# Patient Record
Sex: Female | Born: 1950
Health system: Southern US, Community
[De-identification: ages and names within clinical notes are randomized; demographics above are authoritative.]

## PROBLEM LIST (undated history)

## (undated) DIAGNOSIS — R7303 Prediabetes: Secondary | ICD-10-CM

## (undated) DIAGNOSIS — E785 Hyperlipidemia, unspecified: Secondary | ICD-10-CM

## (undated) DIAGNOSIS — F32A Depression, unspecified: Secondary | ICD-10-CM

## (undated) DIAGNOSIS — E079 Disorder of thyroid, unspecified: Secondary | ICD-10-CM

## (undated) DIAGNOSIS — E559 Vitamin D deficiency, unspecified: Secondary | ICD-10-CM

## (undated) DIAGNOSIS — C801 Malignant (primary) neoplasm, unspecified: Secondary | ICD-10-CM

## (undated) DIAGNOSIS — K635 Polyp of colon: Secondary | ICD-10-CM

## (undated) DIAGNOSIS — I10 Essential (primary) hypertension: Secondary | ICD-10-CM

## (undated) DIAGNOSIS — D649 Anemia, unspecified: Secondary | ICD-10-CM

## (undated) DIAGNOSIS — F419 Anxiety disorder, unspecified: Secondary | ICD-10-CM

## (undated) DIAGNOSIS — K219 Gastro-esophageal reflux disease without esophagitis: Secondary | ICD-10-CM

## (undated) DIAGNOSIS — F329 Major depressive disorder, single episode, unspecified: Secondary | ICD-10-CM

## (undated) HISTORY — PX: MOHS SURGERY: SHX181

## (undated) HISTORY — DX: Hyperlipidemia, unspecified: E78.5

## (undated) HISTORY — DX: Anemia, unspecified: D64.9

## (undated) HISTORY — DX: Essential (primary) hypertension: I10

## (undated) HISTORY — DX: Major depressive disorder, single episode, unspecified: F32.9

## (undated) HISTORY — DX: Gastro-esophageal reflux disease without esophagitis: K21.9

## (undated) HISTORY — PX: THYROIDECTOMY: SHX17

## (undated) HISTORY — DX: Polyp of colon: K63.5

## (undated) HISTORY — DX: Depression, unspecified: F32.A

## (undated) HISTORY — DX: Disorder of thyroid, unspecified: E07.9

## (undated) HISTORY — DX: Vitamin D deficiency, unspecified: E55.9

## (undated) HISTORY — DX: Malignant (primary) neoplasm, unspecified: C80.1

## (undated) HISTORY — PX: COLONOSCOPY: SHX174

## (undated) HISTORY — PX: TONSILLECTOMY: SUR1361

## (undated) HISTORY — DX: Prediabetes: R73.03

## (undated) HISTORY — PX: BREAST LUMPECTOMY: SHX2

## (undated) HISTORY — DX: Anxiety disorder, unspecified: F41.9

---

## 1992-12-30 HISTORY — PX: VAGINAL HYSTERECTOMY: SUR661

## 1998-07-27 ENCOUNTER — Other Ambulatory Visit: Admission: RE | Admit: 1998-07-27 | Discharge: 1998-07-27 | Payer: Self-pay | Admitting: Obstetrics and Gynecology

## 1998-12-30 HISTORY — PX: KNEE ARTHROSCOPY: SHX127

## 2000-12-30 HISTORY — PX: BREAST EXCISIONAL BIOPSY: SUR124

## 2001-03-05 ENCOUNTER — Ambulatory Visit (HOSPITAL_COMMUNITY): Admission: RE | Admit: 2001-03-05 | Discharge: 2001-03-05 | Payer: Self-pay | Admitting: Family Medicine

## 2001-03-05 ENCOUNTER — Encounter: Payer: Self-pay | Admitting: Internal Medicine

## 2001-03-10 ENCOUNTER — Encounter: Admission: RE | Admit: 2001-03-10 | Discharge: 2001-06-08 | Payer: Self-pay | Admitting: Internal Medicine

## 2001-08-27 ENCOUNTER — Other Ambulatory Visit: Admission: RE | Admit: 2001-08-27 | Discharge: 2001-08-27 | Payer: Self-pay | Admitting: Obstetrics and Gynecology

## 2002-10-14 ENCOUNTER — Encounter: Payer: Self-pay | Admitting: Internal Medicine

## 2002-10-14 ENCOUNTER — Ambulatory Visit (HOSPITAL_COMMUNITY): Admission: RE | Admit: 2002-10-14 | Discharge: 2002-10-14 | Payer: Self-pay | Admitting: Internal Medicine

## 2002-10-19 ENCOUNTER — Ambulatory Visit (HOSPITAL_COMMUNITY): Admission: RE | Admit: 2002-10-19 | Discharge: 2002-10-19 | Payer: Self-pay | Admitting: Internal Medicine

## 2002-10-19 ENCOUNTER — Encounter: Payer: Self-pay | Admitting: Internal Medicine

## 2003-12-31 LAB — HM COLONOSCOPY

## 2004-03-15 ENCOUNTER — Ambulatory Visit (HOSPITAL_COMMUNITY): Admission: RE | Admit: 2004-03-15 | Discharge: 2004-03-15 | Payer: Self-pay | Admitting: Internal Medicine

## 2005-03-22 ENCOUNTER — Ambulatory Visit (HOSPITAL_COMMUNITY): Admission: RE | Admit: 2005-03-22 | Discharge: 2005-03-22 | Payer: Self-pay | Admitting: Internal Medicine

## 2006-03-18 ENCOUNTER — Other Ambulatory Visit: Admission: RE | Admit: 2006-03-18 | Discharge: 2006-03-18 | Payer: Self-pay | Admitting: Obstetrics & Gynecology

## 2006-03-21 ENCOUNTER — Ambulatory Visit (HOSPITAL_COMMUNITY): Admission: RE | Admit: 2006-03-21 | Discharge: 2006-03-21 | Payer: Self-pay | Admitting: Obstetrics and Gynecology

## 2006-03-25 ENCOUNTER — Ambulatory Visit (HOSPITAL_COMMUNITY): Admission: RE | Admit: 2006-03-25 | Discharge: 2006-03-25 | Payer: Self-pay | Admitting: Internal Medicine

## 2007-03-18 IMAGING — US US ABDOMEN COMPLETE
1 series · 13 of 25 positions shown · non-contrast
Comparison: none

CLINICAL DATA: Reported history of hemangioma. Epigastric pain.  Evaluate for gallstones.  
ABDOMINAL ULTRASOUND:
Gallbladder:  Normal.  No stones or gallbladder wall thickening.
Common bile duct:  Normal.  
Liver:  Large hyperechoic lesion is identified in the right hepatic lobe, at the capsule.  This measures 6.2 x 5.3 x 5.7 cm and elongates into a more oval shape on some images.  Reviewing the patient?s CT scan from 10/19/02, this demonstrated a large peripherally enhancing lesion in the posterior right liver (mainly involving segment 7).  A second smaller lesion was noted in the anterior aspect of the inferior right liver.  This lesion at CT is identified on ultrasound today.
Inferior vena cava:  Unremarkable.
Pancreas:  Obscured by overlying bowel gas.  
Spleen:  Normal.
Kidneys:  Normal.
Aorta:  Nondilated.

[Series 1: us abdomen complete · 0.35mm/px · 13 of 90 slices shown]
[im 1/90]
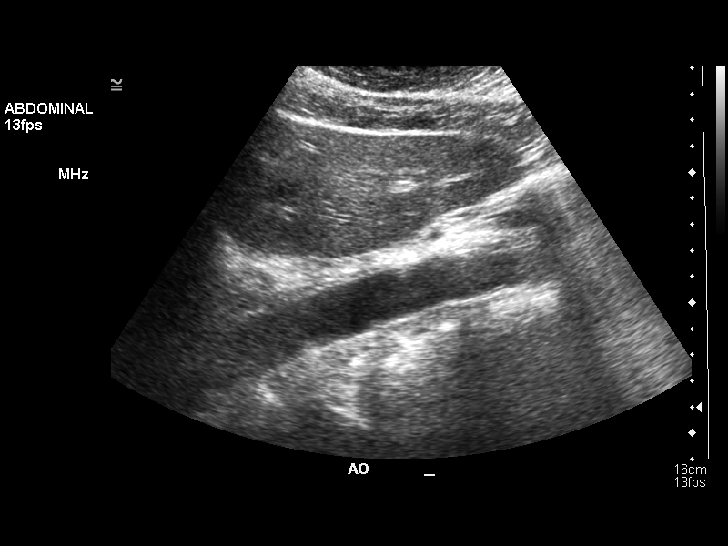
[im 8/90]
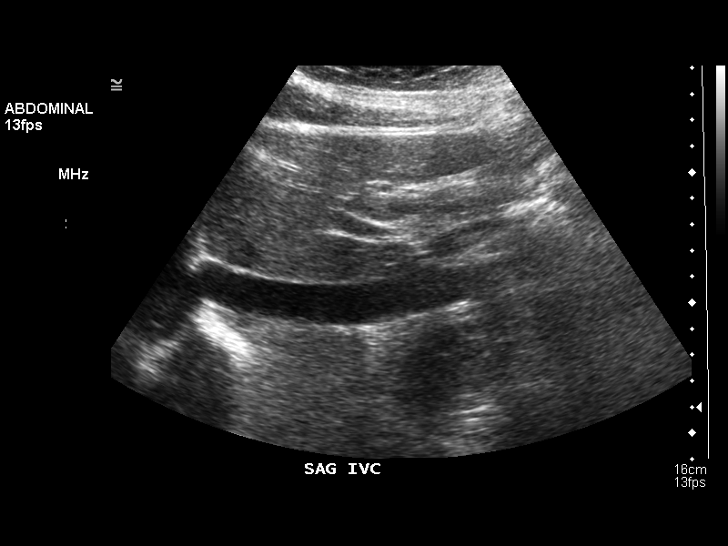
[im 15/90]
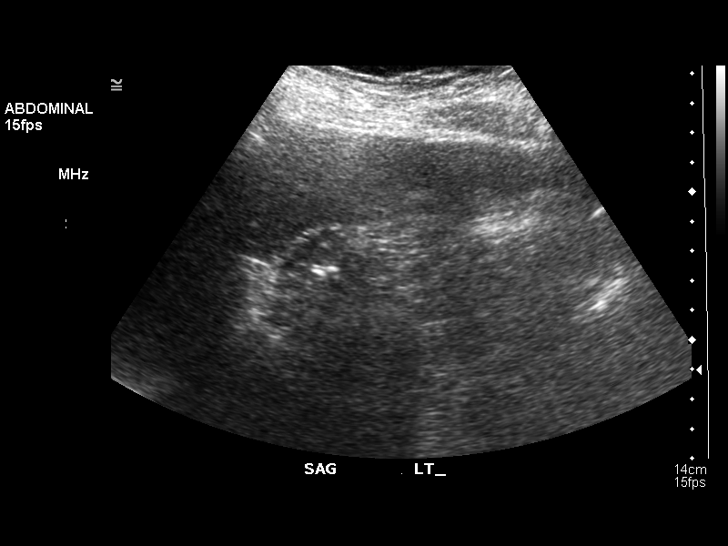
[im 23/90]
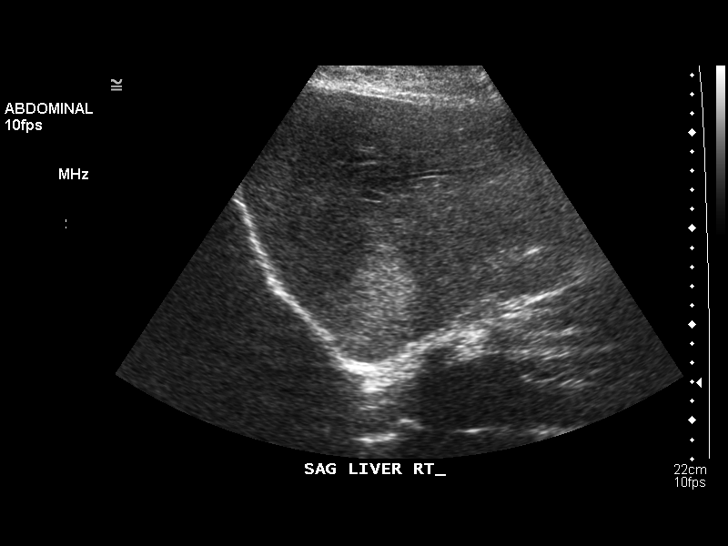
[im 30/90]
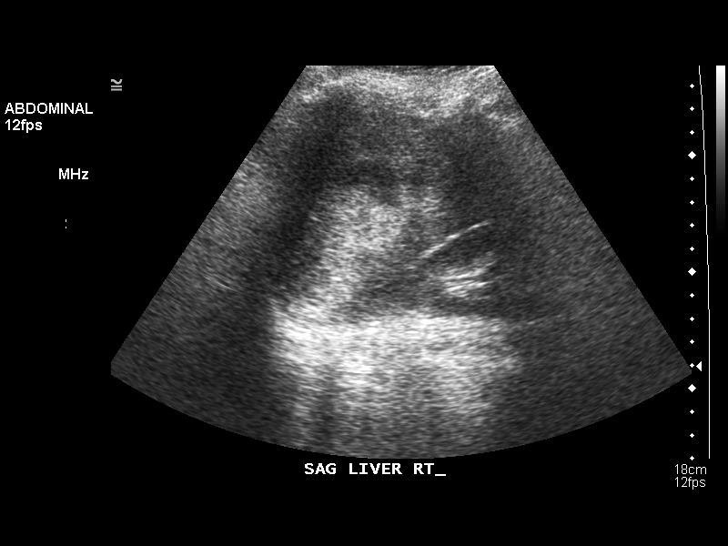
[im 38/90]
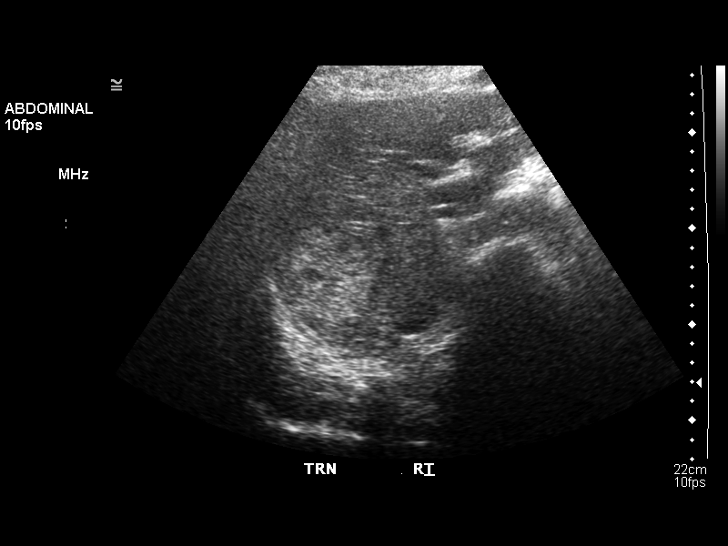
[im 45/90]
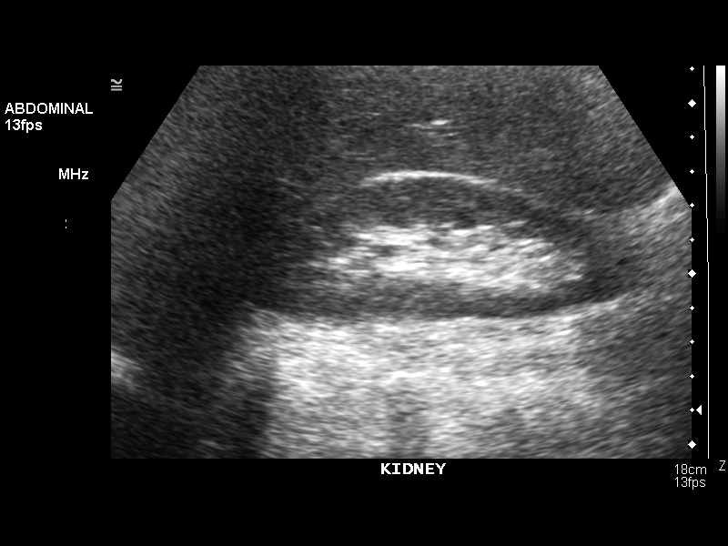
[im 52/90]
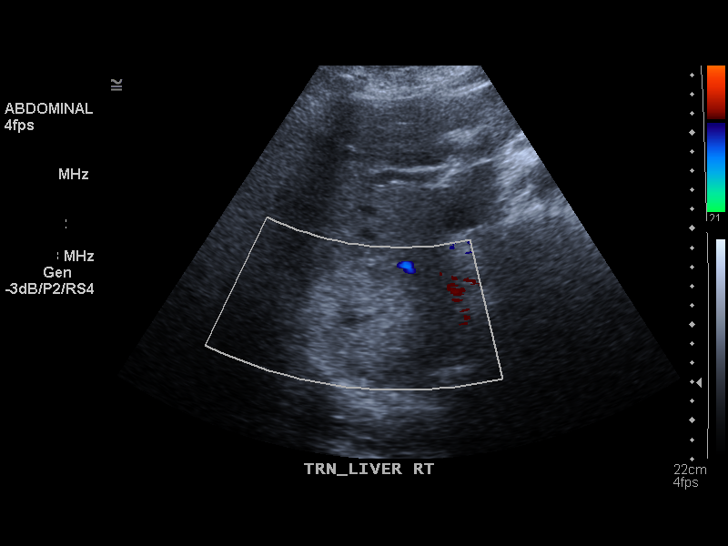
[im 60/90]
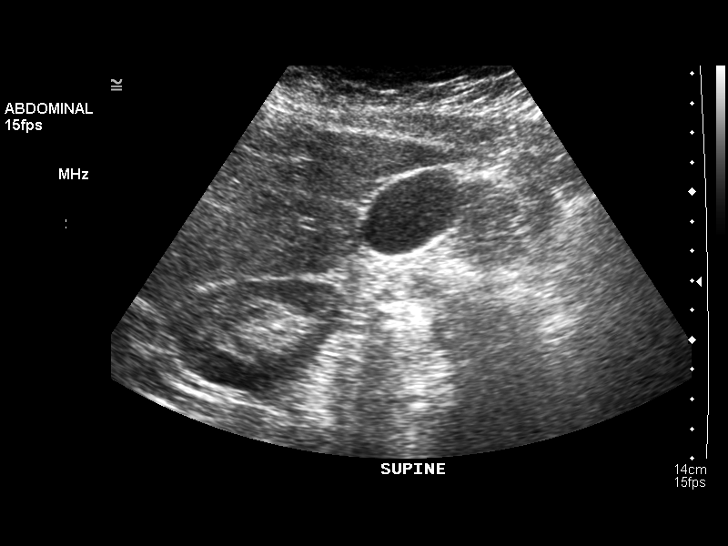
[im 67/90]
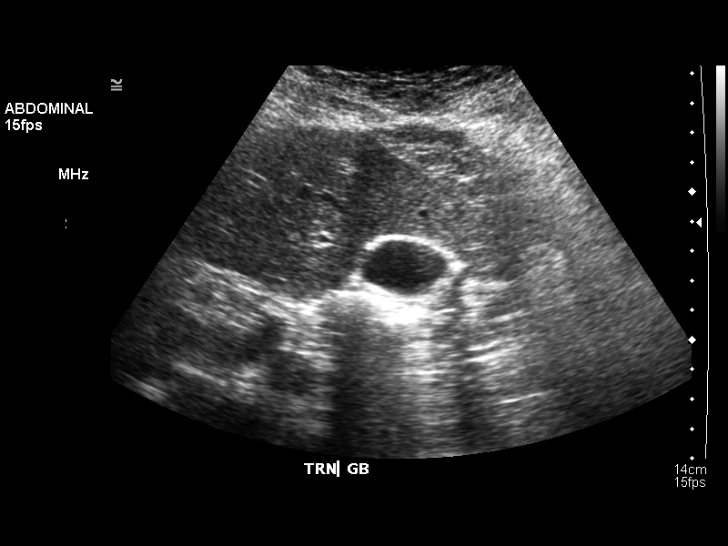
[im 75/90]
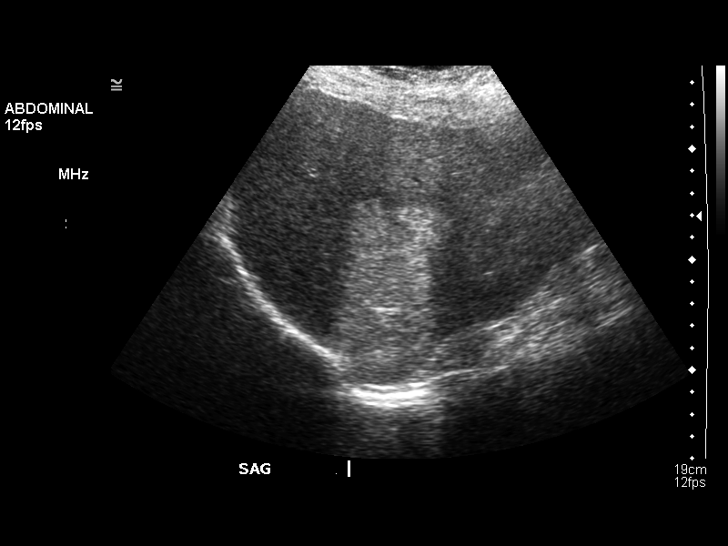
[im 82/90]
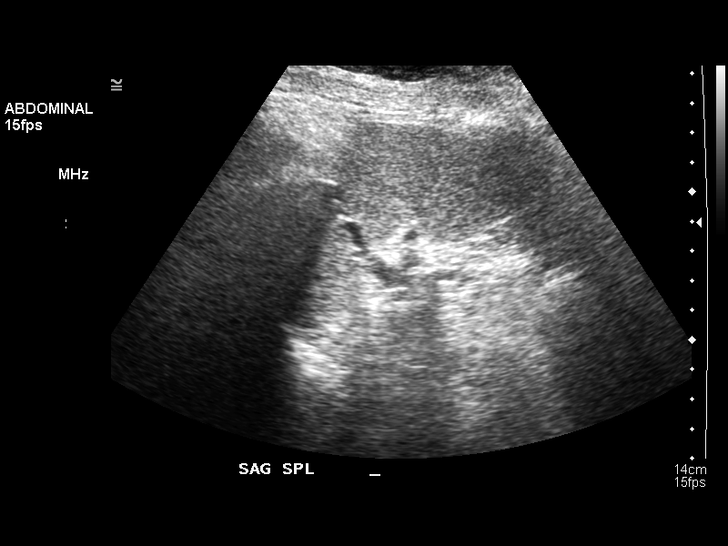
[im 90/90]
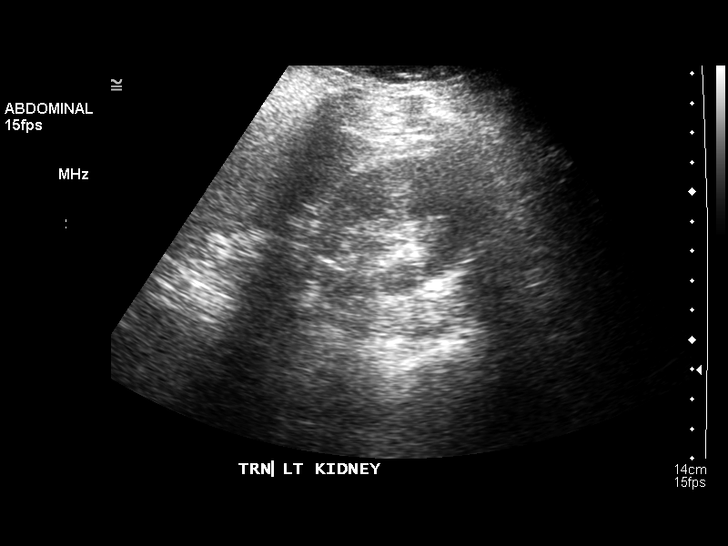

[13 of 25 positions shown; findings below may reference images not displayed]

IMPRESSION: 1.  No evidence for cholelithiasis.  
2.  Large echogenic lesion in the posterior right liver, at the capsule.  Review of the CT scan from 3992 demonstrates peripheral enhancement although not typically as nodular as classically seen for cavernous hemangioma.  Given the relative stability over the 2 ? years, this is most likely a benign etiology such as hemangioma although FNH would also be a consideration.  If the patient is having pain, there have been case reports of giant cavernous hemangioma causing right upper quadrant pain.  dedicated hepatic MRI is recommended as this would probably be the most helpful.

## 2007-08-17 DIAGNOSIS — C4491 Basal cell carcinoma of skin, unspecified: Secondary | ICD-10-CM

## 2007-08-17 HISTORY — DX: Basal cell carcinoma of skin, unspecified: C44.91

## 2009-02-21 ENCOUNTER — Encounter (INDEPENDENT_AMBULATORY_CARE_PROVIDER_SITE_OTHER): Payer: Self-pay | Admitting: *Deleted

## 2009-06-16 ENCOUNTER — Encounter (INDEPENDENT_AMBULATORY_CARE_PROVIDER_SITE_OTHER): Payer: Self-pay | Admitting: General Surgery

## 2009-06-16 ENCOUNTER — Ambulatory Visit (HOSPITAL_COMMUNITY): Admission: RE | Admit: 2009-06-16 | Discharge: 2009-06-16 | Payer: Self-pay | Admitting: General Surgery

## 2011-04-08 LAB — BASIC METABOLIC PANEL
BUN: 11 mg/dL (ref 6–23)
CO2: 28 mEq/L (ref 19–32)
Calcium: 9.2 mg/dL (ref 8.4–10.5)
Chloride: 106 mEq/L (ref 96–112)
Creatinine, Ser: 0.81 mg/dL (ref 0.4–1.2)
Glucose, Bld: 102 mg/dL — ABNORMAL HIGH (ref 70–99)

## 2011-05-17 NOTE — Op Note (Signed)
NAME:  Cheyenne Conway, Cheyenne Conway NO.:  [id number]   MEDICAL RECORD NO.:  [id number]          PATIENT TYPE:  AMB   LOCATION:  DAY                          FACILITY[facility]  [facility]   PHYSICIAN:  Mare Loan, MD      DATE OF BIRTH:  1951/07/10   DATE OF PROCEDURE:  07/16/2009  DATE OF DISCHARGE:                               OPERATIVE REPORT   PREOPERATIVE DIAGNOSIS:  Left nipple discharge, likely papilloma.   POSTOPERATIVE DIAGNOSIS:  Left nipple discharge, likely papilloma.   PROCEDURE:  Excision of left breast tissue.   SURGEON:  Ronnald Collum, M.D.   ASSISTANT:  OR nurse.   ANESTHESIA:  General endotracheal anesthesia.   FINDINGS:  Nipple discharge from the left seen in the upper outer  quadrant.   SPECIMEN:  Left breast tissue.  No immediate complications.  No drains  were placed.   INDICATIONS FOR PROCEDURE:  Ms. Gammon is a 60 year old female who had  spontaneous nipple discharge from the left.  At times it was bloody.  She had a workup at [facility] including ductogram which revealed  the filling defect just beneath the nipple.  No abnormality was seen on  mammogram or ultrasound.  In clinic, I had expressed serosanguineous  fluid from the left breast in the upper outer quadrant.  Due to the  filling defect and the concern for papilloma and the small chance of an  associated carcinoma, informed consent was obtained for excision of left  breast tissue.   DETAILS OF PROCEDURE:  Ms. Demers was identified in the preoperative  holding area.  I examined her left breast and again elicited discharge  from the left breast.  It did seemed like it was coming from both sides  of the breast.  I talked to the patient about performing removal of  tissue just beneath the nipple with the small likelihood of having  decreased sensation in the nipple.  She was taken from the preoperative  holding area to the operating room.  Once in the operating room, she was  placed in the  supine position.  Spontaneous compression devices were  placed on the bilateral lower extremities.  Her left breast was prepped  and draped in the usual sterile fashion.  A time-out procedure  indicating the patient and the procedure was performed.  The left breast  was examined by me and I have asked Dr. Hassell Done to assist.  She had  discharge from the left nipple.  It did seem to come lateral medial.  We  could not identify a prominent duct.  Since I had elicited multiple  times on the lateral aspect of the breast, I performed an incision in  the upper outer segment of the areola.  Using Marcaine, I injected the  tissues.  Using a 15 blade, I incised the nipple.  I then used  electrocautery and performed excision of the breast tissue skiving  underneath the nipple and then performed an excision of the breast  tissue just underneath the nipple.  There did seem to be a prominent  duct just underneath the nipple.  I placed a stitch at this mark.  This  also marked the area just underneath the nipple.  The specimen was  removed intact with a long suture lateral and short superior.  The  specimen was passed off the operative field.  I irrigated the wound bed.  A small amount of bleeding was controlled with electrocautery.  I closed  in layers using 3-0 Vicryl.  The dermis was closed with 3-0 Vicryl. The  skin was closed with 4-0 Monocryl.  I  injected 20 mL of Marcaine into the wound bed.  Steri-Strips were placed  as a final dressing.  The patient was then extubated and transferred to  the Postanesthesia Care Unit in stable condition.  She will be  discharged home on Vicodin #45 and will follow up with me 2 to 3 weeks  and I will call for final pathology.      Mare Loan, MD  Electronically Signed     ALA/MEDQ  D:  06/16/2009  T:  06/16/2009  Job:  [id number]   cc:   Dr. Isaiah Blakes

## 2011-07-04 ENCOUNTER — Encounter: Payer: Self-pay | Admitting: Gastroenterology

## 2011-08-28 ENCOUNTER — Ambulatory Visit: Payer: Self-pay | Admitting: Gastroenterology

## 2011-08-29 ENCOUNTER — Encounter: Payer: Self-pay | Admitting: Gastroenterology

## 2012-02-10 ENCOUNTER — Other Ambulatory Visit: Payer: Self-pay | Admitting: Dermatology

## 2012-05-28 ENCOUNTER — Ambulatory Visit (HOSPITAL_COMMUNITY)
Admission: RE | Admit: 2012-05-28 | Discharge: 2012-05-28 | Disposition: A | Discharge status: Home / Self Care | Payer: Managed Care, Other (non HMO) | Source: Ambulatory Visit | Attending: Physician Assistant | Admitting: Physician Assistant

## 2012-05-28 ENCOUNTER — Other Ambulatory Visit (HOSPITAL_COMMUNITY): Payer: Self-pay | Admitting: Physician Assistant

## 2012-05-28 DIAGNOSIS — I10 Essential (primary) hypertension: Secondary | ICD-10-CM | POA: Insufficient documentation

## 2012-05-28 DIAGNOSIS — Z0000 Encounter for general adult medical examination without abnormal findings: Secondary | ICD-10-CM | POA: Insufficient documentation

## 2012-12-28 ENCOUNTER — Other Ambulatory Visit: Payer: Self-pay | Admitting: Ophthalmology

## 2013-04-06 ENCOUNTER — Other Ambulatory Visit: Payer: Self-pay | Admitting: Dermatology

## 2013-04-06 DIAGNOSIS — C4491 Basal cell carcinoma of skin, unspecified: Secondary | ICD-10-CM

## 2013-04-06 HISTORY — DX: Basal cell carcinoma of skin, unspecified: C44.91

## 2013-06-23 ENCOUNTER — Other Ambulatory Visit: Payer: Self-pay | Admitting: Obstetrics and Gynecology

## 2013-11-19 ENCOUNTER — Other Ambulatory Visit: Payer: Self-pay | Admitting: Physician Assistant

## 2013-11-19 NOTE — Telephone Encounter (Signed)
LMOM (ok to leave messages per patient registration form) at home [phone] and work # [phone] ext [phone] , advised patient 30 refill was sent to mail order, she needs a office visit for any additional refills

## 2013-11-29 ENCOUNTER — Other Ambulatory Visit: Payer: Self-pay | Admitting: Physician Assistant

## 2013-11-30 ENCOUNTER — Encounter: Payer: Self-pay | Admitting: Internal Medicine

## 2013-11-30 DIAGNOSIS — E079 Disorder of thyroid, unspecified: Secondary | ICD-10-CM | POA: Insufficient documentation

## 2013-11-30 DIAGNOSIS — I10 Essential (primary) hypertension: Secondary | ICD-10-CM | POA: Insufficient documentation

## 2013-11-30 DIAGNOSIS — F325 Major depressive disorder, single episode, in full remission: Secondary | ICD-10-CM | POA: Insufficient documentation

## 2013-11-30 DIAGNOSIS — R7309 Other abnormal glucose: Secondary | ICD-10-CM | POA: Insufficient documentation

## 2013-11-30 DIAGNOSIS — K219 Gastro-esophageal reflux disease without esophagitis: Secondary | ICD-10-CM | POA: Insufficient documentation

## 2013-11-30 DIAGNOSIS — E785 Hyperlipidemia, unspecified: Secondary | ICD-10-CM | POA: Insufficient documentation

## 2013-11-30 DIAGNOSIS — E559 Vitamin D deficiency, unspecified: Secondary | ICD-10-CM | POA: Insufficient documentation

## 2013-12-02 ENCOUNTER — Ambulatory Visit: Payer: Managed Care, Other (non HMO) | Admitting: Physician Assistant

## 2013-12-02 ENCOUNTER — Encounter: Payer: Self-pay | Admitting: Physician Assistant

## 2013-12-02 VITALS — BP 128/82 | HR 80 | Temp 97.9°F | Resp 16 | Ht 69.0 in | Wt 194.0 lb

## 2013-12-02 DIAGNOSIS — R7309 Other abnormal glucose: Secondary | ICD-10-CM

## 2013-12-02 DIAGNOSIS — E079 Disorder of thyroid, unspecified: Secondary | ICD-10-CM

## 2013-12-02 DIAGNOSIS — E559 Vitamin D deficiency, unspecified: Secondary | ICD-10-CM

## 2013-12-02 DIAGNOSIS — R7303 Prediabetes: Secondary | ICD-10-CM

## 2013-12-02 DIAGNOSIS — I10 Essential (primary) hypertension: Secondary | ICD-10-CM

## 2013-12-02 DIAGNOSIS — E785 Hyperlipidemia, unspecified: Secondary | ICD-10-CM

## 2013-12-02 DIAGNOSIS — E782 Mixed hyperlipidemia: Secondary | ICD-10-CM

## 2013-12-02 LAB — CBC WITH DIFFERENTIAL/PLATELET
Basophils Absolute: 0 10*3/uL (ref 0.0–0.1)
Eosinophils Relative: 1 % (ref 0–5)
HCT: 38 % (ref 36.0–46.0)
Lymphocytes Relative: 23 % (ref 12–46)
Lymphs Abs: 1.8 10*3/uL (ref 0.7–4.0)
MCV: 84.6 fL (ref 78.0–100.0)
Monocytes Absolute: 0.7 10*3/uL (ref 0.1–1.0)
Neutro Abs: 5.4 10*3/uL (ref 1.7–7.7)
RBC: 4.49 MIL/uL (ref 3.87–5.11)
RDW: 13.9 % (ref 11.5–15.5)
WBC: 7.9 10*3/uL (ref 4.0–10.5)

## 2013-12-02 LAB — HEMOGLOBIN A1C: Hgb A1c MFr Bld: 6.1 % — ABNORMAL HIGH (ref <5.7)

## 2013-12-02 NOTE — Patient Instructions (Addendum)
Start the HCTZ everyother day and monitor your BP.   + MAGNESIUM 400-553m daily- may help Constipation, Adult Constipation is when a person has fewer than 3 bowel movements a week; has difficulty having a bowel movement; or has stools that are dry, hard, or larger than normal. As people grow older, constipation is more common. If you try to fix constipation with medicines that make you have a bowel movement (laxatives), the problem may get worse. Long-term laxative use may cause the muscles of the colon to become weak. A low-fiber diet, not taking in enough fluids, and taking certain medicines may make constipation worse. CAUSES   Certain medicines, such as antidepressants, pain medicine, iron supplements, antacids, and water pills.   Certain diseases, such as diabetes, irritable bowel syndrome (IBS), thyroid disease, or depression.   Not drinking enough water.   Not eating enough fiber-rich foods.   Stress or travel.  Lack of physical activity or exercise.  Not going to the restroom when there is the urge to have a bowel movement.  Ignoring the urge to have a bowel movement.  Using laxatives too much. SYMPTOMS   Having fewer than 3 bowel movements a week.   Straining to have a bowel movement.   Having hard, dry, or larger than normal stools.   Feeling full or bloated.   Pain in the lower abdomen.  Not feeling relief after having a bowel movement. DIAGNOSIS  Your caregiver will take a medical history and perform a physical exam. Further testing may be done for severe constipation. Some tests may include:   A barium enema X-ray to examine your rectum, colon, and sometimes, your small intestine.  A sigmoidoscopy to examine your lower colon.  A colonoscopy to examine your entire colon. TREATMENT Treatment will depend on the severity of your constipation and what is causing it. Some dietary treatments include drinking more fluids and eating more fiber-rich foods.  Lifestyle treatments may include regular exercise. If these diet and lifestyle recommendations do not help, your caregiver may recommend taking over-the-counter laxative medicines to help you have bowel movements. Prescription medicines may be prescribed if over-the-counter medicines do not work.  HOME CARE INSTRUCTIONS   Increase dietary fiber in your diet, such as fruits, vegetables, whole grains, and beans. Limit high-fat and processed sugars in your diet, such as [place]fries, hamburgers, cookies, candies, and soda.   A fiber supplement may be added to your diet if you cannot get enough fiber from foods.   Drink enough fluids to keep your urine clear or pale yellow.    Exercise regularly or as directed by your caregiver.   Go to the restroom when you have the urge to go. Do not hold it.  Only take medicines as directed by your caregiver. Do not take other medicines for constipation without talking to your caregiver first. STasleyIF:   You have bright red blood in your stool.   Your constipation lasts for more than 4 days or gets worse.   You have abdominal or rectal pain.   You have thin, pencil-like stools.  You have unexplained weight loss. MAKE SURE YOU:   Understand these instructions.  Will watch your condition.  Will get help right away if you are not doing well or get worse. Document Released: 09/13/2004 Document Revised: 03/09/2012 Document Reviewed: 11/19/2011 [facility] AssociatesPatient Information 2014 [place]  Varicose Veins Varicose veins are veins that have become enlarged and twisted. CAUSES This condition is the result  of valves in the veins not working properly. Valves in the veins help return blood from the leg to the heart. When your calf muscles squeeze, the blood moves up your leg then the valves close and this continues until the blood gets back to your heart.  If these valves are damaged, blood flows backwards and backs up  into the veins in the leg near the skin OR if your are sitting/standing for a long time without using your calf muscles the blood will back up into the veins in your legs. This causes the veins to become larger. People who are on their feet a lot, sit a lot without walking (like on a plane, at a desk, or in a car), who are pregnant, or who are overweight are more likely to develop varicose veins. SYMPTOMS   Bulging, twisted-appearing, bluish veins, most commonly found on the legs.  Leg pain or a feeling of heaviness. These symptoms may be worse at the end of the day.  Leg swelling.  Skin color changes. DIAGNOSIS  Varicose veins can usually be diagnosed with an exam of your legs by your caregiver. He or she may recommend an ultrasound of your leg veins. TREATMENT  Most varicose veins can be treated at home.However, other treatments are available for people who have persistent symptoms or who want to treat the cosmetic appearance of the varicose veins. But this is only cosmetic and they will return if not properly treated. These include:  Laser treatment of very small varicose veins.  Medicine that is shot (injected) into the vein. This medicine hardens the walls of the vein and closes off the vein. This treatment is called sclerotherapy. Afterwards, you may need to wear clothing or bandages that apply pressure.  Surgery. HOME CARE INSTRUCTIONS   Do not stand or sit in one position for long periods of time. Do not sit with your legs crossed. Rest with your legs raised during the day.  Your legs have to be higher than your heart so that gravity will force the valves to open, so please really elevate your legs.   Wear elastic stockings or support hose. Do not wear other tight, encircling garments around the legs, pelvis, or waist.  [employer]  has a wide variety of well priced compression stockings. [place], [place] [address] [phone]  Walk as much as possible to  increase blood flow.  Raise the foot of your bed at night with 2-inch blocks.  If you get a cut in the skin over the vein and the vein bleeds, lie down with your leg raised and press on it with a clean cloth until the bleeding stops. Then place a bandage (dressing) on the cut. See your caregiver if it continues to bleed or needs stitches. SEEK MEDICAL CARE IF:   The skin around your ankle starts to break down.  You have pain, redness, tenderness, or hard swelling developing in your leg over a vein.  You are uncomfortable due to leg pain. Document Released: 09/25/2005 Document Revised: 03/09/2012 Document Reviewed: 02/11/2011 [facility] Patient Information 2014 [place].

## 2013-12-02 NOTE — Progress Notes (Signed)
HPI Patient presents for 3 month follow up with hypertension, hyperlipidemia, prediabetes and vitamin D. Patient's blood pressure has been controlled at home. Patient denies chest pain, shortness of breath, dizziness.  Patient's cholesterol is diet controlled. In addition they are on  and denies myalgias. The cholesterol last visit was LDL 80. The patient has been working on diet and exercise for prediabetes, and denies changes in vision, polys, and paresthesias. A1C was 5.7.  Patient states that her vesicare is helping with incontinence and she saw her OBGYN and is considering getting the cystocele repair. The vesicare is causing constipation but helping with her incontinence.  [name].  Current Medications:    Medication List       This list is accurate as of: 12/02/13  3:30 PM.  Always use your most recent med list.               aspirin 81 MG chewable tablet  Chew by mouth daily.     estradiol 0.5 MG tablet  Commonly known as:  ESTRACE  Take 0.5 mg by mouth daily.     ezetimibe-simvastatin 10-40 MG per tablet  Commonly known as:  VYTORIN  Take 1 tablet by mouth daily.     hydrochlorothiazide 25 MG tablet  Commonly known as:  HYDRODIURIL  TAKE 1 TABLET DAILY     levothyroxine 100 MCG tablet  Commonly known as:  SYNTHROID, LEVOTHROID  TAKE 1 TABLET DAILY     losartan 100 MG tablet  Commonly known as:  COZAAR  Take 100 mg by mouth daily.     MULTIVITAMIN PO  Take by mouth daily.     omeprazole 20 MG capsule  Commonly known as:  PRILOSEC  Take 20 mg by mouth daily.     OVER THE COUNTER MEDICATION  Tumeric daily     VESICARE PO  Take 25 mg by mouth daily.     VITAMIN D PO  Take 1,000 Int'l Units by mouth daily.     WELLBUTRIN XL 150 MG 24 hr tablet  Generic drug:  buPROPion  Take 150 mg by mouth daily.        Medical History:  Past Medical History  Diagnosis Date  . Colon polyp, hyperplastic     2005  . Unspecified  essential hypertension   . Hyperlipidemia   . Anemia   . Thyroid disease   . Prediabetes   . GERD (gastroesophageal reflux disease)   . Depression   . Vitamin D deficiency    Allergies:  Allergies  Allergen Reactions  . Lotensin [Benazepril Hcl]   . Naldecon Senior [Guaifenesin]   . Prednisone     ROS Constitutional: Denies fever, chills, weight loss/gain, headaches, insomnia, fatigue, night sweats, and change in appetite. Eyes: Denies redness, blurred vision, diplopia, discharge, itchy, watery eyes.  ENT: Denies discharge, congestion, post nasal drip, sore throat, earache, dental pain, Tinnitus, Vertigo, Sinus pain, snoring.  Cardio: Denies chest pain, palpitations, irregular heartbeat,  dyspnea, diaphoresis, orthopnea, PND, claudication, edema Respiratory: denies cough, dyspnea,pleurisy, hoarseness, wheezing.  Gastrointestinal: Denies dysphagia, heartburn,  water brash, pain, cramps, nausea, vomiting, bloating, diarrhea, constipation, hematemesis, melena, hematochezia,  hemorrhoids Genitourinary: Denies dysuria, frequency, urgency, nocturia, hesitancy, discharge, hematuria, flank pain Musculoskeletal: Denies arthralgia, myalgia, stiffness, Jt. Swelling, pain, limp, and strain/sprain. Skin: Denies pruritis, rash, hives, warts, acne, eczema, changing in skin lesion Neuro: Denies Weakness, tremor, incoordination, spasms, paresthesia, pain Psychiatric: Denies confusion, memory loss, sensory loss Endocrine: Denies change in weight,  skin, hair change, nocturia, and paresthesia, Diabetic Polys, Denies visual blurring, hyper /hypo glycemic episodes.  Heme/Lymph: Denies Excessive bleeding, bruising, enlarged lymph nodes  Family history- Review and unchanged Social history- Review and unchanged Physical Exam: Filed Vitals:   12/02/13 1508  BP: 128/82  Pulse: 80  Temp: 97.9 F (36.6 C)  Resp: 16   Filed Weights   12/02/13 1508  Weight: 194 lb (87.998 kg)   General Appearance:  Well nourished, in no apparent distress. Eyes: PERRLA, EOMs, conjunctiva no swelling or erythema, normal fundi and vessels. Sinuses: No Frontal/maxillary tenderness ENT/Mouth: Ext aud canals clear, with TMs without erythema, bulging.No erythema, swelling, or exudate on post pharynx.  Tonsils not swollen or erythematous. Hearing normal.  Neck: Supple, thyroid normal.  Respiratory: Respiratory effort normal, BS equal bilaterally without rales, rhonchi, wheezing or stridor.  Cardio: Heart sounds normal, regular rate and rhythm without murmurs, rubs or gallops. Peripheral pulses brisk and equal bilaterally, without edema.  Abdomen: Flat, soft, with bowel sounds. Non tender, no guarding, rebound, hernias, masses, or organomegaly.  Lymphatics: Non tender without lymphadenopathy.  Musculoskeletal: Full ROM all peripheral extremities, joint stability, 5/5 strength, and normal gait. Skin: Warm, dry without rashes, lesions, ecchymosis.  Neuro: Cranial nerves intact, reflexes equal bilaterally. Normal muscle tone, no cerebellar symptoms. Sensation intact.  Psych: Awake and oriented X 3, normal affect, Insight and Judgment appropriate.   Assessment and Plan:  Hypertension: Continue medication, monitor blood pressure at home. Continue DASH diet. Constipation- increase fiber, water, and exercise, magnesium may help, Decrease HCTZ to every other day and monitor BP.  Incontinence- continue vesicare, myrebetiq too expensive, would suggest considering surgery for cystocele if continues.  Cholesterol: Continue diet and exercise. Check cholesterol.  Pre-diabetes-Continue diet and exercise. Check A1C Vitamin D Def- check level and continue medications.   Vicie Mutters 3:29 PM

## 2013-12-03 LAB — BASIC METABOLIC PANEL WITH GFR
BUN: 14 mg/dL (ref 6–23)
CO2: 29 mEq/L (ref 19–32)
Chloride: 102 mEq/L (ref 96–112)
Creat: 1 mg/dL (ref 0.50–1.10)
GFR, Est Non African American: 61 mL/min
Potassium: 4.2 mEq/L (ref 3.5–5.3)

## 2013-12-03 LAB — HEPATIC FUNCTION PANEL
AST: 15 U/L (ref 0–37)
Alkaline Phosphatase: 66 U/L (ref 39–117)
Indirect Bilirubin: 0.2 mg/dL (ref 0.0–0.9)
Total Bilirubin: 0.3 mg/dL (ref 0.3–1.2)

## 2013-12-03 LAB — LIPID PANEL
HDL: 60 mg/dL (ref >39)
LDL Cholesterol: 106 mg/dL — ABNORMAL HIGH (ref 0–99)
Total CHOL/HDL Ratio: 3.2 Ratio

## 2013-12-03 LAB — TSH: TSH: 0.588 u[IU]/mL (ref 0.350–4.500)

## 2013-12-05 ENCOUNTER — Other Ambulatory Visit: Payer: Self-pay | Admitting: Physician Assistant

## 2014-01-01 ENCOUNTER — Other Ambulatory Visit: Payer: Self-pay | Admitting: Physician Assistant

## 2014-01-02 ENCOUNTER — Other Ambulatory Visit: Payer: Self-pay | Admitting: Physician Assistant

## 2014-01-25 ENCOUNTER — Encounter: Payer: Self-pay | Admitting: Gastroenterology

## 2014-02-16 ENCOUNTER — Other Ambulatory Visit: Payer: Self-pay | Admitting: Dermatology

## 2014-02-26 ENCOUNTER — Encounter: Payer: Self-pay | Admitting: *Deleted

## 2014-03-03 ENCOUNTER — Encounter: Payer: Self-pay | Admitting: Physician Assistant

## 2014-03-03 ENCOUNTER — Ambulatory Visit (INDEPENDENT_AMBULATORY_CARE_PROVIDER_SITE_OTHER): Payer: BC Managed Care – PPO | Admitting: Physician Assistant

## 2014-03-03 VITALS — BP 122/62 | HR 76 | Temp 98.2°F | Resp 16 | Wt 191.0 lb

## 2014-03-03 DIAGNOSIS — R7309 Other abnormal glucose: Secondary | ICD-10-CM

## 2014-03-03 DIAGNOSIS — R32 Unspecified urinary incontinence: Secondary | ICD-10-CM

## 2014-03-03 DIAGNOSIS — E559 Vitamin D deficiency, unspecified: Secondary | ICD-10-CM

## 2014-03-03 DIAGNOSIS — Z1331 Encounter for screening for depression: Secondary | ICD-10-CM

## 2014-03-03 DIAGNOSIS — N393 Stress incontinence (female) (male): Secondary | ICD-10-CM

## 2014-03-03 DIAGNOSIS — E785 Hyperlipidemia, unspecified: Secondary | ICD-10-CM

## 2014-03-03 DIAGNOSIS — E079 Disorder of thyroid, unspecified: Secondary | ICD-10-CM

## 2014-03-03 DIAGNOSIS — R7303 Prediabetes: Secondary | ICD-10-CM

## 2014-03-03 DIAGNOSIS — Z79899 Other long term (current) drug therapy: Secondary | ICD-10-CM

## 2014-03-03 DIAGNOSIS — I10 Essential (primary) hypertension: Secondary | ICD-10-CM

## 2014-03-03 LAB — CBC WITH DIFFERENTIAL/PLATELET
Basophils Absolute: 0 10*3/uL (ref 0.0–0.1)
Basophils Relative: 0 % (ref 0–1)
Eosinophils Absolute: 0.1 10*3/uL (ref 0.0–0.7)
Eosinophils Relative: 1 % (ref 0–5)
HEMATOCRIT: 38.4 % (ref 36.0–46.0)
Hemoglobin: 13.2 g/dL (ref 12.0–15.0)
LYMPHS PCT: 28 % (ref 12–46)
Lymphs Abs: 2.1 10*3/uL (ref 0.7–4.0)
MCH: 30.1 pg (ref 26.0–34.0)
MCHC: 34.4 g/dL (ref 30.0–36.0)
MCV: 87.7 fL (ref 78.0–100.0)
MONO ABS: 0.8 10*3/uL (ref 0.1–1.0)
Monocytes Relative: 11 % (ref 3–12)
NEUTROS ABS: 4.4 10*3/uL (ref 1.7–7.7)
Neutrophils Relative %: 60 % (ref 43–77)
Platelets: 257 10*3/uL (ref 150–400)
RBC: 4.38 MIL/uL (ref 3.87–5.11)
RDW: 13.6 % (ref 11.5–15.5)
WBC: 7.4 10*3/uL (ref 4.0–10.5)

## 2014-03-03 MED ORDER — ESTRADIOL 0.5 MG PO TABS: ORAL_TABLET | ORAL | Status: DC

## 2014-03-03 MED ORDER — BUPROPION HCL ER (XL) 150 MG PO TB24: 150.0000 mg | ORAL_TABLET | Freq: Every day | ORAL | Status: DC

## 2014-03-03 MED ORDER — LOSARTAN POTASSIUM 100 MG PO TABS: 100.0000 mg | ORAL_TABLET | Freq: Every day | ORAL | Status: DC

## 2014-03-03 MED ORDER — OMEPRAZOLE 20 MG PO CPDR: DELAYED_RELEASE_CAPSULE | ORAL | Status: DC

## 2014-03-03 MED ORDER — MIRABEGRON ER 50 MG PO TB24: 50.0000 mg | ORAL_TABLET | Freq: Every day | ORAL | Status: DC

## 2014-03-03 MED ORDER — EZETIMIBE-SIMVASTATIN 10-40 MG PO TABS: ORAL_TABLET | ORAL | Status: DC

## 2014-03-03 NOTE — Patient Instructions (Signed)
Bad carbs also include fruit juice, alcohol, and sweet tea. These are empty calories that do not signal to your brain that you are full.   Please remember the good carbs are still carbs which convert into sugar. So please measure them out no more than 1/2-1 cup of rice, oatmeal, pasta, and beans.  Veggies are however free foods! Pile them on.   I like lean protein at every meal such as chicken, [place], pork chops, cottage cheese, etc. Just do not fry these meats and please center your meal around vegetable, the meats should be a side dish.   No all fruit is created equal. Please see the list below, the fruit at the bottom is higher in sugars than the fruit at the top   Urinary Incontinence Urinary incontinence is the involuntary loss of urine from your bladder. CAUSES  There are many causes of urinary incontinence. They include:  Medicines.  Infections.  Prostatic enlargement, leading to overflow of urine from your bladder.  Surgery.  Neurological diseases.  Emotional factors. SIGNS AND SYMPTOMS Urinary Incontinence can be divided into four types: 1. Urge incontinence. Urge incontinence is the involuntary loss of urine before you have the opportunity to go to the bathroom. There is a sudden urge to void but not enough time to reach a bathroom. 2. Stress incontinence. Stress incontinence is the sudden loss of urine with any activity that forces urine to pass. It is commonly caused by anatomical changes to the pelvis and sphincter areas of your body. 3. Overflow incontinence. Overflow incontinence is the loss of urine from an obstructed opening to your bladder. This results in a backup of urine and a resultant buildup of pressure within the bladder. When the pressure within the bladder exceeds the closing pressure of the sphincter, the urine overflows, which causes incontinence, similar to water overflowing a dam. 4. Total incontinence. Total incontinence is the loss of urine as a  result of the inability to store urine within your bladder. DIAGNOSIS  Evaluating the cause of incontinence may require:  A thorough and complete medical and obstetric history.  A complete physical exam.  Laboratory tests such as a urine culture and sensitivities. When additional tests are indicated, they can include:  An ultrasound exam.  Kidney and bladder X-rays.  Cystoscopy. This is an exam of the bladder using a narrow scope.  Urodynamic testing to test the nerve function to the bladder and sphincter areas. TREATMENT  Treatment for urinary incontinence depends on the cause:  For urge incontinence caused by a bacterial infection, antibiotics will be prescribed. If the urge incontinence is related to medicines you take, your health care provider may have you change the medicine.  For stress incontinence, surgery to re-establish anatomical support to the bladder or sphincter, or both, will often correct the condition.  For overflow incontinence caused by an enlarged prostate, an operation to open the channel through the enlarged prostate will allow the flow of urine out of the bladder. In women with fibroids, a hysterectomy may be recommended.  For total incontinence, surgery on your urinary sphincter may help. An artificial urinary sphincter (an inflatable cuff placed around the urethra) may be required. In women who have developed a hole-like passage between their bladder and vagina (vesicovaginal fistula), surgery to close the fistula often is required. HOME CARE INSTRUCTIONS  Normal daily hygiene and the use of pads or adult diapers that are changed regularly will help prevent odors and skin damage.  Avoid caffeine. It  can overstimulate your bladder.  Use the bathroom regularly. Try about every 2 3 hours to go to the bathroom, even if you do not feel the need to do so. Take time to empty your bladder completely. After urinating, wait a minute. Then try to urinate again.  For  causes involving nerve dysfunction, keep a log of the medicines you take and a journal of the times you go to the bathroom. SEEK MEDICAL CARE IF:  You experience worsening of pain instead of improvement in pain after your procedure.  Your incontinence becomes worse instead of better. SEE IMMEDIATE MEDICAL CARE IF:  You experience fever or shaking chills.  You are unable to pass your urine.  You have redness spreading into your groin or down into your thighs. MAKE SURE YOU:   Understand these instructions.   Will watch your condition.  Will get help right away if you are not doing well or get worse. Document Released: 01/23/2005 Document Revised: 10/06/2013 Document Reviewed: 05/25/2013 [facility] Patient Information 2014 [place].

## 2014-03-03 NOTE — Progress Notes (Signed)
HPI 63 y.o. female  presents for 3 month follow up with hypertension, hyperlipidemia, prediabetes and vitamin D. Her blood pressure has been controlled at home, today their BP is BP: 122/62 mmHg She does workout, kick boxing 2 times a week and ellipitical. She denies chest pain, shortness of breath, dizziness.  She is on cholesterol medication and denies myalgias. Her cholesterol is at goal. The cholesterol last visit was:   Lab Results  Component Value Date   CHOL 194 12/02/2013   HDL 60 12/02/2013   LDLCALC 106* 12/02/2013   TRIG 141 12/02/2013   CHOLHDL 3.2 12/02/2013   She has been working on diet and exercise for prediabetes, and denies foot ulcerations, hyperglycemia, paresthesia of the feet, polydipsia, polyuria and visual disturbances. Last A1C in the office was:  Lab Results  Component Value Date   HGBA1C 6.1* 12/02/2013   [name].   Changing insurance, needs refills on all of her medications.   Current Medications:  Current Outpatient Prescriptions on File Prior to Visit  Medication Sig Dispense Refill  . aspirin 81 MG chewable tablet Chew by mouth daily.      Marland Kitchen buPROPion (WELLBUTRIN XL) 150 MG 24 hr tablet Take 150 mg by mouth daily.      . Cholecalciferol (VITAMIN D PO) Take 1,000 Int'l Units by mouth daily.      Marland Kitchen estradiol (ESTRACE) 0.5 MG tablet TAKE 1 TABLET DAILY  90 tablet  3  . hydrochlorothiazide (HYDRODIURIL) 25 MG tablet TAKE 1 TABLET DAILY  30 tablet  0  . levothyroxine (SYNTHROID, LEVOTHROID) 100 MCG tablet TAKE 1 TABLET DAILY  90 tablet  0  . losartan (COZAAR) 100 MG tablet Take 100 mg by mouth daily.      . Multiple Vitamins-Minerals (MULTIVITAMIN PO) Take by mouth daily.      Marland Kitchen omeprazole (PRILOSEC) 20 MG capsule TAKE 1 CAPSULE DAILY (NEED OFFICE VISIT)  90 capsule  0  . OVER THE COUNTER MEDICATION Tumeric daily      . Solifenacin Succinate (VESICARE PO) Take 25 mg by mouth daily.      Marland Kitchen VYTORIN 10-40 MG per tablet TAKE 1 TABLET DAILY   90 tablet  0   No current facility-administered medications on file prior to visit.   Medical History:  Past Medical History  Diagnosis Date  . Colon polyp, hyperplastic     2005  . Unspecified essential hypertension   . Hyperlipidemia   . Anemia   . Thyroid disease   . Prediabetes   . GERD (gastroesophageal reflux disease)   . Depression   . Vitamin D deficiency    Allergies:  Allergies  Allergen Reactions  . Lotensin [Benazepril Hcl]   . Naldecon Senior [Guaifenesin]   . Prednisone      Review of Systems: [X]  = complains of  [ ]  = denies  General: Fatigue [ ]  Fever [ ]  Chills [ ]  Weakness [ ]   Insomnia [ ]  Eyes: Redness [ ]  Blurred vision [ ]  Diplopia [ ]   ENT: Congestion [ ]  Sinus Pain [ ]  Post Nasal Drip [ ]  Sore Throat [ ]  Earache [ ]   Cardiac: Chest pain/pressure [ ]  SOB [ ]  Orthopnea [ ]   Palpitations [ ]   Paroxysmal nocturnal dyspnea[ ]  Claudication [ ]  Edema [ ]   Pulmonary: Cough [ ]  Wheezing[ ]   SOB [ ]   Snoring [ ]   GI: Nausea [ ]  Vomiting[ ]  Dysphagia[ ]  Heartburn[ ]  Abdominal pain [ ]   Constipation [ ] ; Diarrhea [ ] ; BRBPR [ ]  Melena[ ]  GU: Hematuria[ ]  Dysuria [ ]  Nocturia[ ]  Urgency [ ]   Hesitancy [ ]  Discharge [ ]  Neuro: Headaches[ ]  Vertigo[ ]  Paresthesias[ ]  Spasm [ ]  Speech changes [ ]  Incoordination [ ]   Ortho: Arthritis [ ]  Joint pain [ ]  Muscle pain [ ]  Joint swelling [ ]  Back Pain [ ]  Skin:  Rash [ ]   Pruritis [ ]  Change in skin lesion [ ]   Psych: Depression[ ]  Anxiety[ ]  Confusion [ ]  Memory loss [ ]   Heme/Lypmh: Bleeding [ ]  Bruising [ ]  Enlarged lymph nodes [ ]   Endocrine: Visual blurring [ ]  Paresthesia [ ]  Polyuria [ ]  Polydypsea [ ]    Heat/cold intolerance [ ]  Hypoglycemia [ ]   Family history- Review and unchanged Social history- Review and unchanged Physical Exam: Filed Vitals:   03/03/14 1523  BP: 122/62  Pulse: 76  Temp: 98.2 F (36.8 C)  Resp: 16   Wt Readings from Last 3 Encounters:  03/03/14 191 lb (86.637 kg)  12/02/13 194 lb  (87.998 kg)   General Appearance: Well nourished, in no apparent distress. Eyes: PERRLA, EOMs, conjunctiva no swelling or erythema Sinuses: No Frontal/maxillary tenderness ENT/Mouth: Ext aud canals clear, TMs without erythema, bulging. No erythema, swelling, or exudate on post pharynx.  Tonsils not swollen or erythematous. Hearing normal.  Neck: Supple, thyroid normal.  Respiratory: Respiratory effort normal, BS equal bilaterally without rales, rhonchi, wheezing or stridor.  Cardio: RRR with no MRGs. Brisk peripheral pulses without edema.  Abdomen: Soft, + BS.  Non tender, no guarding, rebound, hernias, masses. Lymphatics: Non tender without lymphadenopathy.  Musculoskeletal: Full ROM, 5/5 strength, normal gait.  Skin: Warm, dry without rashes, lesions, ecchymosis.  Neuro: Cranial nerves intact. Normal muscle tone, no cerebellar symptoms. Sensation intact.  Psych: Awake and oriented X 3, normal affect, Insight and Judgment appropriate.   Assessment and Plan:  Hypertension: Continue medication, monitor blood pressure at home.  Continue DASH diet. Cholesterol: Continue diet and exercise. Check cholesterol.  Pre-diabetes-Continue diet and exercise. Check A1C Vitamin D Def- check level and continue medications.  Medication review and renewal OAB/Incontinence- Myrebetriq 50  Continue diet and meds as discussed. Further disposition pending results of labs.  Vicie Mutters 3:49 PM

## 2014-03-04 LAB — HEMOGLOBIN A1C
Hgb A1c MFr Bld: 5.4 % (ref <5.7)
Mean Plasma Glucose: 108 mg/dL (ref <117)

## 2014-03-04 LAB — BASIC METABOLIC PANEL WITH GFR
BUN: 14 mg/dL (ref 6–23)
CO2: 27 mEq/L (ref 19–32)
Calcium: 9.4 mg/dL (ref 8.4–10.5)
Chloride: 104 mEq/L (ref 96–112)
Creat: 0.95 mg/dL (ref 0.50–1.10)
GFR, EST NON AFRICAN AMERICAN: 65 mL/min
GFR, Est African American: 75 mL/min
Glucose, Bld: 95 mg/dL (ref 70–99)
POTASSIUM: 4.3 meq/L (ref 3.5–5.3)
Sodium: 140 mEq/L (ref 135–145)

## 2014-03-04 LAB — HEPATIC FUNCTION PANEL
ALK PHOS: 58 U/L (ref 39–117)
ALT: 16 U/L (ref 0–35)
AST: 15 U/L (ref 0–37)
Albumin: 4.3 g/dL (ref 3.5–5.2)
BILIRUBIN INDIRECT: 0.2 mg/dL (ref 0.2–1.2)
BILIRUBIN TOTAL: 0.3 mg/dL (ref 0.2–1.2)
Bilirubin, Direct: 0.1 mg/dL (ref 0.0–0.3)
Total Protein: 6.8 g/dL (ref 6.0–8.3)

## 2014-03-04 LAB — INSULIN, FASTING: Insulin fasting, serum: 20 u[IU]/mL (ref 3–28)

## 2014-03-04 LAB — LIPID PANEL
CHOL/HDL RATIO: 3.2 ratio
Cholesterol: 196 mg/dL (ref 0–200)
HDL: 62 mg/dL (ref >39)
LDL CALC: 103 mg/dL — AB (ref 0–99)
Triglycerides: 153 mg/dL — ABNORMAL HIGH (ref <150)
VLDL: 31 mg/dL (ref 0–40)

## 2014-03-04 LAB — TSH: TSH: 1.281 u[IU]/mL (ref 0.350–4.500)

## 2014-03-04 LAB — VITAMIN D 25 HYDROXY (VIT D DEFICIENCY, FRACTURES): Vit D, 25-Hydroxy: 59 ng/mL (ref 30–89)

## 2014-03-04 LAB — MAGNESIUM: Magnesium: 1.9 mg/dL (ref 1.5–2.5)

## 2014-03-14 ENCOUNTER — Encounter: Payer: Self-pay | Admitting: Gastroenterology

## 2014-04-19 ENCOUNTER — Ambulatory Visit (AMBULATORY_SURGERY_CENTER): Payer: Self-pay | Admitting: *Deleted

## 2014-04-19 VITALS — Ht 68.5 in | Wt 184.6 lb

## 2014-04-19 DIAGNOSIS — Z1211 Encounter for screening for malignant neoplasm of colon: Secondary | ICD-10-CM

## 2014-04-19 MED ORDER — NA SULFATE-K SULFATE-MG SULF 17.5-3.13-1.6 GM/177ML PO SOLN: ORAL | Status: DC

## 2014-04-19 NOTE — Progress Notes (Signed)
No egg or soy allergy  No home oxygen use or problems with anesthesia  No medications for weight loss taken  emmi information given

## 2014-04-20 ENCOUNTER — Encounter: Payer: Self-pay | Admitting: Gastroenterology

## 2014-05-03 ENCOUNTER — Encounter: Payer: Self-pay | Admitting: Gastroenterology

## 2014-05-03 ENCOUNTER — Ambulatory Visit (AMBULATORY_SURGERY_CENTER): Payer: BC Managed Care – PPO | Admitting: Gastroenterology

## 2014-05-03 VITALS — BP 132/77 | HR 68 | Temp 97.8°F | Resp 26 | Ht 68.5 in | Wt 184.0 lb

## 2014-05-03 DIAGNOSIS — Z1211 Encounter for screening for malignant neoplasm of colon: Secondary | ICD-10-CM

## 2014-05-03 MED ORDER — SODIUM CHLORIDE 0.9 % IV SOLN: 500.0000 mL | INTRAVENOUS | Status: DC

## 2014-05-03 NOTE — Progress Notes (Signed)
  [facility] Post-op Note  Patient: Cheyenne Conway  Procedure(s) Performed: colonoscopy  Patient Location: [facility] - Recovery Area  Anesthesia Type: Deep Sedation/Propofol  Level of Consciousness: awake, oriented and patient cooperative  Airway and Oxygen Therapy: Patient Spontanous Breathing  Post-op Pain: none  Post-op Assessment:  Post-op Vital signs reviewed, Patient's Cardiovascular Status Stable, Respiratory Function Stable, Patent Airway, No signs of Nausea or vomiting and Pain level controlled  Post-op Vital Signs: Reviewed and stable  Complications: No apparent anesthesia complications  Henrine Hayter E Neko Boyajian 8:25 AM

## 2014-05-03 NOTE — Patient Instructions (Signed)
Discharge instructions given with verbal understanding. Normal exam. Resume previous medications. YOU HAD AN ENDOSCOPIC PROCEDURE TODAY AT THE [facility]: Refer to the procedure report that was given to you for any specific questions about what was found during the examination.  If the procedure report does not answer your questions, please call your gastroenterologist to clarify.  If you requested that your care partner not be given the details of your procedure findings, then the procedure report has been included in a sealed envelope for you to review at your convenience later.  YOU SHOULD EXPECT: Some feelings of bloating in the abdomen. Passage of more gas than usual.  Walking can help get rid of the air that was put into your GI tract during the procedure and reduce the bloating. If you had a lower endoscopy (such as a colonoscopy or flexible sigmoidoscopy) you may notice spotting of blood in your stool or on the toilet paper. If you underwent a bowel prep for your procedure, then you may not have a normal bowel movement for a few days.  DIET: Your first meal following the procedure should be a light meal and then it is ok to progress to your normal diet.  A half-sandwich or bowl of soup is an example of a good first meal.  Heavy or fried foods are harder to digest and may make you feel nauseous or bloated.  Likewise meals heavy in dairy and vegetables can cause extra gas to form and this can also increase the bloating.  Drink plenty of fluids but you should avoid alcoholic beverages for 24 hours.  ACTIVITY: Your care partner should take you home directly after the procedure.  You should plan to take it easy, moving slowly for the rest of the day.  You can resume normal activity the day after the procedure however you should NOT DRIVE or use heavy machinery for 24 hours (because of the sedation medicines used during the test).    SYMPTOMS TO REPORT IMMEDIATELY: A gastroenterologist  can be reached at any hour.  During normal business hours, 8:30 AM to 5:00 PM Monday through Friday, call [phone].  After hours and on weekends, please call the GI answering service at [phone] who will take a message and have the physician on call contact you.   Following lower endoscopy (colonoscopy or flexible sigmoidoscopy):  Excessive amounts of blood in the stool  Significant tenderness or worsening of abdominal pains  Swelling of the abdomen that is new, acute  Fever of 100F or higher  FOLLOW UP: If any biopsies were taken you will be contacted by phone or by letter within the next 1-3 weeks.  Call your gastroenterologist if you have not heard about the biopsies in 3 weeks.  Our staff will call the home number listed on your records the next business day following your procedure to check on you and address any questions or concerns that you may have at that time regarding the information given to you following your procedure. This is a courtesy call and so if there is no answer at the home number and we have not heard from you through the emergency physician on call, we will assume that you have returned to your regular daily activities without incident.  SIGNATURES/CONFIDENTIALITY: You and/or your care partner have signed paperwork which will be entered into your electronic medical record.  These signatures attest to the fact that that the information above on your After Visit Summary has been reviewed   and is understood.  Full responsibility of the confidentiality of this discharge information lies with you and/or your care-partner. 

## 2014-05-03 NOTE — Op Note (Signed)
[facility]  [address]. [place], [address]   COLONOSCOPY PROCEDURE REPORT  PATIENT: Cheyenne, Conway  MR#: [id number] BIRTHDATE: [birth year] , 62  yrs. old GENDER: Female ENDOSCOPIST: Inda Castle, MD REFERRED [id number] Melford Aase, M.D. PROCEDURE DATE:  05/03/2014 PROCEDURE:   Colonoscopy, diagnostic First Screening Colonoscopy - Avg.  risk and is 50 yrs.  old or older - No.  Prior Negative Screening - Now for repeat screening. 10 or more years since last screening  History of Adenoma - Now for follow-up colonoscopy & has been > or = to 3 yrs.  N/A  Polyps Removed Today? No.  Recommend repeat exam, <10 yrs? No. ASA CLASS:   Class II INDICATIONS:Average risk patient for colon cancer. MEDICATIONS: MAC sedation, administered by CRNA and propofol (Diprivan) 377m IV  DESCRIPTION OF PROCEDURE:   After the risks benefits and alternatives of the procedure were thoroughly explained, informed consent was obtained.  A digital rectal exam revealed no abnormalities of the rectum.   The LB [id number] endoscope was introduced through the anus and advanced to the cecum, which was identified by both the appendix and ileocecal valve. No adverse events experienced.   The quality of the prep was excellent using Suprep  The instrument was then slowly withdrawn as the colon was fully examined.      COLON FINDINGS: Moderate melanosis was found throughout the entire examined colon.   The colon was otherwise normal.  There was no diverticulosis, inflammation, polyps or cancers unless previously stated.  Retroflexed views revealed no abnormalities. The time to cecum=4 minutes 03 seconds.  Withdrawal time=7 minutes 02 seconds. The scope was withdrawn and the procedure completed. COMPLICATIONS: There were no complications.  ENDOSCOPIC IMPRESSION: 1.   Moderate melanosis was found throughout the entire examined colon 2.   The colon was otherwise  normal  RECOMMENDATIONS: Continue current colorectal screening recommendations for "routine risk" patients with a repeat colonoscopy in 10 years.   eSigned:  RInda Castle MD 05/03/2014 8:24 AM   cc:   PATIENT NAME:  [name], TamasMR#: [id number]

## 2014-05-04 ENCOUNTER — Telehealth: Payer: Self-pay | Admitting: *Deleted

## 2014-05-04 NOTE — Telephone Encounter (Signed)
  Follow up Call-  Call back number 05/03/2014  Post procedure Call Back phone  # [phone]  Permission to leave phone message Yes     Patient questions:  Do you have a fever, pain , or abdominal swelling? no Pain Score  0 *  Have you tolerated food without any problems? yes  Have you been able to return to your normal activities? yes  Do you have any questions about your discharge instructions: Diet   no Medications  no Follow up visit  no  Do you have questions or concerns about your Care? no  Actions: * If pain score is 4 or above: No action needed, pain <4.

## 2014-05-26 ENCOUNTER — Encounter: Payer: Self-pay | Admitting: Internal Medicine

## 2014-06-02 ENCOUNTER — Ambulatory Visit (INDEPENDENT_AMBULATORY_CARE_PROVIDER_SITE_OTHER): Payer: BC Managed Care – PPO | Admitting: Physician Assistant

## 2014-06-02 ENCOUNTER — Encounter: Payer: Self-pay | Admitting: Physician Assistant

## 2014-06-02 VITALS — BP 120/70 | HR 88 | Temp 98.1°F | Resp 16 | Ht 69.0 in | Wt 183.0 lb

## 2014-06-02 DIAGNOSIS — Z0000 Encounter for general adult medical examination without abnormal findings: Secondary | ICD-10-CM

## 2014-06-02 DIAGNOSIS — R32 Unspecified urinary incontinence: Secondary | ICD-10-CM

## 2014-06-02 DIAGNOSIS — Z79899 Other long term (current) drug therapy: Secondary | ICD-10-CM

## 2014-06-02 DIAGNOSIS — Z23 Encounter for immunization: Secondary | ICD-10-CM

## 2014-06-02 DIAGNOSIS — E559 Vitamin D deficiency, unspecified: Secondary | ICD-10-CM

## 2014-06-02 LAB — CBC WITH DIFFERENTIAL/PLATELET
BASOS ABS: 0 10*3/uL (ref 0.0–0.1)
BASOS PCT: 0 % (ref 0–1)
EOS PCT: 1 % (ref 0–5)
Eosinophils Absolute: 0.1 10*3/uL (ref 0.0–0.7)
HEMATOCRIT: 40.1 % (ref 36.0–46.0)
Hemoglobin: 13.5 g/dL (ref 12.0–15.0)
Lymphocytes Relative: 27 % (ref 12–46)
Lymphs Abs: 2 10*3/uL (ref 0.7–4.0)
MCH: 29.7 pg (ref 26.0–34.0)
MCHC: 33.7 g/dL (ref 30.0–36.0)
MCV: 88.1 fL (ref 78.0–100.0)
MONO ABS: 0.7 10*3/uL (ref 0.1–1.0)
Monocytes Relative: 9 % (ref 3–12)
Neutro Abs: 4.7 10*3/uL (ref 1.7–7.7)
Neutrophils Relative %: 63 % (ref 43–77)
PLATELETS: 259 10*3/uL (ref 150–400)
RBC: 4.55 MIL/uL (ref 3.87–5.11)
RDW: 13.4 % (ref 11.5–15.5)
WBC: 7.4 10*3/uL (ref 4.0–10.5)

## 2014-06-02 LAB — HEMOGLOBIN A1C
Hgb A1c MFr Bld: 5.6 % (ref <5.7)
Mean Plasma Glucose: 114 mg/dL (ref <117)

## 2014-06-02 MED ORDER — MIRABEGRON ER 50 MG PO TB24: 50.0000 mg | ORAL_TABLET | Freq: Every day | ORAL | Status: DC

## 2014-06-02 NOTE — Progress Notes (Signed)
Complete Physical  Assessment and Plan:  Colon polyp, hyperplastic history- now due every 10 years  Unspecified essential hypertension-- continue medications, DASH diet, exercise and monitor at home. Call if greater than 130/80.   Hyperlipidemia--continue medications, check lipids, decrease fatty foods, increase activity.   Anemia-check CBC  Hypothyroidism-check TSH level, continue medications the same.   Prediabetes-Discussed general issues about diabetes pathophysiology and management., Educational material distributed., Suggested low cholesterol diet., Encouraged aerobic exercise., Discussed foot care., Reminded to get yearly retinal exam.  GERD (gastroesophageal reflux disease)- continue prilosec  Depression- remission  Vitamin D deficiency- check level  Anxiety-remission  Cancer basel cell- continue follow up with Dr. Denna Haggard.  Tetanus  Discussed med's effects and SE's. Screening labs and tests as requested with regular follow-up as recommended.  HPI 64 y.o. female  presents for a complete physical. Her blood pressure has been controlled at home, today their BP is BP: 120/70 mmHg She does workout. She denies chest pain, shortness of breath, dizziness.  She is on cholesterol medication, vytorin and denies myalgias. Her cholesterol is at goal. The cholesterol last visit was:   Lab Results  Component Value Date   CHOL 196 03/03/2014   HDL 62 03/03/2014   LDLCALC 103* 03/03/2014   TRIG 153* 03/03/2014   CHOLHDL 3.2 03/03/2014   Last A1C in the office was:  Lab Results  Component Value Date   HGBA1C 5.4 03/03/2014   [name].   She is on thyroid medication. Her medication was not changed last visit. Patient denies nervousness, palpitations and weight changes.  Lab Results  Component Value Date   TSH 1.281 03/03/2014  She has been tried on Vesicare and was unable to tolerate due to constipation, she is on myrbetriq 50 but cuts in half and states it helps  significantly with OAB/incontinence. She gets it from [employer] and it is 600 dollars they will not redeem card will try with card from [facility] .   Current Medications:  Current Outpatient Prescriptions on File Prior to Visit  Medication Sig Dispense Refill  . aspirin 81 MG chewable tablet Chew by mouth daily.      Marland Kitchen buPROPion (WELLBUTRIN XL) 150 MG 24 hr tablet Take 1 tablet (150 mg total) by mouth daily.  90 tablet  0  . Cholecalciferol (VITAMIN D PO) Take 1,000 Int'l Units by mouth daily.      Marland Kitchen estradiol (ESTRACE) 0.5 MG tablet TAKE 1 TABLET DAILY  90 tablet  1  . ezetimibe-simvastatin (VYTORIN) 10-40 MG per tablet TAKE 1 TABLET DAILY  90 tablet  0  . levothyroxine (SYNTHROID, LEVOTHROID) 100 MCG tablet TAKE 1 TABLET DAILY  90 tablet  0  . losartan (COZAAR) 100 MG tablet Take 1 tablet (100 mg total) by mouth daily.  90 tablet  1  . MAGNESIUM PO Take by mouth daily.      . mirabegron ER (MYRBETRIQ) 50 MG TB24 tablet Take 1 tablet (50 mg total) by mouth daily.  90 tablet  0  . Multiple Vitamins-Minerals (MULTIVITAMIN PO) Take by mouth daily.      Marland Kitchen omeprazole (PRILOSEC) 20 MG capsule TAKE 1 CAPSULE DAILY  90 capsule  1  . OVER THE COUNTER MEDICATION Tumeric daily       No current facility-administered medications on file prior to visit.   Health Maintenance:   Immunization History  Administered Date(s) Administered  . Pneumococcal Polysaccharide-23 03/25/2006  . Td 03/15/2004   Tetanus: 2005 DUE Pneumovax: N/A Flu  vaccine: 2007  Zostavax: will call insurance Pap: 05/2013 normal MGM:11/2013 3D MGM DEXA: N/A Colonoscopy: 04/2014 due 10 years EGD: N/A  Patient Care Team: Unk Pinto, MD as PCP - General (Internal Medicine) Melissa Noon, Andrew as Referring Physician (Optometry) Lavonna Monarch, MD as Consulting Physician (Dermatology) Inda Castle, MD as Consulting Physician (Gastroenterology) Daria Pastures, MD as Consulting Physician (Obstetrics and Gynecology) Lorn Junes, MD as Consulting Physician (Orthopedic Surgery)   Allergies:  Allergies  Allergen Reactions  . Lotensin [Benazepril Hcl]     Not effective for pt  . Naldecon Senior [Guaifenesin]     Unsure of reaction  . Prednisone     Unsure of reaction   Medical History:  Past Medical History  Diagnosis Date  . Colon polyp, hyperplastic     2005  . Unspecified essential hypertension   . Hyperlipidemia   . Anemia   . Thyroid disease   . Prediabetes   . GERD (gastroesophageal reflux disease)   . Depression   . Vitamin D deficiency   . Anxiety   . Cancer     basal cell CA- nose   Surgical History:  Past Surgical History  Procedure Laterality Date  . Tonsillectomy    . Thyroidectomy    . Vaginal hysterectomy    . Breast lumpectomy    . Colonoscopy    . Mohs surgery    . Knee arthroscopy      knee "cleanup" of right knee cartiledge   Family History:  Family History  Problem Relation Age of Onset  . Heart disease Mother   . Hypertension Mother   . Hypertension Father   . Alzheimer's disease Father   . Colon cancer Neg Hx   . Esophageal cancer Neg Hx   . Rectal cancer Neg Hx   . Stomach cancer Neg Hx    Social History:  History  Substance Use Topics  . Smoking status: Former Research scientist (life sciences)  . Smokeless tobacco: Never Used  . Alcohol Use: Yes     Comment: on Fridays- wine   Review of Systems: [X]  = complains of  [ ]  = denies  General: Fatigue [ ]  Fever [ ]  Chills [ ]  Weakness [ ]   Insomnia [ ] Weight change [ ]  Night sweats [ ]   Change in appetite [ ]  Eyes: Redness [ ]  Blurred vision [ ]  Diplopia [ ]  Discharge [ ]   ENT: Congestion [ ]  Sinus Pain [ ]  Post Nasal Drip [ ]  Sore Throat [ ]  Earache [ ]  hearing loss [ ]  Tinnitus [ ]  Snoring [ ]   Cardiac: Chest pain/pressure [ ]  SOB [ ]  Orthopnea [ ]   Palpitations [ ]   Paroxysmal nocturnal dyspnea[ ]  Claudication [ ]  Edema [ ]   Pulmonary: Cough [ ]  Wheezing[ ]   SOB [ ]   Pleurisy [ ]   GI: Nausea [ ]  Vomiting[ ]  Dysphagia[ ]   Heartburn[ ]  Abdominal pain [ ]  Constipation [ ] ; Diarrhea [ ]  BRBPR [ ]  Melena[ ]  Bloating [ ]  Hemorrhoids [ ]   GU: Hematuria[ ]  Dysuria [ ]  Nocturia[ ]  Urgency [ ]   Hesitancy [ ]  Discharge [ ]  Frequency [ ]   Breast:  Breast lumps [ ]   nipple discharge [ ]    Neuro: Headaches[ ]  Vertigo[ ]  Paresthesias[ ]  Spasm [ ]  Speech changes [ ]  Incoordination [ ]   Ortho: Arthritis [ ]  Joint pain [ ]  Muscle pain [ ]  Joint swelling [ ]  Back Pain [ ]  Skin:  Rash [ ]   Pruritis [ ]   Change in skin lesion [ ]   Psych: Depression[ ]  Anxiety[ ]  Confusion [ ]  Memory loss [ ]   Heme/Lypmh: Bleeding [ ]  Bruising [ ]  Enlarged lymph nodes [ ]   Endocrine: Visual blurring [ ]  Paresthesia [ ]  Polyuria [ ]  Polydypsea [ ]    Heat/cold intolerance [ ]  Hypoglycemia [ ]   Physical Exam: Estimated body mass index is 27.01 kg/(m^2) as calculated from the following:   Height as of this encounter: 5' 9"  (1.753 m).   Weight as of this encounter: 183 lb (83.008 kg). BP 120/70  Pulse 88  Temp(Src) 98.1 F (36.7 C)  Resp 16  Ht 5' 9"  (1.753 m)  Wt 183 lb (83.008 kg)  BMI 27.01 kg/m2 General Appearance: Well nourished, in no apparent distress. Eyes: PERRLA, EOMs, conjunctiva no swelling or erythema, normal fundi and vessels. Sinuses: No Frontal/maxillary tenderness ENT/Mouth: Ext aud canals clear, normal light reflex with TMs without erythema, bulging.  Good dentition. No erythema, swelling, or exudate on post pharynx. Tonsils not swollen or erythematous. Hearing normal.  Neck: Supple, thyroid normal. No bruits Respiratory: Respiratory effort normal, BS equal bilaterally without rales, rhonchi, wheezing or stridor. Cardio: RRR without murmurs, rubs or gallops. Brisk peripheral pulses without edema.  Chest: symmetric, with normal excursions and percussion. Breasts: defer Abdomen: Soft, +BS. Non tender, no guarding, rebound, hernias, masses, or organomegaly. .  Lymphatics: Non tender without lymphadenopathy.  Genitourinary:  defer Musculoskeletal: Full ROM all peripheral extremities,5/5 strength, and normal gait. Skin: Warm, dry without rashes, lesions, ecchymosis.  Neuro: Cranial nerves intact, reflexes equal bilaterally. Normal muscle tone, no cerebellar symptoms. Sensation intact.  Psych: Awake and oriented X 3, normal affect, Insight and Judgment appropriate.   EKG: defer AORTA SCAN: defer   Vicie Mutters 1:39 PM

## 2014-06-02 NOTE — Patient Instructions (Signed)

## 2014-06-03 LAB — URINALYSIS, ROUTINE W REFLEX MICROSCOPIC
Bilirubin Urine: NEGATIVE
Glucose, UA: NEGATIVE mg/dL
Leukocytes, UA: NEGATIVE
NITRITE: NEGATIVE
Protein, ur: NEGATIVE mg/dL
SPECIFIC GRAVITY, URINE: 1.026 (ref 1.005–1.030)
UROBILINOGEN UA: 0.2 mg/dL (ref 0.0–1.0)
pH: 6 (ref 5.0–8.0)

## 2014-06-03 LAB — BASIC METABOLIC PANEL WITH GFR
BUN: 13 mg/dL (ref 6–23)
CO2: 24 mEq/L (ref 19–32)
CREATININE: 1.15 mg/dL — AB (ref 0.50–1.10)
Calcium: 9.6 mg/dL (ref 8.4–10.5)
Chloride: 104 mEq/L (ref 96–112)
GFR, EST AFRICAN AMERICAN: 59 mL/min — AB
GFR, EST NON AFRICAN AMERICAN: 51 mL/min — AB
Glucose, Bld: 107 mg/dL — ABNORMAL HIGH (ref 70–99)
Potassium: 4 mEq/L (ref 3.5–5.3)
Sodium: 141 mEq/L (ref 135–145)

## 2014-06-03 LAB — LIPID PANEL
Cholesterol: 190 mg/dL (ref 0–200)
HDL: 78 mg/dL (ref >39)
LDL Cholesterol: 84 mg/dL (ref 0–99)
TRIGLYCERIDES: 141 mg/dL (ref <150)
Total CHOL/HDL Ratio: 2.4 Ratio
VLDL: 28 mg/dL (ref 0–40)

## 2014-06-03 LAB — INSULIN, FASTING: INSULIN FASTING, SERUM: 83 u[IU]/mL — AB (ref 3–28)

## 2014-06-03 LAB — HEPATIC FUNCTION PANEL
ALBUMIN: 4.5 g/dL (ref 3.5–5.2)
ALT: 18 U/L (ref 0–35)
AST: 15 U/L (ref 0–37)
Alkaline Phosphatase: 60 U/L (ref 39–117)
Bilirubin, Direct: 0.1 mg/dL (ref 0.0–0.3)
Indirect Bilirubin: 0.3 mg/dL (ref 0.2–1.2)
TOTAL PROTEIN: 6.9 g/dL (ref 6.0–8.3)
Total Bilirubin: 0.4 mg/dL (ref 0.2–1.2)

## 2014-06-03 LAB — TSH: TSH: 0.584 u[IU]/mL (ref 0.350–4.500)

## 2014-06-03 LAB — IRON AND TIBC
%SAT: 18 % — ABNORMAL LOW (ref 20–55)
Iron: 70 ug/dL (ref 42–145)
TIBC: 400 ug/dL (ref 250–470)
UIBC: 330 ug/dL (ref 125–400)

## 2014-06-03 LAB — VITAMIN B12: Vitamin B-12: 742 pg/mL (ref 211–911)

## 2014-06-03 LAB — MICROALBUMIN / CREATININE URINE RATIO
CREATININE, URINE: 389.5 mg/dL
MICROALB UR: 4.97 mg/dL — AB (ref 0.00–1.89)
Microalb Creat Ratio: 12.8 mg/g (ref 0.0–30.0)

## 2014-06-03 LAB — URINALYSIS, MICROSCOPIC ONLY
Bacteria, UA: NONE SEEN
CASTS: NONE SEEN
CRYSTALS: NONE SEEN
SQUAMOUS EPITHELIAL / LPF: NONE SEEN

## 2014-06-03 LAB — MAGNESIUM: Magnesium: 1.8 mg/dL (ref 1.5–2.5)

## 2014-06-03 LAB — VITAMIN D 25 HYDROXY (VIT D DEFICIENCY, FRACTURES): Vit D, 25-Hydroxy: 58 ng/mL (ref 30–89)

## 2014-06-09 ENCOUNTER — Other Ambulatory Visit: Payer: Self-pay | Admitting: Physician Assistant

## 2014-06-09 DIAGNOSIS — E785 Hyperlipidemia, unspecified: Secondary | ICD-10-CM

## 2014-06-09 MED ORDER — EZETIMIBE-SIMVASTATIN 10-40 MG PO TABS: ORAL_TABLET | ORAL | Status: DC

## 2014-07-22 ENCOUNTER — Other Ambulatory Visit: Payer: Self-pay | Admitting: Physician Assistant

## 2014-07-22 MED ORDER — BUPROPION HCL ER (XL) 150 MG PO TB24: 150.0000 mg | ORAL_TABLET | Freq: Every day | ORAL | Status: DC

## 2014-08-09 ENCOUNTER — Other Ambulatory Visit: Payer: Self-pay | Admitting: Dermatology

## 2014-08-25 ENCOUNTER — Other Ambulatory Visit: Payer: Self-pay | Admitting: Obstetrics and Gynecology

## 2014-08-29 LAB — CYTOLOGY - PAP

## 2014-09-04 ENCOUNTER — Encounter: Payer: Self-pay | Admitting: Internal Medicine

## 2014-09-04 MED ORDER — OMEPRAZOLE 20 MG PO CPDR: DELAYED_RELEASE_CAPSULE | ORAL | Status: DC

## 2014-10-24 ENCOUNTER — Other Ambulatory Visit: Payer: Self-pay | Admitting: *Deleted

## 2014-10-24 ENCOUNTER — Other Ambulatory Visit: Payer: Self-pay | Admitting: Emergency Medicine

## 2014-10-24 ENCOUNTER — Encounter: Payer: Self-pay | Admitting: Internal Medicine

## 2014-10-24 ENCOUNTER — Other Ambulatory Visit: Payer: Self-pay | Admitting: Internal Medicine

## 2014-10-24 DIAGNOSIS — R32 Unspecified urinary incontinence: Secondary | ICD-10-CM

## 2014-10-24 MED ORDER — MIRABEGRON ER 50 MG PO TB24: 50.0000 mg | ORAL_TABLET | Freq: Every day | ORAL | Status: DC

## 2014-10-24 MED ORDER — LEVOTHYROXINE SODIUM 100 MCG PO TABS: ORAL_TABLET | ORAL | Status: DC

## 2014-11-10 ENCOUNTER — Encounter: Payer: Self-pay | Admitting: Internal Medicine

## 2014-11-10 ENCOUNTER — Ambulatory Visit (INDEPENDENT_AMBULATORY_CARE_PROVIDER_SITE_OTHER): Payer: BC Managed Care – PPO | Admitting: Internal Medicine

## 2014-11-10 VITALS — BP 138/90 | HR 72 | Temp 98.1°F | Resp 16 | Ht 69.0 in | Wt 187.6 lb

## 2014-11-10 DIAGNOSIS — E559 Vitamin D deficiency, unspecified: Secondary | ICD-10-CM

## 2014-11-10 DIAGNOSIS — R7309 Other abnormal glucose: Secondary | ICD-10-CM

## 2014-11-10 DIAGNOSIS — E785 Hyperlipidemia, unspecified: Secondary | ICD-10-CM

## 2014-11-10 DIAGNOSIS — Z79899 Other long term (current) drug therapy: Secondary | ICD-10-CM | POA: Insufficient documentation

## 2014-11-10 DIAGNOSIS — R7303 Prediabetes: Secondary | ICD-10-CM

## 2014-11-10 DIAGNOSIS — I10 Essential (primary) hypertension: Secondary | ICD-10-CM

## 2014-11-10 LAB — CBC WITH DIFFERENTIAL/PLATELET
Basophils Absolute: 0.1 10*3/uL (ref 0.0–0.1)
Basophils Relative: 1 % (ref 0–1)
EOS ABS: 0.1 10*3/uL (ref 0.0–0.7)
Eosinophils Relative: 1 % (ref 0–5)
HCT: 40.2 % (ref 36.0–46.0)
HEMOGLOBIN: 13.4 g/dL (ref 12.0–15.0)
LYMPHS ABS: 1.8 10*3/uL (ref 0.7–4.0)
Lymphocytes Relative: 27 % (ref 12–46)
MCH: 29.9 pg (ref 26.0–34.0)
MCHC: 33.3 g/dL (ref 30.0–36.0)
MCV: 89.7 fL (ref 78.0–100.0)
Monocytes Absolute: 0.6 10*3/uL (ref 0.1–1.0)
Monocytes Relative: 9 % (ref 3–12)
NEUTROS ABS: 4 10*3/uL (ref 1.7–7.7)
NEUTROS PCT: 62 % (ref 43–77)
PLATELETS: 239 10*3/uL (ref 150–400)
RBC: 4.48 MIL/uL (ref 3.87–5.11)
RDW: 13.5 % (ref 11.5–15.5)
WBC: 6.5 10*3/uL (ref 4.0–10.5)

## 2014-11-10 NOTE — Patient Instructions (Signed)

## 2014-11-10 NOTE — Progress Notes (Signed)
Patient ID: Cheyenne Conway, female   DOB: 1951/02/17, 63 y.o.   MRN: [id number]   This very nice 63 y.o.MWF is a long time patient of the practice 25 years since 1993 presents for 3 month follow up with Hypertension, Hyperlipidemia, Pre-Diabetes and Vitamin D Deficiency.    Patient is treated for HTN & BP has been controlled and today's BP: 138/90 mmHg. Patient has had no complaints of any cardiac type chest pain, palpitations, dyspnea/orthopnea/PND, dizziness, claudication, or dependent edema.   Hyperlipidemia is controlled with diet & meds. Patient denies myalgias or other med SE's. Last Lipids were at goal -  Total Chol 190; HDL 78; LDL 84; [id number] on  06/02/2014.   Also, the patient has history of PreDiabetes with A1c 6.1% in Dec 2014 and has had no symptoms of reactive hypoglycemia, diabetic polys, paresthesias or visual blurring.  Last A1c was  5.6% on  06/02/2014.   Further, the patient also has history of Vitamin D Deficiency and supplements vitamin D without any suspected side-effects. Last vitamin D was  58 on  06/02/2014.   Medication List   buPROPion 150 MG 24 hr tablet  Commonly known as:  WELLBUTRIN XL  Take 1 tablet (150 mg total) by mouth daily.     estradiol 0.5 MG tablet  Commonly known as:  ESTRACE  TAKE 1 TABLET DAILY     ezetimibe-simvastatin 10-40 MG per tablet  Commonly known as:  VYTORIN  TAKE 1 TABLET DAILY     levothyroxine 100 MCG tablet  Commonly known as:  SYNTHROID, LEVOTHROID  TAKE 1 TABLET DAILY     losartan 100 MG tablet  Commonly known as:  COZAAR  Take 1 tablet (100 mg total) by mouth daily.     Magnesium 500 MG Tabs  Take 1 tablet by mouth daily.     MAGNESIUM PO  Take by mouth daily.     mirabegron ER 50 MG Tb24 tablet  Commonly known as:  MYRBETRIQ  Take 1 tablet (50 mg total) by mouth daily.     MULTIVITAMIN PO  Take by mouth daily.     omeprazole 20 MG capsule  Commonly known as:  PRILOSEC  TAKE 1 CAPSULE DAILY     OVER THE COUNTER  MEDICATION  Tumeric daily     VITAMIN D PO  Take 1,000 Int'l Units by mouth daily.     Allergies  Allergen Reactions  . Lotensin [Benazepril Hcl]     Not effective for pt  . Naldecon Senior [Guaifenesin]     Unsure of reaction  . Prednisone     Unsure of reaction   PMHx:   Past Medical History  Diagnosis Date  . Colon polyp, hyperplastic     2005  . Unspecified essential hypertension   . Hyperlipidemia   . Anemia   . Thyroid disease   . Prediabetes   . GERD (gastroesophageal reflux disease)   . Depression   . Vitamin D deficiency   . Anxiety   . Cancer     basal cell CA- nose   Immunization History  Administered Date(s) Administered  . Pneumococcal Polysaccharide-23 03/25/2006  . Td 03/15/2004  . Tdap 06/02/2014   Past Surgical History  Procedure Laterality Date  . Tonsillectomy    . Thyroidectomy    . Vaginal hysterectomy    . Breast lumpectomy    . Colonoscopy    . Mohs surgery    . Knee arthroscopy      knee "cleanup"  of right knee cartiledge   FHx:    Reviewed / unchanged  SHx:    Reviewed / unchanged  Systems Review:  Constitutional: Denies fever, chills, wt changes, headaches, insomnia, fatigue, night sweats, change in appetite. Eyes: Denies redness, blurred vision, diplopia, discharge, itchy, watery eyes.  ENT: Denies discharge, congestion, post nasal drip, epistaxis, sore throat, earache, hearing loss, dental pain, tinnitus, vertigo, sinus pain, snoring.  CV: Denies chest pain, palpitations, irregular heartbeat, syncope, dyspnea, diaphoresis, orthopnea, PND, claudication or edema. Respiratory: denies cough, dyspnea, DOE, pleurisy, hoarseness, laryngitis, wheezing.  Gastrointestinal: Denies dysphagia, odynophagia, heartburn, reflux, water brash, abdominal pain or cramps, nausea, vomiting, bloating, diarrhea, constipation, hematemesis, melena, hematochezia  or hemorrhoids. Genitourinary: Denies dysuria, frequency, urgency, nocturia, hesitancy,  discharge, hematuria or flank pain. Musculoskeletal: Denies arthralgias, myalgias, stiffness, jt. swelling, pain, limping or strain/sprain.  Skin: Denies pruritus, rash, hives, warts, acne, eczema or change in skin lesion(s). Neuro: No weakness, tremor, incoordination, spasms, paresthesia or pain. Psychiatric: Denies confusion, memory loss or sensory loss. Endo: Denies change in weight, skin or hair change.  Heme/Lymph: No excessive bleeding, bruising or enlarged lymph nodes.  Exam:  BP 138/90  Pulse 72  Temp 98.1 F   Resp 16  Ht 5' 9"   Wt 187 lb 9.6 oz   BMI 27.69   Appears well nourished and in no distress. Eyes: PERRLA, EOMs, conjunctiva no swelling or erythema. Sinuses: No frontal/maxillary tenderness ENT/Mouth: EAC's clear, TM's nl w/o erythema, bulging. Nares clear w/o erythema, swelling, exudates. Oropharynx clear without erythema or exudates. Oral hygiene is good. Tongue normal, non obstructing. Hearing intact.  Neck: Supple. Thyroid nl. Car 2+/2+ without bruits, nodes or JVD. Chest: Respirations nl with BS clear & equal w/o rales, rhonchi, wheezing or stridor.  Cor: Heart sounds normal w/ regular rate and rhythm without sig. murmurs, gallops, clicks, or rubs. Peripheral pulses normal and equal  without edema.  Abdomen: Soft & bowel sounds normal. Non-tender w/o guarding, rebound, hernias, masses, or organomegaly.  Lymphatics: Unremarkable.  Musculoskeletal: Full ROM all peripheral extremities, joint stability, 5/5 strength, and normal gait.  Skin: Warm, dry without exposed rashes, lesions or ecchymosis apparent.  Neuro: Cranial nerves intact, reflexes equal bilaterally. Sensory-motor testing grossly intact. Tendon reflexes grossly intact.  Pysch: Alert & oriented x 3.  Insight and judgement nl & appropriate. No ideations.  Assessment and Plan:  1. Hypertension - Continue monitor blood pressure at home. Continue diet/meds same.  2. Hyperlipidemia - Continue diet/meds,  exercise,& lifestyle modifications. Continue monitor periodic cholesterol/liver & renal functions   3. Pre-Diabetes - Continue diet, exercise, lifestyle modifications. Monitor appropriate labs.  4. Vitamin D Deficiency - Continue supplementation.   Recommended regular exercise, BP monitoring, weight control, and discussed med and SE's. Recommended labs to assess and monitor clinical status. Further disposition pending results of labs.

## 2014-11-11 ENCOUNTER — Encounter: Payer: Self-pay | Admitting: Internal Medicine

## 2014-11-11 LAB — HEPATIC FUNCTION PANEL
ALT: 16 U/L (ref 0–35)
AST: 17 U/L (ref 0–37)
Albumin: 4.6 g/dL (ref 3.5–5.2)
Alkaline Phosphatase: 54 U/L (ref 39–117)
BILIRUBIN DIRECT: 0.1 mg/dL (ref 0.0–0.3)
BILIRUBIN INDIRECT: 0.3 mg/dL (ref 0.2–1.2)
BILIRUBIN TOTAL: 0.4 mg/dL (ref 0.2–1.2)
Total Protein: 6.9 g/dL (ref 6.0–8.3)

## 2014-11-11 LAB — LIPID PANEL
CHOL/HDL RATIO: 2.6 ratio
Cholesterol: 202 mg/dL — ABNORMAL HIGH (ref 0–200)
HDL: 79 mg/dL (ref >39)
LDL Cholesterol: 99 mg/dL (ref 0–99)
Triglycerides: 118 mg/dL (ref <150)
VLDL: 24 mg/dL (ref 0–40)

## 2014-11-11 LAB — BASIC METABOLIC PANEL WITH GFR
BUN: 13 mg/dL (ref 6–23)
CHLORIDE: 105 meq/L (ref 96–112)
CO2: 29 meq/L (ref 19–32)
Calcium: 9.5 mg/dL (ref 8.4–10.5)
Creat: 0.72 mg/dL (ref 0.50–1.10)
GFR, Est African American: >89 mL/min
GFR, Est Non African American: >89 mL/min
Glucose, Bld: 91 mg/dL (ref 70–99)
POTASSIUM: 4.2 meq/L (ref 3.5–5.3)
SODIUM: 141 meq/L (ref 135–145)

## 2014-11-11 LAB — INSULIN, FASTING: Insulin fasting, serum: 5.4 u[IU]/mL (ref 2.0–19.6)

## 2014-11-11 LAB — TSH: TSH: 1.129 u[IU]/mL (ref 0.350–4.500)

## 2014-11-11 LAB — HEMOGLOBIN A1C
Hgb A1c MFr Bld: 5.9 % — ABNORMAL HIGH (ref <5.7)
Mean Plasma Glucose: 123 mg/dL — ABNORMAL HIGH (ref <117)

## 2014-11-11 LAB — MAGNESIUM: MAGNESIUM: 2 mg/dL (ref 1.5–2.5)

## 2014-11-11 LAB — VITAMIN D 25 HYDROXY (VIT D DEFICIENCY, FRACTURES): VIT D 25 HYDROXY: 59 ng/mL (ref 30–89)

## 2014-11-12 ENCOUNTER — Other Ambulatory Visit: Payer: Self-pay | Admitting: Internal Medicine

## 2014-11-12 DIAGNOSIS — R32 Unspecified urinary incontinence: Secondary | ICD-10-CM

## 2014-11-12 MED ORDER — MIRABEGRON ER 50 MG PO TB24: 50.0000 mg | ORAL_TABLET | Freq: Every day | ORAL | Status: DC

## 2014-11-16 ENCOUNTER — Encounter: Payer: Self-pay | Admitting: Internal Medicine

## 2014-11-17 ENCOUNTER — Other Ambulatory Visit: Payer: Self-pay

## 2014-11-17 DIAGNOSIS — I10 Essential (primary) hypertension: Secondary | ICD-10-CM

## 2014-11-17 MED ORDER — LOSARTAN POTASSIUM 100 MG PO TABS: 100.0000 mg | ORAL_TABLET | Freq: Every day | ORAL | Status: DC

## 2014-11-17 MED ORDER — BUPROPION HCL ER (XL) 150 MG PO TB24: 150.0000 mg | ORAL_TABLET | Freq: Every day | ORAL | Status: DC

## 2014-12-11 ENCOUNTER — Encounter: Payer: Self-pay | Admitting: Internal Medicine

## 2014-12-11 ENCOUNTER — Other Ambulatory Visit: Payer: Self-pay | Admitting: Internal Medicine

## 2014-12-11 DIAGNOSIS — E785 Hyperlipidemia, unspecified: Secondary | ICD-10-CM

## 2014-12-11 MED ORDER — EZETIMIBE-SIMVASTATIN 10-40 MG PO TABS: ORAL_TABLET | ORAL | Status: DC

## 2015-01-10 ENCOUNTER — Other Ambulatory Visit: Payer: Self-pay | Admitting: Physician Assistant

## 2015-01-10 ENCOUNTER — Other Ambulatory Visit: Payer: Self-pay | Admitting: Internal Medicine

## 2015-01-10 ENCOUNTER — Other Ambulatory Visit: Payer: Self-pay | Admitting: *Deleted

## 2015-01-10 DIAGNOSIS — F329 Major depressive disorder, single episode, unspecified: Secondary | ICD-10-CM

## 2015-01-10 DIAGNOSIS — F32A Depression, unspecified: Secondary | ICD-10-CM

## 2015-01-10 MED ORDER — OMEPRAZOLE 20 MG PO CPDR: DELAYED_RELEASE_CAPSULE | ORAL | Status: DC

## 2015-01-12 ENCOUNTER — Other Ambulatory Visit: Payer: Self-pay | Admitting: *Deleted

## 2015-01-12 MED ORDER — BUPROPION HCL ER (XL) 150 MG PO TB24: 150.0000 mg | ORAL_TABLET | Freq: Every day | ORAL | Status: DC

## 2015-02-07 ENCOUNTER — Other Ambulatory Visit: Payer: Self-pay | Admitting: *Deleted

## 2015-02-07 MED ORDER — LEVOTHYROXINE SODIUM 100 MCG PO TABS: ORAL_TABLET | ORAL | Status: DC

## 2015-02-23 ENCOUNTER — Ambulatory Visit (INDEPENDENT_AMBULATORY_CARE_PROVIDER_SITE_OTHER): Payer: BLUE CROSS/BLUE SHIELD | Admitting: Physician Assistant

## 2015-02-23 ENCOUNTER — Encounter: Payer: Self-pay | Admitting: Physician Assistant

## 2015-02-23 VITALS — BP 138/78 | HR 92 | Temp 98.2°F | Resp 18 | Ht 69.0 in | Wt 193.0 lb

## 2015-02-23 DIAGNOSIS — E079 Disorder of thyroid, unspecified: Secondary | ICD-10-CM

## 2015-02-23 DIAGNOSIS — Z79899 Other long term (current) drug therapy: Secondary | ICD-10-CM

## 2015-02-23 DIAGNOSIS — R32 Unspecified urinary incontinence: Secondary | ICD-10-CM

## 2015-02-23 DIAGNOSIS — I10 Essential (primary) hypertension: Secondary | ICD-10-CM

## 2015-02-23 DIAGNOSIS — E559 Vitamin D deficiency, unspecified: Secondary | ICD-10-CM

## 2015-02-23 DIAGNOSIS — E785 Hyperlipidemia, unspecified: Secondary | ICD-10-CM

## 2015-02-23 DIAGNOSIS — R7309 Other abnormal glucose: Secondary | ICD-10-CM

## 2015-02-23 DIAGNOSIS — N393 Stress incontinence (female) (male): Secondary | ICD-10-CM

## 2015-02-23 DIAGNOSIS — R7303 Prediabetes: Secondary | ICD-10-CM

## 2015-02-23 LAB — CBC WITH DIFFERENTIAL/PLATELET
BASOS ABS: 0 10*3/uL (ref 0.0–0.1)
BASOS PCT: 0 % (ref 0–1)
Eosinophils Absolute: 0.1 10*3/uL (ref 0.0–0.7)
Eosinophils Relative: 1 % (ref 0–5)
HCT: 40.1 % (ref 36.0–46.0)
Hemoglobin: 13.3 g/dL (ref 12.0–15.0)
Lymphocytes Relative: 22 % (ref 12–46)
Lymphs Abs: 1.7 10*3/uL (ref 0.7–4.0)
MCH: 29.4 pg (ref 26.0–34.0)
MCHC: 33.2 g/dL (ref 30.0–36.0)
MCV: 88.5 fL (ref 78.0–100.0)
MONO ABS: 0.7 10*3/uL (ref 0.1–1.0)
MPV: 8.8 fL (ref 8.6–12.4)
Monocytes Relative: 9 % (ref 3–12)
Neutro Abs: 5.1 10*3/uL (ref 1.7–7.7)
Neutrophils Relative %: 68 % (ref 43–77)
PLATELETS: 270 10*3/uL (ref 150–400)
RBC: 4.53 MIL/uL (ref 3.87–5.11)
RDW: 13.2 % (ref 11.5–15.5)
WBC: 7.5 10*3/uL (ref 4.0–10.5)

## 2015-02-23 MED ORDER — PHENTERMINE HCL 37.5 MG PO TABS: 37.5000 mg | ORAL_TABLET | Freq: Every day | ORAL | Status: DC

## 2015-02-23 MED ORDER — MIRABEGRON ER 50 MG PO TB24: 50.0000 mg | ORAL_TABLET | Freq: Every day | ORAL | Status: DC

## 2015-02-23 NOTE — Progress Notes (Signed)
Assessment and Plan:  Hypertension: Continue medication, monitor blood pressure at home. Continue DASH diet.  Reminder to go to the ER if any CP, SOB, nausea, dizziness, severe HA, changes vision/speech, left arm numbness and tingling, and jaw pain. Cholesterol: Continue diet and exercise. Check cholesterol.  Pre-diabetes-Continue diet and exercise. Check A1C Vitamin D Def- check level and continue medications.  Hypothyroidism-check TSH level, continue medications the same, reminded to take on an empty stomach 30-4mns before food.  Stress incontinence- discussed PT, declines for now will continue myrebtriq.   Continue diet and meds as discussed. Further disposition pending results of labs.  [name]  presents for 3 month follow up with hypertension, hyperlipidemia, prediabetes and vitamin D.  Her blood pressure has been controlled at home, today their BP is BP: 138/78 mmHg  She does workout, kick boxing.  She denies chest pain, shortness of breath, dizziness.  She is on cholesterol medication and denies myalgias. Her cholesterol is at goal. The cholesterol last visit was:   Lab Results  Component Value Date   CHOL 202* 11/10/2014   HDL 79 11/10/2014   LDLCALC 99 11/10/2014   TRIG 118 11/10/2014   CHOLHDL 2.6 11/10/2014   She has been working on diet and exercise for prediabetes, and denies paresthesia of the feet, polydipsia, polyuria and visual disturbances. Last A1C in the office was:  Lab Results  Component Value Date   HGBA1C 5.9* 11/10/2014  [name].   Lab Results  Component Value Date   VD25OH 55611/11/2014    She is on thyroid medication. Her medication was not changed last visit.   Lab Results  Component Value Date   TSH 1.129 11/10/2014  .    Current Medications:  Current Outpatient Prescriptions on File Prior to Visit  Medication Sig Dispense Refill  . aspirin 81 MG chewable tablet Chew by mouth daily.    .Marland KitchenbuPROPion  (WELLBUTRIN XL) 150 MG 24 hr tablet Take 1 tablet (150 mg total) by mouth daily. 90 tablet 1  . Cholecalciferol (VITAMIN D PO) Take 1,000 Int'l Units by mouth daily.    .Marland Kitchenestradiol (ESTRACE) 0.5 MG tablet TAKE 1 TABLET DAILY 90 tablet 0  . ezetimibe-simvastatin (VYTORIN) 10-40 MG per tablet TAKE 1 TABLET DAILY 90 tablet 1  . levothyroxine (SYNTHROID, LEVOTHROID) 100 MCG tablet TAKE 1 TABLET DAILY 90 tablet 1  . losartan (COZAAR) 100 MG tablet Take 1 tablet (100 mg total) by mouth daily. 90 tablet 3  . Magnesium 500 MG TABS Take 1 tablet by mouth daily.    .Marland KitchenMAGNESIUM PO Take by mouth daily.    . mirabegron ER (MYRBETRIQ) 50 MG TB24 tablet Take 1 tablet (50 mg total) by mouth daily. 30 tablet 99  . Multiple Vitamins-Minerals (MULTIVITAMIN PO) Take by mouth daily.    .Marland Kitchenomeprazole (PRILOSEC) 20 MG capsule TAKE 1 CAPSULE DAILY 90 capsule 1  . OVER THE COUNTER MEDICATION Tumeric daily     No current facility-administered medications on file prior to visit.   Medical History:  Past Medical History  Diagnosis Date  . Colon polyp, hyperplastic     2005  . Unspecified essential hypertension   . Hyperlipidemia   . Anemia   . Thyroid disease   . Prediabetes   . GERD (gastroesophageal reflux disease)   . Depression   . Vitamin D deficiency   . Anxiety   . Cancer     basal cell CA- nose  Allergies:  Allergies  Allergen Reactions  . Lotensin [Benazepril Hcl]     Not effective for pt  . Naldecon Senior [Guaifenesin]     Unsure of reaction  . Prednisone     Unsure of reaction    Review of Systems:  Review of Systems  Constitutional: Negative.   HENT: Negative.   Respiratory: Negative.   Cardiovascular: Negative.   Gastrointestinal: Negative.   Genitourinary: Negative.        + incontinence  Musculoskeletal: Negative.   Neurological: Negative.   Psychiatric/Behavioral: Negative.     Family history- Review and unchanged Social history- Review and unchanged Physical Exam: BP  138/78 mmHg  Pulse 92  Temp(Src) 98.2 F (36.8 C) (Temporal)  Resp 18  Ht 5' 9"  (1.753 m)  Wt 193 lb (87.544 kg)  BMI 28.49 kg/m2 Wt Readings from Last 3 Encounters:  02/23/15 193 lb (87.544 kg)  11/10/14 187 lb 9.6 oz (85.095 kg)  06/02/14 183 lb (83.008 kg)   General Appearance: Well nourished, in no apparent distress. Eyes: PERRLA, EOMs, conjunctiva no swelling or erythema Sinuses: No Frontal/maxillary tenderness ENT/Mouth: Ext aud canals clear, TMs without erythema, bulging. No erythema, swelling, or exudate on post pharynx.  Tonsils not swollen or erythematous. Hearing normal.  Neck: Supple, thyroid normal.  Respiratory: Respiratory effort normal, BS equal bilaterally without rales, rhonchi, wheezing or stridor.  Cardio: RRR with no MRGs. Brisk peripheral pulses without edema.  Abdomen: Soft, + BS,  Non tender, no guarding, rebound, hernias, masses. Lymphatics: Non tender without lymphadenopathy.  Musculoskeletal: Full ROM, 5/5 strength, Normal gait Skin: Warm, dry without rashes, lesions, ecchymosis.  Neuro: Cranial nerves intact. Normal muscle tone, no cerebellar symptoms. Psych: Awake and oriented X 3, normal affect, Insight and Judgment appropriate.    Vicie Mutters, PA-C 2:04 PM [facility]

## 2015-02-23 NOTE — Patient Instructions (Signed)
Phentermine  While taking the medication we may ask that you come into the office once a month or once every 2-3 months to monitor your weight, blood pressure, and heart rate. In addition we can help answer your questions about diet, exercise, and help you every step of the way with your weight loss journey. Sometime it is helpful if you bring in a food diary or use an app on your phone such as myfitnesspal to record your calorie intake, especially in the beginning.   You can start out on 1/3 to 1/2 a pill in the morning and if you are tolerating it well you can increase to one pill daily. I also have some patients that take 1/3 or 1/2 at lunch to help prevent night time eating.  This medication is cheapest CASH pay at [facility] and you do NOT need a membership to get meds from there.    What is this medicine? PHENTERMINE (FEN ter meen) decreases your appetite. This medicine is intended to be used in addition to a healthy reduced calorie diet and exercise. The best results are achieved this way. This medicine is only indicated for short-term use. Eventually your weight loss may level out and the medication will no longer be needed.   How should I use this medicine? Take this medicine by mouth. Follow the directions on the prescription label. The tablets should stay in the bottle until immediately before you take your dose. Take your doses at regular intervals. Do not take your medicine more often than directed.  Overdosage: If you think you have taken too much of this medicine contact a poison control center or emergency room at once. NOTE: This medicine is only for you. Do not share this medicine with others.  What if I miss a dose? If you miss a dose, take it as soon as you can. If it is almost time for your next dose, take only that dose. Do not take double or extra doses. Do not increase or in any way change your dose without consulting your doctor.  What should I watch for while using  this medicine? Notify your physician immediately if you become short of breath while doing your normal activities. Do not take this medicine within 6 hours of bedtime. It can keep you from getting to sleep. Avoid drinks that contain caffeine and try to stick to a regular bedtime every night. Do not stand or sit up quickly, especially if you are an older patient. This reduces the risk of dizzy or fainting spells. Avoid alcoholic drinks.  What side effects may I notice from receiving this medicine? Side effects that you should report to your doctor or health care professional as soon as possible: -chest pain, palpitations -depression or severe changes in mood -increased blood pressure -irritability -nervousness or restlessness -severe dizziness -shortness of breath -problems urinating -unusual swelling of the legs -vomiting  Side effects that usually do not require medical attention (report to your doctor or health care professional if they continue or are bothersome): -blurred vision or other eye problems -changes in sexual ability or desire -constipation or diarrhea -difficulty sleeping -dry mouth or unpleasant taste -headache -nausea This list may not describe all possible side effects. Call your doctor for medical advice about side effects. You may report side effects to FDA at 1-800-FDA-1088.

## 2015-02-24 LAB — HEMOGLOBIN A1C
HEMOGLOBIN A1C: 6.1 % — AB (ref <5.7)
Mean Plasma Glucose: 128 mg/dL — ABNORMAL HIGH (ref <117)

## 2015-02-24 LAB — BASIC METABOLIC PANEL WITH GFR
BUN: 14 mg/dL (ref 6–23)
CHLORIDE: 104 meq/L (ref 96–112)
CO2: 25 mEq/L (ref 19–32)
Calcium: 9.4 mg/dL (ref 8.4–10.5)
Creat: 0.99 mg/dL (ref 0.50–1.10)
GFR, Est African American: 71 mL/min
GFR, Est Non African American: 62 mL/min
GLUCOSE: 92 mg/dL (ref 70–99)
POTASSIUM: 3.9 meq/L (ref 3.5–5.3)
SODIUM: 139 meq/L (ref 135–145)

## 2015-02-24 LAB — HEPATIC FUNCTION PANEL
ALBUMIN: 4.4 g/dL (ref 3.5–5.2)
ALT: 18 U/L (ref 0–35)
AST: 17 U/L (ref 0–37)
Alkaline Phosphatase: 57 U/L (ref 39–117)
BILIRUBIN TOTAL: 0.3 mg/dL (ref 0.2–1.2)
Bilirubin, Direct: 0.1 mg/dL (ref 0.0–0.3)
Indirect Bilirubin: 0.2 mg/dL (ref 0.2–1.2)
Total Protein: 6.8 g/dL (ref 6.0–8.3)

## 2015-02-24 LAB — LIPID PANEL
Cholesterol: 185 mg/dL (ref 0–200)
HDL: 75 mg/dL (ref >=46)
LDL Cholesterol: 82 mg/dL (ref 0–99)
Total CHOL/HDL Ratio: 2.5 Ratio
Triglycerides: 142 mg/dL (ref <150)
VLDL: 28 mg/dL (ref 0–40)

## 2015-02-24 LAB — MAGNESIUM: MAGNESIUM: 2 mg/dL (ref 1.5–2.5)

## 2015-02-24 LAB — TSH: TSH: 1.214 u[IU]/mL (ref 0.350–4.500)

## 2015-02-24 LAB — VITAMIN D 25 HYDROXY (VIT D DEFICIENCY, FRACTURES): Vit D, 25-Hydroxy: 37 ng/mL (ref 30–100)

## 2015-03-06 ENCOUNTER — Other Ambulatory Visit: Payer: Self-pay | Admitting: Internal Medicine

## 2015-03-06 ENCOUNTER — Other Ambulatory Visit: Payer: Self-pay | Admitting: *Deleted

## 2015-03-06 MED ORDER — ESTRADIOL 0.5 MG PO TABS: 0.5000 mg | ORAL_TABLET | Freq: Every day | ORAL | Status: DC

## 2015-04-18 ENCOUNTER — Other Ambulatory Visit: Payer: Self-pay | Admitting: Internal Medicine

## 2015-05-13 ENCOUNTER — Encounter: Payer: Self-pay | Admitting: *Deleted

## 2015-06-05 ENCOUNTER — Encounter: Payer: Self-pay | Admitting: Internal Medicine

## 2015-06-05 ENCOUNTER — Other Ambulatory Visit: Payer: Self-pay | Admitting: *Deleted

## 2015-06-05 MED ORDER — OMEPRAZOLE 20 MG PO CPDR: DELAYED_RELEASE_CAPSULE | ORAL | Status: DC

## 2015-06-05 MED ORDER — BUPROPION HCL ER (XL) 150 MG PO TB24: 150.0000 mg | ORAL_TABLET | Freq: Every day | ORAL | Status: DC

## 2015-06-12 ENCOUNTER — Encounter: Payer: Self-pay | Admitting: Physician Assistant

## 2015-06-12 ENCOUNTER — Ambulatory Visit (INDEPENDENT_AMBULATORY_CARE_PROVIDER_SITE_OTHER): Payer: BLUE CROSS/BLUE SHIELD | Admitting: Physician Assistant

## 2015-06-12 VITALS — BP 154/82 | HR 86 | Temp 98.0°F | Resp 18 | Ht 69.0 in | Wt 187.0 lb

## 2015-06-12 DIAGNOSIS — I10 Essential (primary) hypertension: Secondary | ICD-10-CM

## 2015-06-12 DIAGNOSIS — F329 Major depressive disorder, single episode, unspecified: Secondary | ICD-10-CM

## 2015-06-12 DIAGNOSIS — Z1159 Encounter for screening for other viral diseases: Secondary | ICD-10-CM

## 2015-06-12 DIAGNOSIS — Z0000 Encounter for general adult medical examination without abnormal findings: Secondary | ICD-10-CM

## 2015-06-12 DIAGNOSIS — D649 Anemia, unspecified: Secondary | ICD-10-CM

## 2015-06-12 DIAGNOSIS — Z79899 Other long term (current) drug therapy: Secondary | ICD-10-CM

## 2015-06-12 DIAGNOSIS — Z85828 Personal history of other malignant neoplasm of skin: Secondary | ICD-10-CM | POA: Insufficient documentation

## 2015-06-12 DIAGNOSIS — N393 Stress incontinence (female) (male): Secondary | ICD-10-CM | POA: Insufficient documentation

## 2015-06-12 DIAGNOSIS — R319 Hematuria, unspecified: Secondary | ICD-10-CM

## 2015-06-12 DIAGNOSIS — K7581 Nonalcoholic steatohepatitis (NASH): Secondary | ICD-10-CM

## 2015-06-12 DIAGNOSIS — K219 Gastro-esophageal reflux disease without esophagitis: Secondary | ICD-10-CM

## 2015-06-12 DIAGNOSIS — E559 Vitamin D deficiency, unspecified: Secondary | ICD-10-CM

## 2015-06-12 DIAGNOSIS — R7303 Prediabetes: Secondary | ICD-10-CM

## 2015-06-12 DIAGNOSIS — E785 Hyperlipidemia, unspecified: Secondary | ICD-10-CM

## 2015-06-12 DIAGNOSIS — E079 Disorder of thyroid, unspecified: Secondary | ICD-10-CM

## 2015-06-12 DIAGNOSIS — F32A Depression, unspecified: Secondary | ICD-10-CM

## 2015-06-12 MED ORDER — EZETIMIBE-SIMVASTATIN 10-40 MG PO TABS: ORAL_TABLET | ORAL | Status: DC

## 2015-06-12 MED ORDER — BUPROPION HCL ER (XL) 150 MG PO TB24: 150.0000 mg | ORAL_TABLET | Freq: Every day | ORAL | Status: DC

## 2015-06-12 MED ORDER — LEVOTHYROXINE SODIUM 100 MCG PO TABS: ORAL_TABLET | ORAL | Status: DC

## 2015-06-12 MED ORDER — LOSARTAN POTASSIUM 100 MG PO TABS: 100.0000 mg | ORAL_TABLET | Freq: Every day | ORAL | Status: DC

## 2015-06-12 MED ORDER — RANITIDINE HCL 300 MG PO TABS: ORAL_TABLET | ORAL | Status: DC

## 2015-06-12 MED ORDER — ESTRADIOL 0.5 MG PO TABS: 0.5000 mg | ORAL_TABLET | Freq: Every day | ORAL | Status: DC

## 2015-06-12 NOTE — Patient Instructions (Addendum)
Call insurance about shingles vaccine Monitor BP at home Monitor your blood pressure at home, if it is above 140/90 consistently call the office so we can adjust your medications. Go to the ER if any CP, SOB, nausea, dizziness, severe HA, changes vision/speech  DASH Eating Plan DASH stands for "Dietary Approaches to Stop Hypertension." The DASH eating plan is a healthy eating plan that has been shown to reduce high blood pressure (hypertension). Additional health benefits may include reducing the risk of type 2 diabetes mellitus, heart disease, and stroke. The DASH eating plan may also help with weight loss. WHAT DO I NEED TO KNOW ABOUT THE DASH EATING PLAN? For the DASH eating plan, you will follow these general guidelines:  Choose foods with a percent daily value for sodium of less than 5% (as listed on the food label).  Use salt-free seasonings or herbs instead of table salt or sea salt.  Check with your health care provider or pharmacist before using salt substitutes.  Eat lower-sodium products, often labeled as "lower sodium" or "no salt added."  Eat fresh foods.  Eat more vegetables, fruits, and low-fat dairy products.  Choose whole grains. Look for the word "whole" as the first word in the ingredient list.  Choose fish and skinless chicken or [place] more often than red meat. Limit fish, poultry, and meat to 6 oz (170 g) each day.  Limit sweets, desserts, sugars, and sugary drinks.  Choose heart-healthy fats.  Limit cheese to 1 oz (28 g) per day.  Eat more home-cooked food and less restaurant, buffet, and fast food.  Limit fried foods.  Cook foods using methods other than frying.  Limit canned vegetables. If you do use them, rinse them well to decrease the sodium.  When eating at a restaurant, ask that your food be prepared with less salt, or no salt if possible. WHAT FOODS CAN I EAT? Seek help from a dietitian for individual calorie needs. Grains Whole grain or whole  wheat bread. Brown rice. Whole grain or whole wheat pasta. Quinoa, bulgur, and whole grain cereals. Low-sodium cereals. Corn or whole wheat flour tortillas. Whole grain cornbread. Whole grain crackers. Low-sodium crackers. Vegetables Fresh or frozen vegetables (raw, steamed, roasted, or grilled). Low-sodium or reduced-sodium tomato and vegetable juices. Low-sodium or reduced-sodium tomato sauce and paste. Low-sodium or reduced-sodium canned vegetables.  Fruits All fresh, canned (in natural juice), or frozen fruits. Meat and Other Protein Products Ground beef (85% or leaner), grass-fed beef, or beef trimmed of fat. Skinless chicken or [place]. Ground chicken or [place]. Pork trimmed of fat. All fish and seafood. Eggs. Dried beans, peas, or lentils. Unsalted nuts and seeds. Unsalted canned beans. Dairy Low-fat dairy products, such as skim or 1% milk, 2% or reduced-fat cheeses, low-fat ricotta or cottage cheese, or plain low-fat yogurt. Low-sodium or reduced-sodium cheeses. Fats and Oils Tub margarines without trans fats. Light or reduced-fat mayonnaise and salad dressings (reduced sodium). Avocado. Safflower, olive, or canola oils. Natural peanut or almond butter. Other Unsalted popcorn and pretzels. The items listed above may not be a complete list of recommended foods or beverages. Contact your dietitian for more options. WHAT FOODS ARE NOT RECOMMENDED? Grains White bread. White pasta. White rice. Refined cornbread. Bagels and croissants. Crackers that contain trans fat. Vegetables Creamed or fried vegetables. Vegetables in a cheese sauce. Regular canned vegetables. Regular canned tomato sauce and paste. Regular tomato and vegetable juices. Fruits Dried fruits. Canned fruit in light or heavy syrup. Fruit juice. Meat and Other  Protein Products Fatty cuts of meat. Ribs, chicken wings, bacon, sausage, bologna, salami, chitterlings, fatback, hot dogs, bratwurst, and packaged luncheon meats. Salted  nuts and seeds. Canned beans with salt. Dairy Whole or 2% milk, cream, half-and-half, and cream cheese. Whole-fat or sweetened yogurt. Full-fat cheeses or blue cheese. Nondairy creamers and whipped toppings. Processed cheese, cheese spreads, or cheese curds. Condiments Onion and garlic salt, seasoned salt, table salt, and sea salt. Canned and packaged gravies. Worcestershire sauce. Tartar sauce. Barbecue sauce. Teriyaki sauce. Soy sauce, including reduced sodium. Steak sauce. Fish sauce. Oyster sauce. Cocktail sauce. Horseradish. Ketchup and mustard. Meat flavorings and tenderizers. Bouillon cubes. Hot sauce. Tabasco sauce. Marinades. Taco seasonings. Relishes. Fats and Oils Butter, stick margarine, lard, shortening, ghee, and bacon fat. Coconut, palm kernel, or palm oils. Regular salad dressings. Other Pickles and olives. Salted popcorn and pretzels. The items listed above may not be a complete list of foods and beverages to avoid. Contact your dietitian for more information. WHERE CAN I FIND MORE INFORMATION? National Heart, Lung, and Blood Institute: travelstabloid.com Document Released: 12/05/2011 Document Revised: 05/02/2014 Document Reviewed: 10/20/2013 [facility] Patient Information 2015 [place], [place]. This information is not intended to replace advice given to you by your health care provider. Make sure you discuss any questions you have with your health care provider.   Vitamin D goal is between 60-80  Please make sure that you are taking your Vitamin D as directed.   It is very important as a natural anti-inflammatory   helping hair, skin, and nails, as well as reducing stroke and heart attack risk.   It helps your bones and helps with mood.  It also decreases numerous cancer risks so please take it as directed.   Low Vit D is associated with a 200-300% higher risk for CANCER   and 200-300% higher risk for HEART   ATTACK  &  STROKE.     .....................................Marland Kitchen  It is also associated with higher death rate at younger ages,   autoimmune diseases like Rheumatoid arthritis, Lupus, Multiple Sclerosis.     Also many other serious conditions, like depression, Alzheimer's  Dementia, infertility, muscle aches, fatigue, fibromyalgia - just to name a few.  +++++++++++++++++++   Add ENTERIC COATED low dose 81 mg Aspirin daily OR can do every other day if you have easy bruising to protect your heart and head. As well as to reduce risk of Colon Cancer by 20 %, Skin Cancer by 26 % , Melanoma by 46% and Pancreatic cancer by 60%  GETTING OFF OF PPI's    Nexium/protonix/prilosec/Omeprazole/Dexilant/Aciphex are called PPI's, they are great at healing your stomach but should only be taken for a short period of time.     Recent studies have shown that taken for a long time they  can increase the risk of osteoporosis (weakening of your bones), pneumonia, low magnesium, restless legs, Cdiff (infection that causes diarrhea), DEMENTIA and most recently kidney damage / disease / insufficiency.     Due to this information we want to try to stop the PPI but if you try to stop it abruptly this can cause rebound acid and worsening symptoms.   So this is how we want you to get off the PPI:  - Start taking the nexium/protonix/prilosec/PPI  every other day with  zantac (ranitidine) 310m  2 x a day for 2-4 weeks  - then decrease the PPI to every 3 days while taking the zantac (ranitidine) twice a day the other  days for  2-4  Weeks  - then you can try the zantac (ranitidine) once at night or up to 2 x day as needed.  - you can continue on this once at night or stop all together  - Avoid alcohol, spicy foods, NSAIDS (aleve, ibuprofen) at this time. See foods below.   +++++++++++++++++++++++++++++++++++++++++++  Food Choices for Gastroesophageal Reflux Disease  When you have gastroesophageal reflux disease (GERD), the foods  you eat and your eating habits are very important. Choosing the right foods can help ease the discomfort of GERD. WHAT GENERAL GUIDELINES DO I NEED TO FOLLOW?  Choose fruits, vegetables, whole grains, low-fat dairy products, and low-fat meat, fish, and poultry.  Limit fats such as oils, salad dressings, butter, nuts, and avocado.  Keep a food diary to identify foods that cause symptoms.  Avoid foods that cause reflux. These may be different for different people.  Eat frequent small meals instead of three large meals each day.  Eat your meals slowly, in a relaxed setting.  Limit fried foods.  Cook foods using methods other than frying.  Avoid drinking alcohol.  Avoid drinking large amounts of liquids with your meals.  Avoid bending over or lying down until 2-3 hours after eating.   WHAT FOODS ARE NOT RECOMMENDED? The following are some foods and drinks that may worsen your symptoms:  Vegetables Tomatoes. Tomato juice. Tomato and spaghetti sauce. Chili peppers. Onion and garlic. Horseradish. Fruits Oranges, grapefruit, and lemon (fruit and juice). Meats High-fat meats, fish, and poultry. This includes hot dogs, ribs, ham, sausage, salami, and bacon. Dairy Whole milk and chocolate milk. Sour cream. Cream. Butter. Ice cream. Cream cheese.  Beverages Coffee and tea, with or without caffeine. Carbonated beverages or energy drinks. Condiments Hot sauce. Barbecue sauce.  Sweets/Desserts Chocolate and cocoa. Donuts. Peppermint and spearmint. Fats and Oils High-fat foods, including [place] fries and potato chips. Other Vinegar. Strong spices, such as black pepper, white pepper, red pepper, cayenne, curry powder, cloves, ginger, and chili powder. Nexium/protonix/prilosec are called PPI's, they are great at healing your stomach but should only be taken for a short period of time.

## 2015-06-12 NOTE — Progress Notes (Addendum)
Complete Physical  Assessment and Plan: 1. Stress incontinence Continue myrebtriq, card given, may call to get referral to PT or urology.  With hematuria and symptoms will refer to urology.   2. Essential hypertension - continue medications, monitor at home if greater than 130/80 will add on low dose BB, DASH diet, exercise and monitor at home.  - losartan (COZAAR) 100 MG tablet; Take 1 tablet (100 mg total) by mouth daily.  Dispense: 90 tablet; Refill: 3 - CBC with Differential/Platelet - Hepatic function panel - BASIC METABOLIC PANEL WITH GFR - Urinalysis, Routine w reflex microscopic (not at [facility]) - Microalbumin / creatinine urine ratio - EKG 12-Lead  3. Prediabetes Discussed general issues about diabetes pathophysiology and management., Educational material distributed., Suggested low cholesterol diet., Encouraged aerobic exercise., Discussed foot care., Reminded to get yearly retinal exam. - Hemoglobin A1c - Insulin, fasting  4. Thyroid disease Hypothyroidism-check TSH level, continue medications the same, reminded to take on an empty stomach 30-43mns before food.  - TSH  5. Hyperlipidemia -continue medications, check lipids, decrease fatty foods, increase activity.  - Lipid panel  6. Vitamin D deficiency - Vit D  25 hydroxy (rtn osteoporosis monitoring)  7. Medication management - Magnesium  8. Depression Depression- continue medications, stress management techniques discussed, increase water, good sleep hygiene discussed, increase exercise, and increase veggies.   9. Anemia, unspecified anemia type - Iron and TIBC - Ferritin - Vitamin B12  10. Gastroesophageal reflux disease without esophagitis GERD- will try to get off PPiI given info for taper and zantac sent in  11. NASH (nonalcoholic steatohepatitis) Check labs, avoid tylenol, alcohol, weight loss advised.   12. History of basal cell cancer Follow up Dr. TDenna Haggard 13. Screening for viral disease - HIV  antibody - RPR - Hepatitis C antibody   Discussed med's effects and SE's. Screening labs and tests as requested with regular follow-up as recommended.  [name]  presents for a complete physical. Her blood pressure is unknown if it is controlled at home, does not check it, today their BP is BP: (!) 154/82 mmHg, recheck 140/80 She does workout. She denies chest pain, shortness of breath, dizziness.  She is on cholesterol medication, vytorin 10/40 and denies myalgias. Her cholesterol is at goal. The cholesterol last visit was:   Lab Results  Component Value Date   CHOL 185 02/23/2015   HDL 75 02/23/2015   LDLCALC 82 02/23/2015   TRIG 142 02/23/2015   CHOLHDL 2.5 02/23/2015   She is prediabetic, denies Dm polys. Last A1C in the office was:  Lab Results  Component Value Date   HGBA1C 6.1* 02/23/2015   [name], on 5000 IU daily. Lab Results  Component Value Date   VD25OH 37 02/23/2015     She is on thyroid medication. Her medication was not changed last visit. Patient denies nervousness, palpitations and weight changes.  Lab Results  Component Value Date   TSH 1.214 02/23/2015   She has been tried on Vesicare and was unable to tolerate due to constipation, she is on myrbetriq 50 but cuts in half and states it helps significantly with OAB/incontinence but she still has incontinence with stress like running, sneezing, etc. She is on estrace 0.551mand on bASA.   She is on wellbutrin for depression which helps.   [place] Hep[place] C [place] and has not had HIV screening.   BMI is Body mass  index is 27.6 kg/(m^2)., she is working on diet and exercise. Wt Readings from Last 3 Encounters:  06/12/15 187 lb (84.823 kg)  02/23/15 193 lb (87.544 kg)  11/10/14 187 lb 9.6 oz (85.095 kg)    Current Medications:  Current Outpatient Prescriptions on File Prior to Visit  Medication Sig Dispense Refill  .  aspirin 81 MG chewable tablet Chew by mouth daily.    Marland Kitchen buPROPion (WELLBUTRIN XL) 150 MG 24 hr tablet Take 1 tablet (150 mg total) by mouth daily. 90 tablet 0  . Cholecalciferol (VITAMIN D PO) Take 1,000 Int'l Units by mouth daily.    Marland Kitchen estradiol (ESTRACE) 0.5 MG tablet Take 1 tablet (0.5 mg total) by mouth daily. 90 tablet 1  . levothyroxine (SYNTHROID, LEVOTHROID) 100 MCG tablet TAKE 1 TABLET DAILY 90 tablet 1  . losartan (COZAAR) 100 MG tablet Take 1 tablet (100 mg total) by mouth daily. 90 tablet 3  . Magnesium 500 MG TABS Take 1 tablet by mouth daily.    Marland Kitchen MAGNESIUM PO Take by mouth daily.    . mirabegron ER (MYRBETRIQ) 50 MG TB24 tablet Take 1 tablet (50 mg total) by mouth daily. 30 tablet 0  . Multiple Vitamins-Minerals (MULTIVITAMIN PO) Take by mouth daily.    Marland Kitchen omeprazole (PRILOSEC) 20 MG capsule TAKE 1 BY MOUTH DAILY 90 capsule 0  . OVER THE COUNTER MEDICATION Tumeric daily    . VYTORIN 10-40 MG per tablet TAKE 1 BY MOUTH DAILY 90 tablet 1   No current facility-administered medications on file prior to visit.   Health Maintenance:   Immunization History  Administered Date(s) Administered  . Pneumococcal Polysaccharide-23 03/25/2006  . Td 03/15/2004  . Tdap 06/02/2014   Tetanus: 2015 Pneumovax: N/A Prevnar 13: due age 32 Flu vaccine: 2015 at work for free  Zostavax: will call insurance Pap: 07/2014 normal MGM:12/2014 3D MGM DEXA: 2007 Colonoscopy: 04/2014 due 10 years EGD: N/A CT AB 2003 AB [place] 2006 CXR 04/2012  Patient Care Team: Unk Pinto, MD as PCP - General (Internal Medicine) Melissa Noon, Witt as Referring Physician (Optometry) Lavonna Monarch, MD as Consulting Physician (Dermatology) Inda Castle, MD as Consulting Physician (Gastroenterology) Bobbye Charleston, MD as Consulting Physician (Obstetrics and Gynecology) Elsie Saas, MD as Consulting Physician (Orthopedic Surgery)   Allergies:  Allergies  Allergen Reactions  . Lotensin [Benazepril Hcl]      Not effective for pt  . Naldecon Senior [Guaifenesin]     Unsure of reaction  . Prednisone     Unsure of reaction   Medical History:  Past Medical History  Diagnosis Date  . Colon polyp, hyperplastic     2005  . Unspecified essential hypertension   . Hyperlipidemia   . Anemia   . Thyroid disease   . Prediabetes   . GERD (gastroesophageal reflux disease)   . Depression   . Vitamin D deficiency   . Anxiety   . Cancer     basal cell CA- nose   Surgical History:  Past Surgical History  Procedure Laterality Date  . Tonsillectomy    . Thyroidectomy    . Vaginal hysterectomy    . Breast lumpectomy    . Colonoscopy    . Mohs surgery    . Knee arthroscopy      knee "cleanup" of right knee cartiledge   Family History:  Family History  Problem Relation Age of Onset  . Heart disease Mother   . Hypertension Mother   . Hypertension Father   .  Alzheimer's disease Father   . Colon cancer Neg Hx   . Esophageal cancer Neg Hx   . Rectal cancer Neg Hx   . Stomach cancer Neg Hx    Social History:  History  Substance Use Topics  . Smoking status: Former Smoker    Quit date: 02/23/2005  . Smokeless tobacco: Never Used  . Alcohol Use: 0.0 oz/week    0 Standard drinks or equivalent per week     Comment: on Fridays- wine   Review of Systems  Constitutional: Negative.   HENT: Negative.   Eyes: Negative.   Respiratory: Negative.   Cardiovascular: Negative.   Gastrointestinal: Positive for heartburn (better with prilosec). Negative for nausea, vomiting, abdominal pain, diarrhea, constipation, blood in stool and melena.  Genitourinary: Negative.   Musculoskeletal: Positive for joint pain (knees, bursitis in left knee occ). Negative for myalgias, back pain, falls and neck pain.  Skin: Negative.   Neurological: Negative.   Endo/Heme/Allergies: Negative.   Psychiatric/Behavioral: Negative.     Physical Exam: Estimated body mass index is 27.6 kg/(m^2) as calculated from the  following:   Height as of this encounter: 5' 9"  (1.753 m).   Weight as of this encounter: 187 lb (84.823 kg). BP 154/82 mmHg  Pulse 86  Temp(Src) 98 F (36.7 C) (Temporal)  Resp 18  Ht 5' 9"  (1.753 m)  Wt 187 lb (84.823 kg)  BMI 27.60 kg/m2 General Appearance: Well nourished, in no apparent distress. Eyes: PERRLA, EOMs, conjunctiva no swelling or erythema, normal fundi and vessels. Sinuses: No Frontal/maxillary tenderness ENT/Mouth: Ext aud canals clear, normal light reflex with TMs without erythema, bulging.  Good dentition. No erythema, swelling, or exudate on post pharynx. Tonsils not swollen or erythematous. Hearing normal.  Neck: Supple, thyroid normal. No bruits Respiratory: Respiratory effort normal, BS equal bilaterally without rales, rhonchi, wheezing or stridor. Cardio: RRR without murmurs, rubs or gallops. Brisk peripheral pulses without edema.  Chest: symmetric, with normal excursions and percussion. Breasts: defer Abdomen: Soft, +BS, RUQ tenderness, negative murphy's, no guarding, rebound, hernias, masses, or organomegaly. .  Lymphatics: Non tender without lymphadenopathy.  Genitourinary: defer Musculoskeletal: Full ROM all peripheral extremities,5/5 strength, and normal gait. Skin: Warm, dry without rashes, lesions, ecchymosis.  Neuro: Cranial nerves intact, reflexes equal bilaterally. Normal muscle tone, no cerebellar symptoms. Sensation intact.  Psych: Awake and oriented X 3, normal affect, Insight and Judgment appropriate.   EKG: WNL AORTA SCAN: defer   Vicie Mutters 2:07 PM

## 2015-06-13 LAB — BASIC METABOLIC PANEL WITH GFR
BUN: 17 mg/dL (ref 6–23)
CALCIUM: 9.2 mg/dL (ref 8.4–10.5)
CO2: 28 mEq/L (ref 19–32)
CREATININE: 0.92 mg/dL (ref 0.50–1.10)
Chloride: 105 mEq/L (ref 96–112)
GFR, EST NON AFRICAN AMERICAN: 67 mL/min
GFR, Est African American: 77 mL/min
GLUCOSE: 86 mg/dL (ref 70–99)
Potassium: 3.8 mEq/L (ref 3.5–5.3)
Sodium: 141 mEq/L (ref 135–145)

## 2015-06-13 LAB — URINALYSIS, ROUTINE W REFLEX MICROSCOPIC
BILIRUBIN URINE: NEGATIVE
GLUCOSE, UA: NEGATIVE mg/dL
KETONES UR: NEGATIVE mg/dL
Leukocytes, UA: NEGATIVE
NITRITE: NEGATIVE
Protein, ur: NEGATIVE mg/dL
Specific Gravity, Urine: 1.011 (ref 1.005–1.030)
Urobilinogen, UA: 0.2 mg/dL (ref 0.0–1.0)
pH: 5.5 (ref 5.0–8.0)

## 2015-06-13 LAB — RPR

## 2015-06-13 LAB — IRON AND TIBC
%SAT: 13 % — ABNORMAL LOW (ref 20–55)
IRON: 52 ug/dL (ref 42–145)
TIBC: 397 ug/dL (ref 250–470)
UIBC: 345 ug/dL (ref 125–400)

## 2015-06-13 LAB — MAGNESIUM: MAGNESIUM: 1.8 mg/dL (ref 1.5–2.5)

## 2015-06-13 LAB — URINALYSIS, MICROSCOPIC ONLY
BACTERIA UA: NONE SEEN
CASTS: NONE SEEN
Crystals: NONE SEEN
Squamous Epithelial / LPF: NONE SEEN

## 2015-06-13 LAB — CBC WITH DIFFERENTIAL/PLATELET
BASOS PCT: 0 % (ref 0–1)
Basophils Absolute: 0 10*3/uL (ref 0.0–0.1)
EOS ABS: 0.1 10*3/uL (ref 0.0–0.7)
Eosinophils Relative: 1 % (ref 0–5)
HEMATOCRIT: 38.3 % (ref 36.0–46.0)
HEMOGLOBIN: 12.5 g/dL (ref 12.0–15.0)
LYMPHS ABS: 1.8 10*3/uL (ref 0.7–4.0)
Lymphocytes Relative: 22 % (ref 12–46)
MCH: 29.2 pg (ref 26.0–34.0)
MCHC: 32.6 g/dL (ref 30.0–36.0)
MCV: 89.5 fL (ref 78.0–100.0)
MONO ABS: 0.6 10*3/uL (ref 0.1–1.0)
MONOS PCT: 8 % (ref 3–12)
MPV: 9.2 fL (ref 8.6–12.4)
NEUTROS PCT: 69 % (ref 43–77)
Neutro Abs: 5.5 10*3/uL (ref 1.7–7.7)
Platelets: 269 10*3/uL (ref 150–400)
RBC: 4.28 MIL/uL (ref 3.87–5.11)
RDW: 13.4 % (ref 11.5–15.5)
WBC: 8 10*3/uL (ref 4.0–10.5)

## 2015-06-13 LAB — INSULIN, FASTING: INSULIN FASTING, SERUM: 15.6 u[IU]/mL (ref 2.0–19.6)

## 2015-06-13 LAB — LIPID PANEL
Cholesterol: 192 mg/dL (ref 0–200)
HDL: 65 mg/dL (ref >=46)
LDL Cholesterol: 77 mg/dL (ref 0–99)
TRIGLYCERIDES: 252 mg/dL — AB (ref <150)
Total CHOL/HDL Ratio: 3 Ratio
VLDL: 50 mg/dL — ABNORMAL HIGH (ref 0–40)

## 2015-06-13 LAB — HEPATIC FUNCTION PANEL
ALK PHOS: 59 U/L (ref 39–117)
ALT: 19 U/L (ref 0–35)
AST: 15 U/L (ref 0–37)
Albumin: 4.2 g/dL (ref 3.5–5.2)
BILIRUBIN DIRECT: 0.1 mg/dL (ref 0.0–0.3)
BILIRUBIN TOTAL: 0.2 mg/dL (ref 0.2–1.2)
Indirect Bilirubin: 0.1 mg/dL — ABNORMAL LOW (ref 0.2–1.2)
Total Protein: 6.4 g/dL (ref 6.0–8.3)

## 2015-06-13 LAB — MICROALBUMIN / CREATININE URINE RATIO
CREATININE, URINE: 79 mg/dL
MICROALB UR: 1.9 mg/dL (ref <2.0)
Microalb Creat Ratio: 24.1 mg/g (ref 0.0–30.0)

## 2015-06-13 LAB — HEPATITIS C ANTIBODY: HCV AB: NEGATIVE

## 2015-06-13 LAB — HIV ANTIBODY (ROUTINE TESTING W REFLEX): HIV: NONREACTIVE

## 2015-06-13 LAB — VITAMIN D 25 HYDROXY (VIT D DEFICIENCY, FRACTURES): Vit D, 25-Hydroxy: 40 ng/mL (ref 30–100)

## 2015-06-13 LAB — VITAMIN B12: Vitamin B-12: 538 pg/mL (ref 211–911)

## 2015-06-13 LAB — FERRITIN: FERRITIN: 48 ng/mL (ref 10–291)

## 2015-06-13 LAB — HEMOGLOBIN A1C
Hgb A1c MFr Bld: 5.7 % — ABNORMAL HIGH (ref <5.7)
Mean Plasma Glucose: 117 mg/dL — ABNORMAL HIGH (ref <117)

## 2015-06-13 LAB — TSH: TSH: 0.688 u[IU]/mL (ref 0.350–4.500)

## 2015-06-13 NOTE — Addendum Note (Signed)
Addended by: Vicie Mutters R on: 06/13/2015 03:08 PM   Modules accepted: Orders, SmartSet

## 2015-06-26 ENCOUNTER — Encounter: Payer: Self-pay | Admitting: Internal Medicine

## 2015-08-11 ENCOUNTER — Other Ambulatory Visit: Payer: Self-pay | Admitting: Internal Medicine

## 2015-09-14 ENCOUNTER — Encounter: Payer: Self-pay | Admitting: Physician Assistant

## 2015-09-14 ENCOUNTER — Ambulatory Visit: Payer: Self-pay | Admitting: Physician Assistant

## 2015-12-01 ENCOUNTER — Ambulatory Visit (INDEPENDENT_AMBULATORY_CARE_PROVIDER_SITE_OTHER): Payer: BLUE CROSS/BLUE SHIELD | Admitting: Physician Assistant

## 2015-12-01 ENCOUNTER — Encounter: Payer: Self-pay | Admitting: Physician Assistant

## 2015-12-01 VITALS — BP 120/80 | HR 81 | Temp 97.7°F | Resp 16 | Ht 69.0 in | Wt 191.0 lb

## 2015-12-01 DIAGNOSIS — Z79899 Other long term (current) drug therapy: Secondary | ICD-10-CM | POA: Diagnosis not present

## 2015-12-01 DIAGNOSIS — I10 Essential (primary) hypertension: Secondary | ICD-10-CM | POA: Diagnosis not present

## 2015-12-01 DIAGNOSIS — R7303 Prediabetes: Secondary | ICD-10-CM

## 2015-12-01 DIAGNOSIS — M545 Low back pain, unspecified: Secondary | ICD-10-CM

## 2015-12-01 DIAGNOSIS — E079 Disorder of thyroid, unspecified: Secondary | ICD-10-CM | POA: Diagnosis not present

## 2015-12-01 DIAGNOSIS — E785 Hyperlipidemia, unspecified: Secondary | ICD-10-CM

## 2015-12-01 DIAGNOSIS — E559 Vitamin D deficiency, unspecified: Secondary | ICD-10-CM | POA: Diagnosis not present

## 2015-12-01 MED ORDER — PHENTERMINE HCL 37.5 MG PO TABS: 37.5000 mg | ORAL_TABLET | Freq: Every day | ORAL | Status: DC

## 2015-12-01 NOTE — Patient Instructions (Signed)
Phentermine  While taking the medication we may ask that you come into the office once a month or once every 2-3 months to monitor your weight, blood pressure, and heart rate. In addition we can help answer your questions about diet, exercise, and help you every step of the way with your weight loss journey. Sometime it is helpful if you bring in a food diary or use an app on your phone such as myfitnesspal to record your calorie intake, especially in the beginning.   You can start out on 1/3 to 1/2 a pill in the morning and if you are tolerating it well you can increase to one pill daily. I also have some patients that take 1/3 or 1/2 at lunch to help prevent night time eating.  This medication is cheapest CASH pay at [facility] is 14-17 dollars and you do NOT need a membership to get meds from there.    What is this medicine? PHENTERMINE (FEN ter meen) decreases your appetite. This medicine is intended to be used in addition to a healthy reduced calorie diet and exercise. The best results are achieved this way. This medicine is only indicated for short-term use. Eventually your weight loss may level out and the medication will no longer be needed.   How should I use this medicine? Take this medicine by mouth. Follow the directions on the prescription label. The tablets should stay in the bottle until immediately before you take your dose. Take your doses at regular intervals. Do not take your medicine more often than directed.  Overdosage: If you think you have taken too much of this medicine contact a poison control center or emergency room at once. NOTE: This medicine is only for you. Do not share this medicine with others.  What if I miss a dose? If you miss a dose, take it as soon as you can. If it is almost time for your next dose, take only that dose. Do not take double or extra doses. Do not increase or in any way change your dose without consulting your  doctor.  What should I watch for while using this medicine? Notify your physician immediately if you become short of breath while doing your normal activities. Do not take this medicine within 6 hours of bedtime. It can keep you from getting to sleep. Avoid drinks that contain caffeine and try to stick to a regular bedtime every night. Do not stand or sit up quickly, especially if you are an older patient. This reduces the risk of dizzy or fainting spells. Avoid alcoholic drinks.  What side effects may I notice from receiving this medicine? Side effects that you should report to your doctor or health care professional as soon as possible: -chest pain, palpitations -depression or severe changes in mood -increased blood pressure -irritability -nervousness or restlessness -severe dizziness -shortness of breath -problems urinating -unusual swelling of the legs -vomiting  Side effects that usually do not require medical attention (report to your doctor or health care professional if they continue or are bothersome): -blurred vision or other eye problems -changes in sexual ability or desire -constipation or diarrhea -difficulty sleeping -dry mouth or unpleasant taste -headache -nausea This list may not describe all possible side effects. Call your doctor for medical advice about side effects. You may report side effects to FDA at 1-800-FDA-1088.   Back Pain, Adult Back pain is very common in adults.The cause of back pain is rarely dangerous and the  pain often gets better over time.The cause of your back pain may not be known. Some common causes of back pain include:  Strain of the muscles or ligaments supporting the spine.  Wear and tear (degeneration) of the spinal disks.  Arthritis.  Direct injury to the back. For many people, back pain may return. Since back pain is rarely dangerous, most people can learn to manage this condition on their own. HOME CARE INSTRUCTIONS Watch  your back pain for any changes. The following actions may help to lessen any discomfort you are feeling:  Remain active. It is stressful on your back to sit or stand in one place for long periods of time. Do not sit, drive, or stand in one place for more than 30 minutes at a time. Take short walks on even surfaces as soon as you are able.Try to increase the length of time you walk each day.  Exercise regularly as directed by your health care provider. Exercise helps your back heal faster. It also helps avoid future injury by keeping your muscles strong and flexible.  Do not stay in bed.Resting more than 1-2 days can delay your recovery.  Pay attention to your body when you bend and lift. The most comfortable positions are those that put less stress on your recovering back. Always use proper lifting techniques, including:  Bending your knees.  Keeping the load close to your body.  Avoiding twisting.  Find a comfortable position to sleep. Use a firm mattress and lie on your side with your knees slightly bent. If you lie on your back, put a pillow under your knees.  Avoid feeling anxious or stressed.Stress increases muscle tension and can worsen back pain.It is important to recognize when you are anxious or stressed and learn ways to manage it, such as with exercise.  Take medicines only as directed by your health care provider. Over-the-counter medicines to reduce pain and inflammation are often the most helpful.Your health care provider may prescribe muscle relaxant drugs.These medicines help dull your pain so you can more quickly return to your normal activities and healthy exercise.  Apply ice to the injured area:  Put ice in a plastic bag.  Place a towel between your skin and the bag.  Leave the ice on for 20 minutes, 2-3 times a day for the first 2-3 days. After that, ice and heat may be alternated to reduce pain and spasms.  Maintain a healthy weight. Excess weight puts extra  stress on your back and makes it difficult to maintain good posture. SEEK MEDICAL CARE IF:  You have pain that is not relieved with rest or medicine.  You have increasing pain going down into the legs or buttocks.  You have pain that does not improve in one week.  You have night pain.  You lose weight.  You have a fever or chills. SEEK IMMEDIATE MEDICAL CARE IF:   You develop new bowel or bladder control problems.  You have unusual weakness or numbness in your arms or legs.  You develop nausea or vomiting.  You develop abdominal pain.  You feel faint.   This information is not intended to replace advice given to you by your health care provider. Make sure you discuss any questions you have with your health care provider.  Low Back Sprain With Rehab A sprain is an injury in which a ligament is torn. The ligaments of the lower back are vulnerable to sprains. However, they are strong and require great force  to be injured. These ligaments are important for stabilizing the spinal column. Sprains are classified into three categories. Grade 1 sprains cause pain, but the tendon is not lengthened. Grade 2 sprains include a lengthened ligament, due to the ligament being stretched or partially ruptured. With grade 2 sprains there is still function, although the function may be decreased. Grade 3 sprains involve a complete tear of the tendon or muscle, and function is usually impaired. SYMPTOMS   Severe pain in the lower back.  Sometimes, a feeling of a "pop," "snap," or tear, at the time of injury.  Tenderness and sometimes swelling at the injury site.  Uncommonly, bruising (contusion) within 48 hours of injury.  Muscle spasms in the back. CAUSES  Low back sprains occur when a force is placed on the ligaments that is greater than they can handle. Common causes of injury include:  Performing a stressful act while off-balance.  Repetitive stressful activities that involve movement of  the lower back.  Direct hit (trauma) to the lower back. RISK INCREASES WITH:  Contact sports (football, wrestling).  Collisions (major skiing accidents).  Sports that require throwing or lifting (baseball, weightlifting).  Sports involving twisting of the spine (gymnastics, diving, tennis, golf).  Poor strength and flexibility.  Inadequate protection.  Previous back injury or surgery (especially fusion). PREVENTION  Wear properly fitted and padded protective equipment.  Warm up and stretch properly before activity.  Allow for adequate recovery between workouts.  Maintain physical fitness:  Strength, flexibility, and endurance.  Cardiovascular fitness.  Maintain a healthy body weight. PROGNOSIS  If treated properly, low back sprains usually heal with non-surgical treatment. The length of time for healing depends on the severity of the injury.  RELATED COMPLICATIONS   Recurring symptoms, resulting in a chronic problem.  Chronic inflammation and pain in the low back.  Delayed healing or resolution of symptoms, especially if activity is resumed too soon.  Prolonged impairment.  Unstable or arthritic joints of the low back. TREATMENT  Treatment first involves the use of ice and medicine, to reduce pain and inflammation. The use of strengthening and stretching exercises may help reduce pain with activity. These exercises may be performed at home or with a therapist. Severe injuries may require referral to a therapist for further evaluation and treatment, such as ultrasound. Your caregiver may advise that you wear a back brace or corset, to help reduce pain and discomfort. Often, prolonged bed rest results in greater harm then benefit. Corticosteroid injections may be recommended. However, these should be reserved for the most serious cases. It is important to avoid using your back when lifting objects. At night, sleep on your back on a firm mattress, with a pillow placed  under your knees. If non-surgical treatment is unsuccessful, surgery may be needed.  MEDICATION   If pain medicine is needed, nonsteroidal anti-inflammatory medicines (aspirin and ibuprofen), or other minor pain relievers (acetaminophen), are often advised.  Do not take pain medicine for 7 days before surgery.  Prescription pain relievers may be given, if your caregiver thinks they are needed. Use only as directed and only as much as you need.  Ointments applied to the skin may be helpful.  Corticosteroid injections may be given by your caregiver. These injections should be reserved for the most serious cases, because they may only be given a certain number of times. HEAT AND COLD  Cold treatment (icing) should be applied for 10 to 15 minutes every 2 to 3 hours for inflammation and  pain, and immediately after activity that aggravates your symptoms. Use ice packs or an ice massage.  Heat treatment may be used before performing stretching and strengthening activities prescribed by your caregiver, physical therapist, or athletic trainer. Use a heat pack or a warm water soak. SEEK MEDICAL CARE IF:   Symptoms get worse or do not improve in 2 to 4 weeks, despite treatment.  You develop numbness or weakness in either leg.  You lose bowel or bladder function.  Any of the following occur after surgery: fever, increased pain, swelling, redness, drainage of fluids, or bleeding in the affected area.  New, unexplained symptoms develop. (Drugs used in treatment may produce side effects.) EXERCISES  RANGE OF MOTION (ROM) AND STRETCHING EXERCISES - Low Back Sprain Most people with lower back pain will find that their symptoms get worse with excessive bending forward (flexion) or arching at the lower back (extension). The exercises that will help resolve your symptoms will focus on the opposite motion.  Your physician, physical therapist or athletic trainer will help you determine which exercises will  be most helpful to resolve your lower back pain. Do not complete any exercises without first consulting with your caregiver. Discontinue any exercises which make your symptoms worse, until you speak to your caregiver. If you have pain, numbness or tingling which travels down into your buttocks, leg or foot, the goal of the therapy is for these symptoms to move closer to your back and eventually resolve. Sometimes, these leg symptoms will get better, but your lower back pain may worsen. This is often an indication of progress in your rehabilitation. Be very alert to any changes in your symptoms and the activities in which you participated in the 24 hours prior to the change. Sharing this information with your caregiver will allow him or her to most efficiently treat your condition. These exercises may help you when beginning to rehabilitate your injury. Your symptoms may resolve with or without further involvement from your physician, physical therapist or athletic trainer. While completing these exercises, remember:   Restoring tissue flexibility helps normal motion to return to the joints. This allows healthier, less painful movement and activity.  An effective stretch should be held for at least 30 seconds.  A stretch should never be painful. You should only feel a gentle lengthening or release in the stretched tissue. FLEXION RANGE OF MOTION AND STRETCHING EXERCISES: STRETCH - Flexion, Single Knee to Chest   Lie on a firm bed or floor with both legs extended in front of you.  Keeping one leg in contact with the floor, bring your opposite knee to your chest. Hold your leg in place by either grabbing behind your thigh or at your knee.  Pull until you feel a gentle stretch in your low back. Hold __________ seconds.  Slowly release your grasp and repeat the exercise with the opposite side. Repeat __________ times. Complete this exercise __________ times per day.  STRETCH - Flexion, Double Knee to  Chest  Lie on a firm bed or floor with both legs extended in front of you.  Keeping one leg in contact with the floor, bring your opposite knee to your chest.  Tense your stomach muscles to support your back and then lift your other knee to your chest. Hold your legs in place by either grabbing behind your thighs or at your knees.  Pull both knees toward your chest until you feel a gentle stretch in your low back. Hold __________ seconds.  Tense  your stomach muscles and slowly return one leg at a time to the floor. Repeat __________ times. Complete this exercise __________ times per day.  STRETCH - Low Trunk Rotation  Lie on a firm bed or floor. Keeping your legs in front of you, bend your knees so they are both pointed toward the ceiling and your feet are flat on the floor.  Extend your arms out to the side. This will stabilize your upper body by keeping your shoulders in contact with the floor.  Gently and slowly drop both knees together to one side until you feel a gentle stretch in your low back. Hold for __________ seconds.  Tense your stomach muscles to support your lower back as you bring your knees back to the starting position. Repeat the exercise to the other side. Repeat __________ times. Complete this exercise __________ times per day  EXTENSION RANGE OF MOTION AND FLEXIBILITY EXERCISES: STRETCH - Extension, Prone on Elbows   Lie on your stomach on the floor, a bed will be too soft. Place your palms about shoulder width apart and at the height of your head.  Place your elbows under your shoulders. If this is too painful, stack pillows under your chest.  Allow your body to relax so that your hips drop lower and make contact more completely with the floor.  Hold this position for __________ seconds.  Slowly return to lying flat on the floor. Repeat __________ times. Complete this exercise __________ times per day.  RANGE OF MOTION - Extension, Prone Press Ups  Lie on  your stomach on the floor, a bed will be too soft. Place your palms about shoulder width apart and at the height of your head.  Keeping your back as relaxed as possible, slowly straighten your elbows while keeping your hips on the floor. You may adjust the placement of your hands to maximize your comfort. As you gain motion, your hands will come more underneath your shoulders.  Hold this position __________ seconds.  Slowly return to lying flat on the floor. Repeat __________ times. Complete this exercise __________ times per day.  RANGE OF MOTION- Quadruped, Neutral Spine   Assume a hands and knees position on a firm surface. Keep your hands under your shoulders and your knees under your hips. You may place padding under your knees for comfort.  Drop your head and point your tailbone toward the ground below you. This will round out your lower back like an angry cat. Hold this position for __________ seconds.  Slowly lift your head and release your tail bone so that your back sags into a large arch, like an old horse.  Hold this position for __________ seconds.  Repeat this until you feel limber in your low back.  Now, find your "sweet spot." This will be the most comfortable position somewhere between the two previous positions. This is your neutral spine. Once you have found this position, tense your stomach muscles to support your low back.  Hold this position for __________ seconds. Repeat __________ times. Complete this exercise __________ times per day.  STRENGTHENING EXERCISES - Low Back Sprain These exercises may help you when beginning to rehabilitate your injury. These exercises should be done near your "sweet spot." This is the neutral, low-back arch, somewhere between fully rounded and fully arched, that is your least painful position. When performed in this safe range of motion, these exercises can be used for people who have either a flexion or extension based injury. These  exercises may resolve your symptoms with or without further involvement from your physician, physical therapist or athletic trainer. While completing these exercises, remember:   Muscles can gain both the endurance and the strength needed for everyday activities through controlled exercises.  Complete these exercises as instructed by your physician, physical therapist or athletic trainer. Increase the resistance and repetitions only as guided.  You may experience muscle soreness or fatigue, but the pain or discomfort you are trying to eliminate should never worsen during these exercises. If this pain does worsen, stop and make certain you are following the directions exactly. If the pain is still present after adjustments, discontinue the exercise until you can discuss the trouble with your caregiver. STRENGTHENING - Deep Abdominals, Pelvic Tilt   Lie on a firm bed or floor. Keeping your legs in front of you, bend your knees so they are both pointed toward the ceiling and your feet are flat on the floor.  Tense your lower abdominal muscles to press your low back into the floor. This motion will rotate your pelvis so that your tail bone is scooping upwards rather than pointing at your feet or into the floor. With a gentle tension and even breathing, hold this position for __________ seconds. Repeat __________ times. Complete this exercise __________ times per day.  STRENGTHENING - Abdominals, Crunches   Lie on a firm bed or floor. Keeping your legs in front of you, bend your knees so they are both pointed toward the ceiling and your feet are flat on the floor. Cross your arms over your chest.  Slightly tip your chin down without bending your neck.  Tense your abdominals and slowly lift your trunk high enough to just clear your shoulder blades. Lifting higher can put excessive stress on the lower back and does not further strengthen your abdominal muscles.  Control your return to the starting  position. Repeat __________ times. Complete this exercise __________ times per day.  STRENGTHENING - Quadruped, Opposite UE/LE Lift   Assume a hands and knees position on a firm surface. Keep your hands under your shoulders and your knees under your hips. You may place padding under your knees for comfort.  Find your neutral spine and gently tense your abdominal muscles so that you can maintain this position. Your shoulders and hips should form a rectangle that is parallel with the floor and is not twisted.  Keeping your trunk steady, lift your right hand no higher than your shoulder and then your left leg no higher than your hip. Make sure you are not holding your breath. Hold this position for __________ seconds.  Continuing to keep your abdominal muscles tense and your back steady, slowly return to your starting position. Repeat with the opposite arm and leg. Repeat __________ times. Complete this exercise __________ times per day.  STRENGTHENING - Abdominals and Quadriceps, Straight Leg Raise   Lie on a firm bed or floor with both legs extended in front of you.  Keeping one leg in contact with the floor, bend the other knee so that your foot can rest flat on the floor.  Find your neutral spine, and tense your abdominal muscles to maintain your spinal position throughout the exercise.  Slowly lift your straight leg off the floor about 6 inches for a count of 15, making sure to not hold your breath.  Still keeping your neutral spine, slowly lower your leg all the way to the floor. Repeat this exercise with each leg __________ times. Complete this  exercise __________ times per day. POSTURE AND BODY MECHANICS CONSIDERATIONS - Low Back Sprain Keeping correct posture when sitting, standing or completing your activities will reduce the stress put on different body tissues, allowing injured tissues a chance to heal and limiting painful experiences. The following are general guidelines for  improved posture. Your physician or physical therapist will provide you with any instructions specific to your needs. While reading these guidelines, remember:  The exercises prescribed by your provider will help you have the flexibility and strength to maintain correct postures.  The correct posture provides the best environment for your joints to work. All of your joints have less wear and tear when properly supported by a spine with good posture. This means you will experience a healthier, less painful body.  Correct posture must be practiced with all of your activities, especially prolonged sitting and standing. Correct posture is as important when doing repetitive low-stress activities (typing) as it is when doing a single heavy-load activity (lifting). RESTING POSITIONS Consider which positions are most painful for you when choosing a resting position. If you have pain with flexion-based activities (sitting, bending, stooping, squatting), choose a position that allows you to rest in a less flexed posture. You would want to avoid curling into a fetal position on your side. If your pain worsens with extension-based activities (prolonged standing, working overhead), avoid resting in an extended position such as sleeping on your stomach. Most people will find more comfort when they rest with their spine in a more neutral position, neither too rounded nor too arched. Lying on a non-sagging bed on your side with a pillow between your knees, or on your back with a pillow under your knees will often provide some relief. Keep in mind, being in any one position for a prolonged period of time, no matter how correct your posture, can still lead to stiffness. PROPER SITTING POSTURE In order to minimize stress and discomfort on your spine, you must sit with correct posture. Sitting with good posture should be effortless for a healthy body. Returning to good posture is a gradual process. Many people can work toward  this most comfortably by using various supports until they have the flexibility and strength to maintain this posture on their own. When sitting with proper posture, your ears will fall over your shoulders and your shoulders will fall over your hips. You should use the back of the chair to support your upper back. Your lower back will be in a neutral position, just slightly arched. You may place a small pillow or folded towel at the base of your lower back for  support.  When working at a desk, create an environment that supports good, upright posture. Without extra support, muscles tire, which leads to excessive strain on joints and other tissues. Keep these recommendations in mind: CHAIR:  A chair should be able to slide under your desk when your back makes contact with the back of the chair. This allows you to work closely.  The chair's height should allow your eyes to be level with the upper part of your monitor and your hands to be slightly lower than your elbows. BODY POSITION  Your feet should make contact with the floor. If this is not possible, use a foot rest.  Keep your ears over your shoulders. This will reduce stress on your neck and low back. INCORRECT SITTING POSTURES  If you are feeling tired and unable to assume a healthy sitting posture, do not slouch or  slump. This puts excessive strain on your back tissues, causing more damage and pain. Healthier options include:  Using more support, like a lumbar pillow.  Switching tasks to something that requires you to be upright or walking.  Talking a brief walk.  Lying down to rest in a neutral-spine position. PROLONGED STANDING WHILE SLIGHTLY LEANING FORWARD  When completing a task that requires you to lean forward while standing in one place for a long time, place either foot up on a stationary 2-4 inch high object to help maintain the best posture. When both feet are on the ground, the lower back tends to lose its slight inward  curve. If this curve flattens (or becomes too large), then the back and your other joints will experience too much stress, tire more quickly, and can cause pain. CORRECT STANDING POSTURES Proper standing posture should be assumed with all daily activities, even if they only take a few moments, like when brushing your teeth. As in sitting, your ears should fall over your shoulders and your shoulders should fall over your hips. You should keep a slight tension in your abdominal muscles to brace your spine. Your tailbone should point down to the ground, not behind your body, resulting in an over-extended swayback posture.  INCORRECT STANDING POSTURES  Common incorrect standing postures include a forward head, locked knees and/or an excessive swayback. WALKING Walk with an upright posture. Your ears, shoulders and hips should all line-up. PROLONGED ACTIVITY IN A FLEXED POSITION When completing a task that requires you to bend forward at your waist or lean over a low surface, try to find a way to stabilize 3 out of 4 of your limbs. You can place a hand or elbow on your thigh or rest a knee on the surface you are reaching across. This will provide you more stability, so that your muscles do not tire as quickly. By keeping your knees relaxed, or slightly bent, you will also reduce stress across your lower back. CORRECT LIFTING TECHNIQUES DO :  Assume a wide stance. This will provide you more stability and the opportunity to get as close as possible to the object which you are lifting.  Tense your abdominals to brace your spine. Bend at the knees and hips. Keeping your back locked in a neutral-spine position, lift using your leg muscles. Lift with your legs, keeping your back straight.  Test the weight of unknown objects before attempting to lift them.  Try to keep your elbows locked down at your sides in order get the best strength from your shoulders when carrying an object.  Always ask for help when  lifting heavy or awkward objects. INCORRECT LIFTING TECHNIQUES DO NOT:   Lock your knees when lifting, even if it is a small object.  Bend and twist. Pivot at your feet or move your feet when needing to change directions.  Assume that you can safely pick up even a paperclip without proper posture.   Back/Neck: How to set up your workstation  Therapist Name:   Key Points:  Chair:  An adjustable chair to fit your height with a slight downward tilt of chair seat is helpful.  Should have good high-back support that assists with keeping the natural curves of your spine.  A "lumbar roll" or pillow at your low back can help you keep good posture.  Adjustable arm rests may decrease strain in upper body.   Keyboard:  Should be close and at elbow or just below elbow height.  Split keyboards can help decrease strain on wrist.  Wrist supports can provide mini-breaks to your wrists throughout the day.  Monitor/Screen:  Top of screen should be at eye level or slightly lower.  Good lighting is important to prevent eye strain, or headaches from glare.  Head Posture: A copy holder on either side of the monitor will help limit neck strain.  Head should not be forward.  Ears should line up with you shoulders.  Leg Posture:  Knee and hips should be bent half-way (about a 90 degree angle).  Shorter people may need something to prop their feet on, with knees bent, like a phone book.  Other Stuff: Keep the stuff (phones, pens, phonebooks, etc) that you use frequently close to you, so you're not straining to reach them.

## 2015-12-01 NOTE — Progress Notes (Signed)
Assessment and Plan:  1. Hypertension -Continue medication, monitor blood pressure at home. Continue DASH diet.  Reminder to go to the ER if any CP, SOB, nausea, dizziness, severe HA, changes vision/speech, left arm numbness and tingling and jaw pain.  2. Cholesterol -Continue diet and exercise. Check cholesterol.   3. Prediabetes  -Continue diet and exercise. Check A1C  4. Vitamin D Def - check level and continue medications.   5. Lower back pain- negative straight leg Prednisone was not prescribed,NSAIDs, RICE, and exercise given If not better follow up in office or will refer to PT/orthopedics.  6. Obesity with co morbidities- long discussion about weight loss, diet, and exercise, will start the patient on phentermine- hand out given and AE's discussed, will do close follow up.    Continue diet and meds as discussed. Further disposition pending results of labs. Over 30 minutes of exam, counseling, chart review, and critical decision making was performed  HPI 64 y.o. female  presents for 3 month follow up on hypertension, cholesterol, prediabetes, and vitamin D deficiency.   Her blood pressure has been controlled at home, today their BP is BP: 120/80 mmHg  She does workout but due to back pain she has not been doing as much  She denies chest pain, shortness of breath, dizziness. She has had lower back pain x 3 months, no injury, has been trying to stretch that has helped some, she states it flares with working out and at times wakes her up in the night. Has right sided back pain with some radiation into her right buttocks but no further, some numbness tingling in right foot but has had for several years, no weakness in that leg. She has been seeing pelvic floor PT and urology for incontinence that is improving.   She is not on cholesterol medication and denies myalgias. Her cholesterol is at goal. The cholesterol last visit was:   Lab Results  Component Value Date   CHOL 192  06/12/2015   HDL 65 06/12/2015   LDLCALC 77 06/12/2015   TRIG 252* 06/12/2015   CHOLHDL 3.0 06/12/2015    She has been working on diet and exercise for prediabetes, and denies paresthesia of the feet, polydipsia, polyuria and visual disturbances. Last A1C in the office was:  Lab Results  Component Value Date   HGBA1C 5.7* 06/12/2015   [name].   Lab Results  Component Value Date   VD25OH 40 06/12/2015     She is on thyroid medication. Her medication was not changed last visit.   Lab Results  Component Value Date   TSH 0.688 06/12/2015  .    Current Medications:  Current Outpatient Prescriptions on File Prior to Visit  Medication Sig Dispense Refill  . aspirin 81 MG chewable tablet Chew by mouth daily.    Marland Kitchen buPROPion (WELLBUTRIN XL) 150 MG 24 hr tablet Take 1 tablet (150 mg total) by mouth daily. 90 tablet 0  . Cholecalciferol (VITAMIN D PO) Take 1,000 Int'l Units by mouth daily.    Marland Kitchen estradiol (ESTRACE) 0.5 MG tablet Take 1 tablet (0.5 mg total) by mouth daily. 90 tablet 1  . estradiol (ESTRACE) 0.5 MG tablet TAKE 1 BY MOUTH DAILY 90 tablet 1  . ezetimibe-simvastatin (VYTORIN) 10-40 MG per tablet TAKE 1 BY MOUTH DAILY 90 tablet 1  . levothyroxine (SYNTHROID, LEVOTHROID) 100 MCG tablet TAKE 1 TABLET DAILY 90 tablet 1  . losartan (COZAAR) 100 MG tablet Take 1 tablet (100 mg  total) by mouth daily. 90 tablet 3  . losartan (COZAAR) 100 MG tablet TAKE 1 BY MOUTH DAILY 90 tablet 1  . Magnesium 500 MG TABS Take 1 tablet by mouth daily.    Marland Kitchen MAGNESIUM PO Take by mouth daily.    . mirabegron ER (MYRBETRIQ) 50 MG TB24 tablet Take 1 tablet (50 mg total) by mouth daily. 30 tablet 0  . Multiple Vitamins-Minerals (MULTIVITAMIN PO) Take by mouth daily.    Marland Kitchen omeprazole (PRILOSEC) 20 MG capsule TAKE 1 BY MOUTH DAILY 90 capsule 0  . OVER THE COUNTER MEDICATION Tumeric daily    . ranitidine (ZANTAC) 300 MG tablet Take twice a day while trying to get off PPI, then can  go to once at night 90 tablet 3   No current facility-administered medications on file prior to visit.   Medical History:  Past Medical History  Diagnosis Date  . Colon polyp, hyperplastic     2005  . Unspecified essential hypertension   . Hyperlipidemia   . Anemia   . Thyroid disease   . Prediabetes   . GERD (gastroesophageal reflux disease)   . Depression   . Vitamin D deficiency   . Anxiety   . Cancer (HCC)     basal cell CA- nose   Allergies:  Allergies  Allergen Reactions  . Lotensin [Benazepril Hcl]     Not effective for pt  . Naldecon Senior [Guaifenesin]     Unsure of reaction  . Prednisone     Unsure of reaction     Review of Systems:  Review of Systems  Constitutional: Negative.   HENT: Negative.   Eyes: Negative.   Respiratory: Negative.   Cardiovascular: Negative.   Gastrointestinal: Positive for heartburn (better with prilosec). Negative for nausea, vomiting, abdominal pain, diarrhea, constipation, blood in stool and melena.  Genitourinary: Negative.        Stress incontinence better with PT  Musculoskeletal: Positive for back pain and joint pain (knees, bursitis in left knee occ). Negative for myalgias, falls and neck pain.  Skin: Negative.   Neurological: Negative.   Endo/Heme/Allergies: Negative.   Psychiatric/Behavioral: Negative.     Family history- Review and unchanged Social history- Review and unchanged Physical Exam: BP 120/80 mmHg  Pulse 81  Temp(Src) 97.7 F (36.5 C) (Temporal)  Resp 16  Ht 5' 9"  (1.753 m)  Wt 191 lb (86.637 kg)  BMI 28.19 kg/m2  SpO2 95% Wt Readings from Last 3 Encounters:  12/01/15 191 lb (86.637 kg)  06/12/15 187 lb (84.823 kg)  02/23/15 193 lb (87.544 kg)   General Appearance: Well nourished, in no apparent distress. Eyes: PERRLA, EOMs, conjunctiva no swelling or erythema Sinuses: No Frontal/maxillary tenderness ENT/Mouth: Ext aud canals clear, TMs without erythema, bulging. No erythema, swelling, or  exudate on post pharynx.  Tonsils not swollen or erythematous. Hearing normal.  Neck: Supple, thyroid normal.  Respiratory: Respiratory effort normal, BS equal bilaterally without rales, rhonchi, wheezing or stridor.  Cardio: RRR with no MRGs. Brisk peripheral pulses without edema.  Abdomen: Soft, + BS,  Non tender, no guarding, rebound, hernias, masses. Lymphatics: Non tender without lymphadenopathy.  Musculoskeletal: Full ROM, 5/5 strength, Patient is able to ambulate well. Gait is  Antalgic. Straight leg raising with dorsiflexion negative bilaterally for radicular symptoms. Sensory exam in the legs are normal. Knee reflexes are normal Ankle reflexes are normal Strength is normal and symmetric in arms and legs. There is not SI tenderness to palpation.  There isparaspinal muscle spasm.  There is not midline tenderness.  ROM of spine with  limited in all spheres due to pain.  Skin: Warm, dry without rashes, lesions, ecchymosis.  Neuro: Cranial nerves intact. Normal muscle tone, no cerebellar symptoms. Psych: Awake and oriented X 3, normal affect, Insight and Judgment appropriate.    Vicie Mutters, PA-C 10:34 AM [facility]

## 2015-12-03 ENCOUNTER — Encounter: Payer: Self-pay | Admitting: Internal Medicine

## 2015-12-14 ENCOUNTER — Encounter: Payer: Self-pay | Admitting: Physician Assistant

## 2015-12-14 ENCOUNTER — Other Ambulatory Visit: Payer: Self-pay | Admitting: Physician Assistant

## 2015-12-14 MED ORDER — SIMVASTATIN 40 MG PO TABS: 40.0000 mg | ORAL_TABLET | Freq: Every evening | ORAL | Status: DC

## 2015-12-14 MED ORDER — EZETIMIBE 10 MG PO TABS: 10.0000 mg | ORAL_TABLET | Freq: Every day | ORAL | Status: DC

## 2015-12-25 ENCOUNTER — Encounter: Payer: Self-pay | Admitting: *Deleted

## 2016-01-22 ENCOUNTER — Other Ambulatory Visit: Payer: Self-pay | Admitting: Physician Assistant

## 2016-02-23 ENCOUNTER — Other Ambulatory Visit: Payer: Self-pay | Admitting: *Deleted

## 2016-02-23 MED ORDER — ESTRADIOL 0.5 MG PO TABS: 0.5000 mg | ORAL_TABLET | Freq: Every day | ORAL | Status: DC

## 2016-02-26 ENCOUNTER — Other Ambulatory Visit: Payer: Self-pay | Admitting: *Deleted

## 2016-02-26 MED ORDER — ESTRADIOL 0.5 MG PO TABS: 0.5000 mg | ORAL_TABLET | Freq: Every day | ORAL | Status: DC

## 2016-06-17 ENCOUNTER — Encounter: Payer: Self-pay | Admitting: Physician Assistant

## 2016-06-17 ENCOUNTER — Ambulatory Visit (INDEPENDENT_AMBULATORY_CARE_PROVIDER_SITE_OTHER): Payer: BLUE CROSS/BLUE SHIELD | Admitting: Physician Assistant

## 2016-06-17 VITALS — BP 130/76 | HR 79 | Temp 97.5°F | Resp 16 | Ht 69.0 in | Wt 191.6 lb

## 2016-06-17 DIAGNOSIS — Z0001 Encounter for general adult medical examination with abnormal findings: Secondary | ICD-10-CM | POA: Diagnosis not present

## 2016-06-17 DIAGNOSIS — R6889 Other general symptoms and signs: Secondary | ICD-10-CM

## 2016-06-17 DIAGNOSIS — Z136 Encounter for screening for cardiovascular disorders: Secondary | ICD-10-CM

## 2016-06-17 DIAGNOSIS — E079 Disorder of thyroid, unspecified: Secondary | ICD-10-CM

## 2016-06-17 DIAGNOSIS — R7303 Prediabetes: Secondary | ICD-10-CM

## 2016-06-17 DIAGNOSIS — Z85828 Personal history of other malignant neoplasm of skin: Secondary | ICD-10-CM

## 2016-06-17 DIAGNOSIS — K7581 Nonalcoholic steatohepatitis (NASH): Secondary | ICD-10-CM | POA: Diagnosis not present

## 2016-06-17 DIAGNOSIS — D649 Anemia, unspecified: Secondary | ICD-10-CM

## 2016-06-17 DIAGNOSIS — E785 Hyperlipidemia, unspecified: Secondary | ICD-10-CM

## 2016-06-17 DIAGNOSIS — K219 Gastro-esophageal reflux disease without esophagitis: Secondary | ICD-10-CM

## 2016-06-17 DIAGNOSIS — Z79899 Other long term (current) drug therapy: Secondary | ICD-10-CM

## 2016-06-17 DIAGNOSIS — N393 Stress incontinence (female) (male): Secondary | ICD-10-CM

## 2016-06-17 DIAGNOSIS — I10 Essential (primary) hypertension: Secondary | ICD-10-CM

## 2016-06-17 DIAGNOSIS — F325 Major depressive disorder, single episode, in full remission: Secondary | ICD-10-CM

## 2016-06-17 DIAGNOSIS — E559 Vitamin D deficiency, unspecified: Secondary | ICD-10-CM | POA: Diagnosis not present

## 2016-06-17 LAB — IRON AND TIBC
%SAT: 20 % (ref 11–50)
Iron: 83 ug/dL (ref 45–160)
TIBC: 412 ug/dL (ref 250–450)
UIBC: 329 ug/dL (ref 125–400)

## 2016-06-17 LAB — HEPATIC FUNCTION PANEL
ALBUMIN: 4.4 g/dL (ref 3.6–5.1)
ALT: 14 U/L (ref 6–29)
AST: 13 U/L (ref 10–35)
Alkaline Phosphatase: 54 U/L (ref 33–130)
BILIRUBIN DIRECT: 0.1 mg/dL (ref <=0.2)
BILIRUBIN TOTAL: 0.4 mg/dL (ref 0.2–1.2)
Indirect Bilirubin: 0.3 mg/dL (ref 0.2–1.2)
Total Protein: 6.7 g/dL (ref 6.1–8.1)

## 2016-06-17 LAB — CBC WITH DIFFERENTIAL/PLATELET
BASOS ABS: 74 {cells}/uL (ref 0–200)
BASOS PCT: 1 %
EOS ABS: 148 {cells}/uL (ref 15–500)
EOS PCT: 2 %
HCT: 40.2 % (ref 35.0–45.0)
Hemoglobin: 13.4 g/dL (ref 11.7–15.5)
LYMPHS PCT: 25 %
Lymphs Abs: 1850 cells/uL (ref 850–3900)
MCH: 29.5 pg (ref 27.0–33.0)
MCHC: 33.3 g/dL (ref 32.0–36.0)
MCV: 88.5 fL (ref 80.0–100.0)
MONOS PCT: 10 %
MPV: 9 fL (ref 7.5–12.5)
Monocytes Absolute: 740 cells/uL (ref 200–950)
NEUTROS ABS: 4588 {cells}/uL (ref 1500–7800)
Neutrophils Relative %: 62 %
PLATELETS: 275 10*3/uL (ref 140–400)
RBC: 4.54 MIL/uL (ref 3.80–5.10)
RDW: 13.6 % (ref 11.0–15.0)
WBC: 7.4 10*3/uL (ref 3.8–10.8)

## 2016-06-17 LAB — BASIC METABOLIC PANEL WITH GFR
BUN: 15 mg/dL (ref 7–25)
CO2: 27 mmol/L (ref 20–31)
CREATININE: 0.93 mg/dL (ref 0.50–0.99)
Calcium: 9.4 mg/dL (ref 8.6–10.4)
Chloride: 102 mmol/L (ref 98–110)
GFR, EST AFRICAN AMERICAN: 76 mL/min (ref >=60)
GFR, Est Non African American: 66 mL/min (ref >=60)
Glucose, Bld: 82 mg/dL (ref 65–99)
Potassium: 4.4 mmol/L (ref 3.5–5.3)
SODIUM: 140 mmol/L (ref 135–146)

## 2016-06-17 LAB — LIPID PANEL
Cholesterol: 217 mg/dL — ABNORMAL HIGH (ref 125–200)
HDL: 90 mg/dL (ref >=46)
LDL CALC: 96 mg/dL (ref <130)
TRIGLYCERIDES: 155 mg/dL — AB (ref <150)
Total CHOL/HDL Ratio: 2.4 Ratio (ref <=5.0)
VLDL: 31 mg/dL — ABNORMAL HIGH (ref <30)

## 2016-06-17 LAB — FERRITIN: Ferritin: 35 ng/mL (ref 20–288)

## 2016-06-17 LAB — HEMOGLOBIN A1C
Hgb A1c MFr Bld: 5.5 % (ref <5.7)
Mean Plasma Glucose: 111 mg/dL

## 2016-06-17 LAB — MAGNESIUM: MAGNESIUM: 1.8 mg/dL (ref 1.5–2.5)

## 2016-06-17 LAB — TSH: TSH: 0.77 m[IU]/L

## 2016-06-17 NOTE — Progress Notes (Signed)
Complete Physical  Assessment and Plan: 1. Stress incontinence Better after PT.   2. Essential hypertension - continue medications, monitor at home if greater than 130/80 will add on low dose BB, DASH diet, exercise and monitor at home.  - losartan (COZAAR) 100 MG tablet; Take 1 tablet (100 mg total) by mouth daily.  Dispense: 90 tablet; Refill: 3 - CBC with Differential/Platelet - Hepatic function panel - BASIC METABOLIC PANEL WITH GFR - Urinalysis, Routine w reflex microscopic (not at [facility]) - Microalbumin / creatinine urine ratio - EKG 12-Lead  3. Prediabetes Discussed general issues about diabetes pathophysiology and management., Educational material distributed., Suggested low cholesterol diet., Encouraged aerobic exercise., Discussed foot care., Reminded to get yearly retinal exam. - Hemoglobin A1c - Insulin, fasting  4. Thyroid disease Hypothyroidism-check TSH level, continue medications the same, reminded to take on an empty stomach 30-8mns before food.  - TSH  5. Hyperlipidemia -continue medications, check lipids, decrease fatty foods, increase activity.  - Lipid panel  6. Vitamin D deficiency - Vit D  25 hydroxy (rtn osteoporosis monitoring)  7. Medication management - Magnesium  8. Depression, remission Depression- will try to stop wellbutrin due to some anxiety, does not want to try a medication at this time.  stress management techniques discussed, increase water, good sleep hygiene discussed, increase exercise, and increase veggies.   9. Anemia, unspecified anemia type - Iron and TIBC - Ferritin  10. Gastroesophageal reflux disease without esophagitis - continue zantac  11. NASH (nonalcoholic steatohepatitis) Check labs, avoid tylenol, alcohol, weight loss advised.   12. History of basal cell cancer Follow up Dr. TDenna Haggard Discussed med's effects and SE's. Screening labs and tests as requested with regular follow-up as recommended.  HPI 65y.o.  female  presents for a complete physical. Her blood pressure is unknown if it is controlled at home, does not check it, today their BP is   She does workout. She denies chest pain, shortness of breath, dizziness.  She is on cholesterol medication, vytorin 10/40 and denies myalgias. Her cholesterol is at goal. The cholesterol last visit was:   Lab Results  Component Value Date   CHOL 192 06/12/2015   HDL 65 06/12/2015   LDLCALC 77 06/12/2015   TRIG 252* 06/12/2015   CHOLHDL 3.0 06/12/2015   She is prediabetic, denies Dm polys. Last A1C in the office was:  Lab Results  Component Value Date   HGBA1C 5.7* 06/12/2015   [name], on 5000 IU daily. Lab Results  Component Value Date   VD25OH 40 06/12/2015     She is on thyroid medication. Her medication was not changed last visit. Patient denies nervousness, palpitations and weight changes.  Lab Results  Component Value Date   TSH 0.688 06/12/2015   She has history of stress incontinence, did pelvic floor PT and is improving.  She is on wellbutrin for depression but states she would like to get off of it due to anxiety.   She is born between [birth year][place]  Lab Results  Component Value Date   HCVAB NEGATIVE 06/12/2015   BMI is Body mass index is 28.28 kg/(m^2)., she is working on diet and exercise. Wt Readings from Last 3 Encounters:  06/17/16 191 lb 9.6 oz (86.909 kg)  12/01/15 191 lb (86.637 kg)  06/12/15 187 lb (84.823 kg)    Current Medications:  Current Outpatient Prescriptions on File Prior to Visit  Medication Sig Dispense Refill  . aspirin 81 MG  chewable tablet Chew by mouth daily.    Marland Kitchen buPROPion (WELLBUTRIN XL) 150 MG 24 hr tablet TAKE 1 BY MOUTH DAILY 90 tablet 0  . Cholecalciferol (VITAMIN D PO) Take 1,000 Int'l Units by mouth daily.    Marland Kitchen estradiol (ESTRACE) 0.5 MG tablet Take 1 tablet (0.5 mg total) by mouth daily. 90 tablet 1  . ezetimibe (ZETIA) 10 MG tablet Take 1 tablet (10 mg total)  by mouth daily. 90 tablet 1  . levothyroxine (SYNTHROID, LEVOTHROID) 100 MCG tablet TAKE 1 TABLET DAILY 90 tablet 1  . losartan (COZAAR) 100 MG tablet Take 1 tablet (100 mg total) by mouth daily. 90 tablet 3  . Magnesium 500 MG TABS Take 1 tablet by mouth daily.    . Multiple Vitamins-Minerals (MULTIVITAMIN PO) Take by mouth daily.    Marland Kitchen OVER THE COUNTER MEDICATION Tumeric daily    . ranitidine (ZANTAC) 300 MG tablet TAKE BY MOUTH TWICE DAILY AS DIRECTED WHILE TRYING TO GET OFF PPI THEN CAN GO TO ONCE AT NIGHT 90 tablet 0  . simvastatin (ZOCOR) 40 MG tablet Take 1 tablet (40 mg total) by mouth every evening. 90 tablet 1   No current facility-administered medications on file prior to visit.   Health Maintenance:   Immunization History  Administered Date(s) Administered  . Influenza-Unspecified 09/30/2015  . Pneumococcal Polysaccharide-23 03/25/2006  . Td 03/15/2004  . Tdap 06/02/2014   Tetanus: 2015 Pneumovax: N/A Prevnar 13: due age 79 Flu vaccine: 2016 at work for free  Zostavax: will call insurance Pap: 07/2014 normal, due next year MGM:12/2014 3D MGM, due 2018 DEXA: 2007 Colonoscopy: 04/2014 due 10 years EGD: N/A CT AB 2003 AB [place] 2006 CXR 04/2012  Patient Care Team: Unk Pinto, MD as PCP - General (Internal Medicine) Melissa Noon, [place] as Referring Physician (Optometry) Lavonna Monarch, MD as Consulting Physician (Dermatology) Inda Castle, MD as Consulting Physician (Gastroenterology) Bobbye Charleston, MD as Consulting Physician (Obstetrics and Gynecology) Elsie Saas, MD as Consulting Physician (Orthopedic Surgery)   Allergies:  Allergies  Allergen Reactions  . Naldecon Senior [Guaifenesin]     Unsure of reaction  . Prednisone     Unsure of reaction  . Lotensin [Benazepril Hcl] Other (See Comments)    Not effective for pt Not effective for pt   Medical History:  Past Medical History  Diagnosis Date  . Colon polyp, hyperplastic     2005  . Unspecified  essential hypertension   . Hyperlipidemia   . Anemia   . Thyroid disease   . Prediabetes   . GERD (gastroesophageal reflux disease)   . Depression   . Vitamin D deficiency   . Anxiety   . Cancer ([place])     basal cell CA- nose   Surgical History:  Past Surgical History  Procedure Laterality Date  . Tonsillectomy    . Thyroidectomy    . Vaginal hysterectomy    . Breast lumpectomy    . Colonoscopy    . Mohs surgery    . Knee arthroscopy      knee "cleanup" of right knee cartiledge   Family History:  Family History  Problem Relation Age of Onset  . Heart disease Mother   . Hypertension Mother   . Hypertension Father   . Alzheimer's disease Father   . Colon cancer Neg Hx   . Esophageal cancer Neg Hx   . Rectal cancer Neg Hx   . Stomach cancer Neg Hx    Social History:  Social History  Substance Use Topics  . Smoking status: Former Smoker    Quit date: 02/23/2005  . Smokeless tobacco: Never Used  . Alcohol Use: 0.0 oz/week    0 Standard drinks or equivalent per week     Comment: on Fridays- wine   Review of Systems  Constitutional: Negative.   HENT: Negative.   Eyes: Negative.   Respiratory: Negative.   Cardiovascular: Negative.   Gastrointestinal: Positive for heartburn (on zantac, still takes occ tums). Negative for nausea, vomiting, abdominal pain, diarrhea, constipation, blood in stool and melena.  Genitourinary: Negative.   Musculoskeletal: Positive for joint pain (knees, bursitis in left knee occ). Negative for myalgias, back pain, falls and neck pain.  Skin: Negative.   Neurological: Negative.   Endo/Heme/Allergies: Negative.   Psychiatric/Behavioral: Negative.     Physical Exam: Estimated body mass index is 28.28 kg/(m^2) as calculated from the following:   Height as of 12/01/15: 5' 9"  (1.753 m).   Weight as of this encounter: 191 lb 9.6 oz (86.909 kg). Pulse 79  Temp(Src) 97.5 F (36.4 C) (Temporal)  Resp 16  Wt 191 lb 9.6 oz (86.909 kg)  SpO2  97% General Appearance: Well nourished, in no apparent distress. Eyes: PERRLA, EOMs, conjunctiva no swelling or erythema, normal fundi and vessels. Sinuses: No Frontal/maxillary tenderness ENT/Mouth: Ext aud canals clear, normal light reflex with TMs without erythema, bulging.  Good dentition. No erythema, swelling, or exudate on post pharynx. Tonsils not swollen or erythematous. Hearing normal.  Neck: Supple, thyroid normal. No bruits Respiratory: Respiratory effort normal, BS equal bilaterally without rales, rhonchi, wheezing or stridor. Cardio: RRR without murmurs, rubs or gallops. Brisk peripheral pulses without edema.  Chest: symmetric, with normal excursions and percussion. Breasts: defer Abdomen: Soft, +BS, non tender, negative murphy's, no guarding, rebound, hernias, masses, or organomegaly. .  Lymphatics: Non tender without lymphadenopathy.  Genitourinary: defer Musculoskeletal: Full ROM all peripheral extremities,5/5 strength, and normal gait. Skin: Warm, dry without rashes, lesions, ecchymosis.  Neuro: Cranial nerves intact, reflexes equal bilaterally. Normal muscle tone, no cerebellar symptoms. Sensation intact.  Psych: Awake and oriented X 3, normal affect, Insight and Judgment appropriate.   EKG: WNL AORTA SCAN: defer   Vicie Mutters 1:57 PM

## 2016-06-17 NOTE — Patient Instructions (Signed)
Call about shingles vaccine for your insurance Follow up Dr. Denna Haggard  Fatty Liver Fatty liver is the accumulation of fat in liver cells. It is also called hepatosteatosis or steatohepatitis. It is normal for your liver to contain some fat. If fat is more than 5 to 10% of your liver's weight, you have fatty liver.  There are often no symptoms (problems) for years while damage is still occurring. People often learn about their fatty liver when they have medical tests for other reasons. Fat can damage your liver for years or even decades without causing problems. When it becomes severe, it can cause fatigue, weight loss, weakness, and confusion. This makes you more likely to develop more serious liver problems. The liver is the largest organ in the body. It does a lot of work and often gives no warning signs when it is sick until late in a disease. The liver has many important jobs including:  Breaking down foods.  Storing vitamins, iron, and other minerals.  Making proteins.  Making bile for food digestion.  Breaking down many products including medications, alcohol and some poisons.  PROGNOSIS  Fatty liver may cause no damage or it can lead to an inflammation of the liver. This is, called steatohepatitis.  Over time the liver may become scarred and hardened. This condition is called cirrhosis. Cirrhosis is serious and may lead to liver failure or cancer. NASH is one of the leading causes of cirrhosis. About 10-20% of Americans have fatty liver and a smaller 2-5% has NASH.  TREATMENT   Weight loss, fat restriction, and exercise in overweight patients produces inconsistent results but is worth trying.  Good control of diabetes may reduce fatty liver.  Eat a balanced, healthy diet.  Increase your physical activity.  There are no medical or surgical treatments for a fatty liver or NASH, but improving your diet and increasing your exercise may help prevent or reverse some of the  damage.

## 2016-06-18 LAB — VITAMIN D 25 HYDROXY (VIT D DEFICIENCY, FRACTURES): VIT D 25 HYDROXY: 35 ng/mL (ref 30–100)

## 2016-06-18 LAB — URINALYSIS, ROUTINE W REFLEX MICROSCOPIC

## 2016-06-18 LAB — MICROALBUMIN / CREATININE URINE RATIO

## 2016-06-18 NOTE — Addendum Note (Signed)
Addended by: Vicie Mutters R on: 06/18/2016 07:44 AM   Modules accepted: Miquel Dunn

## 2016-09-03 ENCOUNTER — Other Ambulatory Visit: Payer: Self-pay | Admitting: Internal Medicine

## 2016-09-03 ENCOUNTER — Other Ambulatory Visit: Payer: Self-pay | Admitting: Physician Assistant

## 2016-09-05 ENCOUNTER — Other Ambulatory Visit: Payer: Self-pay | Admitting: *Deleted

## 2016-09-05 MED ORDER — BUPROPION HCL ER (XL) 150 MG PO TB24: ORAL_TABLET | ORAL | 0 refills | Status: DC

## 2016-09-05 MED ORDER — RANITIDINE HCL 300 MG PO TABS: ORAL_TABLET | ORAL | 0 refills | Status: DC

## 2016-09-10 ENCOUNTER — Ambulatory Visit (INDEPENDENT_AMBULATORY_CARE_PROVIDER_SITE_OTHER): Payer: BLUE CROSS/BLUE SHIELD | Admitting: Internal Medicine

## 2016-09-10 ENCOUNTER — Encounter: Payer: Self-pay | Admitting: Internal Medicine

## 2016-09-10 VITALS — BP 140/72 | HR 84 | Temp 98.0°F | Resp 16 | Ht 69.0 in

## 2016-09-10 DIAGNOSIS — J309 Allergic rhinitis, unspecified: Secondary | ICD-10-CM

## 2016-09-10 MED ORDER — FLUTICASONE PROPIONATE 50 MCG/ACT NA SUSP: 2.0000 | Freq: Every day | NASAL | 0 refills | Status: DC

## 2016-09-10 MED ORDER — PROMETHAZINE-DM 6.25-15 MG/5ML PO SYRP: ORAL_SOLUTION | ORAL | 1 refills | Status: DC

## 2016-09-10 NOTE — Patient Instructions (Signed)
Please start using the cough syrup up to 3 times a day as needed to help quiet coughing.  You may want to try elevating the head of your bed or using more pillows to help at nighttime with some of the drainage.  Please take zyrtec once per night to help dry up any congestion.  Please use flonase 2 sprays per nostril right before you go to bed.  Look down at your toes while you spray to avoid it draining down your throat.  Please use saline spray, netti pot or let shower water clean out your nose.    You can take benadryl at nighttime as well to help with congestion and to help you sleep.

## 2016-09-10 NOTE — Progress Notes (Signed)
HPI  Patient presents to the office for evaluation of cough.  It has been going on for 2 weeks.  Patient reports night > day, dry, barky, worse with lying down.  They also endorse postnasal drip and some soreness of the glands in her neck. No congestion or runny nose.  No eye itching or drainage.  No acid reflux or heartburn recently.  They have tried robitussin at bedtime and nyquil.  They report that nothing has worked.  They denies other sick contacts.  Review of Systems  Constitutional: Positive for malaise/fatigue. Negative for chills and fever.  HENT: Positive for congestion and sore throat. Negative for ear pain and hearing loss.   Respiratory: Positive for cough. Negative for sputum production, shortness of breath and wheezing.   Cardiovascular: Negative for chest pain, palpitations and leg swelling.  Neurological: Negative for headaches.    PE:  Vitals:   09/10/16 1342  BP: 140/72  Pulse: 84  Resp: 16  Temp: 98 F (36.7 C)   General:  Alert and non-toxic, WDWN, NAD HEENT: NCAT, PERLA, EOM normal, no occular discharge or erythema.  Nasal mucosal edema with sinus tenderness to palpation.  Oropharynx clear with minimal oropharyngeal edema and erythema.  Mucous membranes moist and pink. Neck:  Cervical adenopathy Chest:  RRR no MRGs.  Lungs clear to auscultation A&P with no wheezes rhonchi or rales.   Abdomen: +BS x 4 quadrants, soft, non-tender, no guarding, rigidity, or rebound. Skin: warm and dry no rash Neuro: A&Ox4, CN II-XII grossly intact  Assessment and Plan:   1. Allergic rhinitis, unspecified allergic rhinitis type -zyrtec -nasal saline -avoid prednisone use due to unknown intolerance. - promethazine-dextromethorphan (PROMETHAZINE-DM) 6.25-15 MG/5ML syrup; Take 5-10 ML PO q8hrs prn for cough  Dispense: 360 mL; Refill: 1 - fluticasone (FLONASE) 50 MCG/ACT nasal spray; Place 2 sprays into both nostrils daily.  Dispense: 16 g; Refill: 0

## 2016-09-11 ENCOUNTER — Other Ambulatory Visit: Payer: Self-pay | Admitting: *Deleted

## 2016-09-11 ENCOUNTER — Telehealth: Payer: Self-pay | Admitting: *Deleted

## 2016-09-11 NOTE — Telephone Encounter (Signed)
[facility] pharmacy requesting alternative Rx to Promethazine DM.Marland Kitchen Promethazine not available.  Pt states she will try OTC Robitussin to help with cough symptoms.  Advised her to call back if no relief from symptoms.

## 2016-09-12 ENCOUNTER — Other Ambulatory Visit: Payer: Self-pay | Admitting: Internal Medicine

## 2016-09-16 ENCOUNTER — Other Ambulatory Visit: Payer: Self-pay | Admitting: Physician Assistant

## 2016-09-16 MED ORDER — RANITIDINE HCL 300 MG PO TABS
ORAL_TABLET | ORAL | 3 refills | Status: DC
Start: 2016-09-16 — End: 2017-10-04

## 2016-11-07 ENCOUNTER — Telehealth: Payer: Self-pay

## 2016-11-07 ENCOUNTER — Other Ambulatory Visit: Payer: Self-pay | Admitting: *Deleted

## 2016-11-07 MED ORDER — BUPROPION HCL ER (XL) 150 MG PO TB24: ORAL_TABLET | ORAL | 0 refills | Status: DC

## 2016-11-07 MED ORDER — SIMVASTATIN 40 MG PO TABS: 40.0000 mg | ORAL_TABLET | Freq: Every evening | ORAL | 1 refills | Status: DC

## 2016-11-07 NOTE — Telephone Encounter (Signed)
Spoke with pt about her message about ranitidine & the directions about how to take them. Pt informed me that she had no question she just needed a refill on simvastatin.  I made sure that this was all she needed & went over the direction just in case there was questions & informed pt that I would refill her simvastatin.

## 2016-11-10 ENCOUNTER — Other Ambulatory Visit: Payer: Self-pay | Admitting: Internal Medicine

## 2016-11-10 ENCOUNTER — Encounter: Payer: Self-pay | Admitting: Physician Assistant

## 2016-11-10 ENCOUNTER — Other Ambulatory Visit: Payer: Self-pay | Admitting: Physician Assistant

## 2016-11-10 MED ORDER — LEVOTHYROXINE SODIUM 100 MCG PO TABS: ORAL_TABLET | ORAL | 0 refills | Status: DC

## 2016-11-10 MED ORDER — LEVOTHYROXINE SODIUM 100 MCG PO TABS: ORAL_TABLET | ORAL | 1 refills | Status: DC

## 2016-11-10 MED ORDER — BUPROPION HCL ER (XL) 150 MG PO TB24: ORAL_TABLET | ORAL | 1 refills | Status: DC

## 2016-11-11 ENCOUNTER — Other Ambulatory Visit: Payer: Self-pay | Admitting: *Deleted

## 2016-11-11 ENCOUNTER — Other Ambulatory Visit: Payer: Self-pay | Admitting: Dermatology

## 2016-11-11 DIAGNOSIS — D492 Neoplasm of unspecified behavior of bone, soft tissue, and skin: Secondary | ICD-10-CM | POA: Diagnosis not present

## 2016-11-11 DIAGNOSIS — D239 Other benign neoplasm of skin, unspecified: Secondary | ICD-10-CM | POA: Diagnosis not present

## 2016-11-11 DIAGNOSIS — L739 Follicular disorder, unspecified: Secondary | ICD-10-CM | POA: Diagnosis not present

## 2016-11-11 MED ORDER — BUPROPION HCL ER (XL) 150 MG PO TB24: ORAL_TABLET | ORAL | 1 refills | Status: DC

## 2016-11-13 ENCOUNTER — Other Ambulatory Visit: Payer: Self-pay

## 2016-11-13 MED ORDER — EZETIMIBE 10 MG PO TABS: ORAL_TABLET | ORAL | 0 refills | Status: DC

## 2016-11-13 MED ORDER — ESTRADIOL 0.5 MG PO TABS: 0.5000 mg | ORAL_TABLET | Freq: Every day | ORAL | 1 refills | Status: DC

## 2016-12-12 ENCOUNTER — Other Ambulatory Visit: Payer: Self-pay

## 2016-12-12 DIAGNOSIS — I10 Essential (primary) hypertension: Secondary | ICD-10-CM

## 2016-12-12 MED ORDER — LOSARTAN POTASSIUM 100 MG PO TABS: 100.0000 mg | ORAL_TABLET | Freq: Every day | ORAL | 3 refills | Status: DC

## 2016-12-17 ENCOUNTER — Encounter: Payer: Self-pay | Admitting: Physician Assistant

## 2016-12-17 ENCOUNTER — Ambulatory Visit (INDEPENDENT_AMBULATORY_CARE_PROVIDER_SITE_OTHER): Payer: BLUE CROSS/BLUE SHIELD | Admitting: Physician Assistant

## 2016-12-17 ENCOUNTER — Other Ambulatory Visit: Payer: Self-pay

## 2016-12-17 VITALS — BP 122/76 | HR 83 | Temp 97.3°F | Resp 16 | Ht 69.0 in | Wt 199.8 lb

## 2016-12-17 DIAGNOSIS — I10 Essential (primary) hypertension: Secondary | ICD-10-CM

## 2016-12-17 DIAGNOSIS — R7303 Prediabetes: Secondary | ICD-10-CM

## 2016-12-17 DIAGNOSIS — E559 Vitamin D deficiency, unspecified: Secondary | ICD-10-CM

## 2016-12-17 DIAGNOSIS — Z79899 Other long term (current) drug therapy: Secondary | ICD-10-CM

## 2016-12-17 DIAGNOSIS — E785 Hyperlipidemia, unspecified: Secondary | ICD-10-CM | POA: Diagnosis not present

## 2016-12-17 DIAGNOSIS — E079 Disorder of thyroid, unspecified: Secondary | ICD-10-CM | POA: Diagnosis not present

## 2016-12-17 LAB — CBC WITH DIFFERENTIAL/PLATELET
BASOS PCT: 1 %
Basophils Absolute: 75 cells/uL (ref 0–200)
EOS ABS: 150 {cells}/uL (ref 15–500)
EOS PCT: 2 %
HCT: 39.8 % (ref 35.0–45.0)
Hemoglobin: 13 g/dL (ref 11.7–15.5)
Lymphocytes Relative: 24 %
Lymphs Abs: 1800 cells/uL (ref 850–3900)
MCH: 30 pg (ref 27.0–33.0)
MCHC: 32.7 g/dL (ref 32.0–36.0)
MCV: 91.7 fL (ref 80.0–100.0)
MONOS PCT: 10 %
MPV: 8.8 fL (ref 7.5–12.5)
Monocytes Absolute: 750 cells/uL (ref 200–950)
NEUTROS ABS: 4725 {cells}/uL (ref 1500–7800)
Neutrophils Relative %: 63 %
PLATELETS: 253 10*3/uL (ref 140–400)
RBC: 4.34 MIL/uL (ref 3.80–5.10)
RDW: 13.2 % (ref 11.0–15.0)
WBC: 7.5 10*3/uL (ref 3.8–10.8)

## 2016-12-17 LAB — TSH: TSH: 1.45 m[IU]/L

## 2016-12-17 MED ORDER — LOSARTAN POTASSIUM 100 MG PO TABS: 100.0000 mg | ORAL_TABLET | Freq: Every day | ORAL | 3 refills | Status: DC

## 2016-12-17 MED ORDER — PHENTERMINE HCL 37.5 MG PO TABS: 37.5000 mg | ORAL_TABLET | Freq: Every day | ORAL | 2 refills | Status: DC

## 2016-12-17 NOTE — Progress Notes (Signed)
Assessment and Plan:   Hypertension -Continue medication, monitor blood pressure at home. Continue DASH diet.  Reminder to go to the ER if any CP, SOB, nausea, dizziness, severe HA, changes vision/speech, left arm numbness and tingling and jaw pain.  Cholesterol -Continue diet and exercise. Check cholesterol.   Prediabetes  -Continue diet and exercise. Check A1C  Vitamin D Def - check level and continue medications.  Obesity with co morbidities- long discussion about weight loss, diet, and exercise, will start the patient on phentermine- hand out given and AE's discussed, will do close follow up.    Continue diet and meds as discussed. Further disposition pending results of labs. Over 30 minutes of exam, counseling, chart review, and critical decision making was performed  HPI 65 y.o. female  presents for 3 month follow up on hypertension, cholesterol, prediabetes, and vitamin D deficiency.   Her blood pressure has been controlled at home, today their BP is BP: 122/76  She does workout, back is doing better.   She denies chest pain, shortness of breath, dizziness.   She is not on cholesterol medication and denies myalgias. Her cholesterol is at goal. The cholesterol last visit was:   Lab Results  Component Value Date   CHOL 217 (H) 06/17/2016   HDL 90 06/17/2016   LDLCALC 96 06/17/2016   TRIG 155 (H) 06/17/2016   CHOLHDL 2.4 06/17/2016    She has been working on diet and exercise for prediabetes, and denies paresthesia of the feet, polydipsia, polyuria and visual disturbances. Last A1C in the office was:  Lab Results  Component Value Date   HGBA1C 5.5 06/17/2016   [name].   Lab Results  Component Value Date   VD25OH 35 06/17/2016     She is on thyroid medication. Her medication was not changed last visit.   Lab Results  Component Value Date   TSH 0.77 06/17/2016  .  BMI is Body mass index is 29.51 kg/m., she is working on diet and  exercise. Wt Readings from Last 3 Encounters:  12/17/16 199 lb 12.8 oz (90.6 kg)  06/17/16 191 lb 9.6 oz (86.9 kg)  12/01/15 191 lb (86.6 kg)    Current Medications:  Current Outpatient Prescriptions on File Prior to Visit  Medication Sig Dispense Refill  . aspirin 81 MG chewable tablet Chew by mouth daily.    Marland Kitchen buPROPion (WELLBUTRIN XL) 150 MG 24 hr tablet TAKE 1 BY MOUTH DAILY 90 tablet 1  . Cholecalciferol (VITAMIN D PO) Take 1,000 Int'l Units by mouth daily.    Marland Kitchen estradiol (ESTRACE) 0.5 MG tablet Take 1 tablet (0.5 mg total) by mouth daily. 90 tablet 1  . ezetimibe (ZETIA) 10 MG tablet TAKE 1 BY MOUTH DAILY 90 tablet 0  . fluticasone (FLONASE) 50 MCG/ACT nasal spray Place 2 sprays into both nostrils daily. 16 g 0  . levothyroxine (SYNTHROID, LEVOTHROID) 100 MCG tablet TAKE 1 TABLET DAILY 90 tablet 1  . Magnesium 500 MG TABS Take 1 tablet by mouth daily.    . Multiple Vitamins-Minerals (MULTIVITAMIN PO) Take by mouth daily.    Marland Kitchen OVER THE COUNTER MEDICATION Tumeric daily    . ranitidine (ZANTAC) 300 MG tablet TAKE BY MOUTH TWICE DAILY AS DIRECTED WHILE TRYING TO GET OFF PPI THEN CAN GO TO ONCE AT NIGHT 90 tablet 3  . simvastatin (ZOCOR) 40 MG tablet Take 1 tablet (40 mg total) by mouth every evening. 90 tablet 1   No current facility-administered  medications on file prior to visit.    Medical History:  Past Medical History:  Diagnosis Date  . Anemia   . Anxiety   . Cancer (HCC)    basal cell CA- nose  . Colon polyp, hyperplastic    2005  . Depression   . GERD (gastroesophageal reflux disease)   . Hyperlipidemia   . Prediabetes   . Thyroid disease   . Unspecified essential hypertension   . Vitamin D deficiency    Allergies:  Allergies  Allergen Reactions  . Naldecon Senior [Guaifenesin]     Unsure of reaction  . Prednisone     Unsure of reaction  . Lotensin [Benazepril Hcl] Other (See Comments)    Not effective for pt Not effective for pt     Review of Systems:   Review of Systems  Constitutional: Negative.   HENT: Negative.   Eyes: Negative.   Respiratory: Negative.   Cardiovascular: Negative.   Gastrointestinal: Positive for heartburn (better with prilosec). Negative for abdominal pain, blood in stool, constipation, diarrhea, melena, nausea and vomiting.  Genitourinary: Negative.        Stress incontinence better with PT  Musculoskeletal: Positive for joint pain (knees, bursitis in left knee occ). Negative for back pain, falls, myalgias and neck pain.  Skin: Negative.   Neurological: Negative.   Endo/Heme/Allergies: Negative.   Psychiatric/Behavioral: Negative.     Family history- Review and unchanged Social history- Review and unchanged Physical Exam: BP 122/76   Pulse 83   Temp 97.3 F (36.3 C)   Resp 16   Ht 5' 9"  (1.753 m)   Wt 199 lb 12.8 oz (90.6 kg)   SpO2 98%   BMI 29.51 kg/m  Wt Readings from Last 3 Encounters:  12/17/16 199 lb 12.8 oz (90.6 kg)  06/17/16 191 lb 9.6 oz (86.9 kg)  12/01/15 191 lb (86.6 kg)   General Appearance: Well nourished, in no apparent distress. Eyes: PERRLA, EOMs, conjunctiva no swelling or erythema Sinuses: No Frontal/maxillary tenderness ENT/Mouth: Ext aud canals clear, TMs without erythema, bulging. No erythema, swelling, or exudate on post pharynx.  Tonsils not swollen or erythematous. Hearing normal.  Neck: Supple, thyroid normal.  Respiratory: Respiratory effort normal, BS equal bilaterally without rales, rhonchi, wheezing or stridor.  Cardio: RRR with no MRGs. Brisk peripheral pulses without edema.  Abdomen: Soft, + BS,  Non tender, no guarding, rebound, hernias, masses. Lymphatics: Non tender without lymphadenopathy.  Musculoskeletal: Full ROM, 5/5 strength, normal gait Skin: Warm, dry without rashes, lesions, ecchymosis.  Neuro: Cranial nerves intact. Normal muscle tone, no cerebellar symptoms. Psych: Awake and oriented X 3, normal affect, Insight and Judgment appropriate.    Vicie Mutters, PA-C 2:35 PM [facility]

## 2016-12-17 NOTE — Patient Instructions (Addendum)
Phentermine  While taking the medication we may ask that you come into the office once a month or once every 2-3 months to monitor your weight, blood pressure, and heart rate. In addition we can help answer your questions about diet, exercise, and help you every step of the way with your weight loss journey. Sometime it is helpful if you bring in a food diary or use an app on your phone such as myfitnesspal to record your calorie intake, especially in the beginning.   You can start out on 1/3 to 1/2 a pill in the morning and if you are tolerating it well you can increase to one pill daily. I also have some patients that take 1/3 or 1/2 at lunch to help prevent night time eating.  This medication is cheapest CASH pay at [facility] is 14-17 dollars and you do NOT need a membership to get meds from there.    What is this medicine? PHENTERMINE (FEN ter meen) decreases your appetite. This medicine is intended to be used in addition to a healthy reduced calorie diet and exercise. The best results are achieved this way. This medicine is only indicated for short-term use. Eventually your weight loss may level out and the medication will no longer be needed.   How should I use this medicine? Take this medicine by mouth. Follow the directions on the prescription label. The tablets should stay in the bottle until immediately before you take your dose. Take your doses at regular intervals. Do not take your medicine more often than directed.  Overdosage: If you think you have taken too much of this medicine contact a poison control center or emergency room at once. NOTE: This medicine is only for you. Do not share this medicine with others.  What if I miss a dose? If you miss a dose, take it as soon as you can. If it is almost time for your next dose, take only that dose. Do not take double or extra doses. Do not increase or in any way change your dose without consulting your  doctor.  What should I watch for while using this medicine? Notify your physician immediately if you become short of breath while doing your normal activities. Do not take this medicine within 6 hours of bedtime. It can keep you from getting to sleep. Avoid drinks that contain caffeine and try to stick to a regular bedtime every night. Do not stand or sit up quickly, especially if you are an older patient. This reduces the risk of dizzy or fainting spells. Avoid alcoholic drinks.  What side effects may I notice from receiving this medicine? Side effects that you should report to your doctor or health care professional as soon as possible: -chest pain, palpitations -depression or severe changes in mood -increased blood pressure -irritability -nervousness or restlessness -severe dizziness -shortness of breath -problems urinating -unusual swelling of the legs -vomiting  Side effects that usually do not require medical attention (report to your doctor or health care professional if they continue or are bothersome): -blurred vision or other eye problems -changes in sexual ability or desire -constipation or diarrhea -difficulty sleeping -dry mouth or unpleasant taste -headache -nausea This list may not describe all possible side effects. Call your doctor for medical advice about side effects. You may report side effects to FDA at 1-800-FDA-1088.   Simple math prevails.    1st - exercise does not produce significant weight loss - at best one  converts fat into muscle , "bulks up", loses inches, but usually stays "weight neutral"     2nd - think of your body weightas a check book: If you eat more calories than you burn up - you save money or gain weight .... Or if you spend more money than you put in the check book, ie burn up more calories than you eat, then you lose weight     3rd - if you walk or run 1 mile, you burn up 100 calories - you have to burn up 3,500 calories to lose 1 pound,  ie you have to walk/run 35 miles to lose 1 measly pound. So if you want to lose 10 #, then you have to walk/run 350 miles, so.... clearly exercise is not the solution.     4. So if you consume 1,500 calories, then you have to burn up the equivalent of 15 miles to stay weight neutral - It also stands to reason that if you consume 1,500 cal/day and don't lose weight, then you must be burning up about 1,500 cals/day to stay weight neutral.     5. If you really want to lose weight, you must cut your calorie intake 300 calories /day and at that rate you should lose about 1 # every 3 days.   6. Please purchase Dr Fara Olden Fuhrman's book(s) "The End of Dieting" & "Eat to Live" . It has some great concepts and recipes.      Ways to cut 100 calories  1. Eat your eggs with hot sauce OR salsa instead of cheese.  Eggs are great for breakfast, but many people consider eggs and cheese to be BFFs. Instead of cheese-1 oz. of cheddar has 114 calories-top your eggs with hot sauce, which contains no calories and helps with satiety and metabolism. Salsa is also a great option!!  2. Top your toast, waffles or pancakes with mashed berries instead of jelly or syrup. Half a cup of berries-fresh, frozen or thawed-has about 40 calories, compared with 2 tbsp. of maple syrup or jelly, which both have about 100 calories. The berries will also give you a good punch of fiber, which helps keep you full and satisfied and won't spike blood sugar quickly like the jelly or syrup. 3. Swap the non-fat latte for black coffee with a splash of half-and-half. Contrary to its name, that non-fat latte has 130 calories and a startling 19g of carbohydrates per 16 oz. serving. Replacing that 'light' drinkable dessert with a black coffee with a splash of half-and-half saves you more than 100 calories per 16 oz. serving. 4. Sprinkle salads with freeze-dried raspberries instead of dried cranberries. If you want a sweet addition to your nutritious  salad, stay away from dried cranberries. They have a whopping 130 calories per  cup and 30g carbohydrates. Instead, sprinkle freeze-dried raspberries guilt-free and save more than 100 calories per  cup serving, adding 3g of belly-filling fiber. 5. Go for mustard in place of mayo on your sandwich. Mustard can add really nice flavor to any sandwich, and there are tons of varieties, from spicy to honey. A serving of mayo is 95 calories, versus 10 calories in a serving of mustard. 6. Choose a DIY salad dressing instead of the store-bought kind. Mix Dijon or whole grain mustard with low-fat Kefir or red wine vinegar and garlic. 7. Use hummus as a spread instead of a dip. Use hummus as a spread on a high-fiber cracker or tortilla with a sandwich and  save on calories without sacrificing taste. 8. Pick just one salad "accessory." Salad isn't automatically a calorie winner. It's easy to over-accessorize with toppings. Instead of topping your salad with nuts, avocado and cranberries (all three will clock in at 313 calories), just pick one. The next day, choose a different accessory, which will also keep your salad interesting. You don't wear all your jewelry every day, right? 9. Ditch the white pasta in favor of spaghetti squash. One cup of cooked spaghetti squash has about 40 calories, compared with traditional spaghetti, which comes with more than 200. Spaghetti squash is also nutrient-dense. It's a good source of fiber and Vitamins A and C, and it can be eaten just like you would eat pasta-with a great tomato sauce and [place] meatballs or with pesto, tofu and spinach, for example. 10. Dress up your chili, soups and stews with non-fat [place] yogurt instead of sour cream. Just a 'dollop' of sour cream can set you back 115 calories and a whopping 12g of fat-seven of which are of the artery-clogging variety. Added bonus: [place] yogurt is packed with muscle-building protein, calcium and B Vitamins. 11. Mash  cauliflower instead of mashed potatoes. One cup of traditional mashed potatoes-in all their creamy goodness-has more than 200 calories, compared to mashed cauliflower, which you can typically eat for less than 100 calories per 1 cup serving. Cauliflower is a great source of the antioxidant indole-3-carbinol (I3C), which may help reduce the risk of some cancers, like breast cancer. 12. Ditch the ice cream sundae in favor of a [place] yogurt parfait. Instead of a cup of ice cream or fro-yo for dessert, try 1 cup of nonfat Greek yogurt topped with fresh berries and a sprinkle of cacao nibs. Both toppings are packed with antioxidants, which can help reduce cellular inflammation and oxidative damage. And the comparison is a no-brainer: One cup of ice cream has about 275 calories; one cup of frozen yogurt has about 230; and a cup of Greek yogurt has just 130, plus twice the protein, so you're less likely to return to the freezer for a second helping. 13. Put olive oil in a spray container instead of using it directly from the bottle. Each tablespoon of olive oil is 120 calories and 15g of fat. Use a mister instead of pouring it straight into the pan or onto a salad. This allows for portion control and will save you more than 100 calories. 14. When baking, substitute canned pumpkin for butter or oil. Canned pumpkin-not pumpkin pie mix-is loaded with Vitamin A, which is important for skin and eye health, as well as immunity. And the comparisons are pretty crazy:  cup of canned pumpkin has about 40 calories, compared to butter or oil, which has more than 800 calories. Yes, 800 calories. Applesauce and mashed banana can also serve as good substitutions for butter or oil, usually in a 1:1 ratio. 15. Top casseroles with high-fiber cereal instead of breadcrumbs. Breadcrumbs are typically made with white bread, while breakfast cereals contain 5-9g of fiber per serving. Not only will you save more than 150 calories per   cup serving, the swap will also keep you more full and you'll get a metabolism boost from the added fiber. 16. Snack on pistachios instead of macadamia nuts. Believe it or not, you get the same amount of calories from 35 pistachios (100 calories) as you would from only five macadamia nuts. 17. Chow down on kale chips rather than potato chips. This is my favorite 'don't  knock it 'till you try it' swap. Kale chips are so easy to make at home, and you can spice them up with a little grated parmesan or chili powder. Plus, they're a mere fraction of the calories of potato chips, but with the same crunch factor we crave so often. 18. Add seltzer and some fruit slices to your cocktail instead of soda or fruit juice. One cup of soda or fruit juice can pack on as much as 140 calories. Instead, use seltzer and fruit slices. The fruit provides valuable phytochemicals, such as flavonoids and anthocyanins, which help to combat cancer and stave off the aging process.

## 2016-12-18 LAB — LIPID PANEL
CHOLESTEROL: 185 mg/dL (ref <200)
HDL: 75 mg/dL (ref >50)
LDL CALC: 86 mg/dL (ref <100)
TRIGLYCERIDES: 120 mg/dL (ref <150)
Total CHOL/HDL Ratio: 2.5 Ratio (ref <5.0)
VLDL: 24 mg/dL (ref <30)

## 2016-12-18 LAB — HEMOGLOBIN A1C
Hgb A1c MFr Bld: 5.4 % (ref <5.7)
MEAN PLASMA GLUCOSE: 108 mg/dL

## 2016-12-18 LAB — HEPATIC FUNCTION PANEL
ALT: 15 U/L (ref 6–29)
AST: 14 U/L (ref 10–35)
Albumin: 4.2 g/dL (ref 3.6–5.1)
Alkaline Phosphatase: 65 U/L (ref 33–130)
Bilirubin, Direct: 0.1 mg/dL
Indirect Bilirubin: 0.2 mg/dL (ref 0.2–1.2)
Total Bilirubin: 0.3 mg/dL (ref 0.2–1.2)
Total Protein: 6.4 g/dL (ref 6.1–8.1)

## 2016-12-18 LAB — BASIC METABOLIC PANEL WITH GFR
BUN: 14 mg/dL (ref 7–25)
CO2: 21 mmol/L (ref 20–31)
Calcium: 9.3 mg/dL (ref 8.6–10.4)
Chloride: 105 mmol/L (ref 98–110)
Creat: 0.83 mg/dL (ref 0.50–0.99)
GFR, EST AFRICAN AMERICAN: 87 mL/min (ref >=60)
GFR, Est Non African American: 75 mL/min (ref >=60)
Glucose, Bld: 94 mg/dL (ref 65–99)
POTASSIUM: 4.1 mmol/L (ref 3.5–5.3)
SODIUM: 141 mmol/L (ref 135–146)

## 2016-12-18 LAB — MAGNESIUM: MAGNESIUM: 1.9 mg/dL (ref 1.5–2.5)

## 2016-12-18 LAB — VITAMIN D 25 HYDROXY (VIT D DEFICIENCY, FRACTURES): VIT D 25 HYDROXY: 37 ng/mL (ref 30–100)

## 2017-01-02 DIAGNOSIS — H43812 Vitreous degeneration, left eye: Secondary | ICD-10-CM | POA: Diagnosis not present

## 2017-01-02 DIAGNOSIS — E119 Type 2 diabetes mellitus without complications: Secondary | ICD-10-CM | POA: Diagnosis not present

## 2017-01-02 DIAGNOSIS — H538 Other visual disturbances: Secondary | ICD-10-CM | POA: Diagnosis not present

## 2017-01-30 DIAGNOSIS — E119 Type 2 diabetes mellitus without complications: Secondary | ICD-10-CM | POA: Diagnosis not present

## 2017-01-30 DIAGNOSIS — H43812 Vitreous degeneration, left eye: Secondary | ICD-10-CM | POA: Diagnosis not present

## 2017-03-11 ENCOUNTER — Other Ambulatory Visit: Payer: Self-pay

## 2017-03-11 DIAGNOSIS — I10 Essential (primary) hypertension: Secondary | ICD-10-CM

## 2017-03-11 MED ORDER — LOSARTAN POTASSIUM 100 MG PO TABS: 100.0000 mg | ORAL_TABLET | Freq: Every day | ORAL | 0 refills | Status: DC

## 2017-04-01 ENCOUNTER — Other Ambulatory Visit: Payer: Self-pay | Admitting: Physician Assistant

## 2017-04-02 ENCOUNTER — Other Ambulatory Visit: Payer: Self-pay | Admitting: Physician Assistant

## 2017-04-10 DIAGNOSIS — H524 Presbyopia: Secondary | ICD-10-CM | POA: Diagnosis not present

## 2017-04-10 DIAGNOSIS — H2513 Age-related nuclear cataract, bilateral: Secondary | ICD-10-CM | POA: Diagnosis not present

## 2017-04-25 ENCOUNTER — Other Ambulatory Visit: Payer: Self-pay | Admitting: Physician Assistant

## 2017-04-25 ENCOUNTER — Other Ambulatory Visit: Payer: Self-pay | Admitting: Internal Medicine

## 2017-04-30 ENCOUNTER — Other Ambulatory Visit: Payer: Self-pay | Admitting: Physician Assistant

## 2017-05-01 ENCOUNTER — Other Ambulatory Visit: Payer: Self-pay | Admitting: Obstetrics and Gynecology

## 2017-05-01 DIAGNOSIS — Z6826 Body mass index (BMI) 26.0-26.9, adult: Secondary | ICD-10-CM | POA: Diagnosis not present

## 2017-05-01 DIAGNOSIS — Z124 Encounter for screening for malignant neoplasm of cervix: Secondary | ICD-10-CM | POA: Diagnosis not present

## 2017-05-01 DIAGNOSIS — Z1231 Encounter for screening mammogram for malignant neoplasm of breast: Secondary | ICD-10-CM | POA: Diagnosis not present

## 2017-05-01 DIAGNOSIS — Z01419 Encounter for gynecological examination (general) (routine) without abnormal findings: Secondary | ICD-10-CM | POA: Diagnosis not present

## 2017-05-01 MED ORDER — SIMVASTATIN 40 MG PO TABS: 40.0000 mg | ORAL_TABLET | Freq: Every day | ORAL | 0 refills | Status: DC

## 2017-05-01 MED ORDER — LEVOTHYROXINE SODIUM 100 MCG PO TABS: 100.0000 ug | ORAL_TABLET | Freq: Every day | ORAL | 0 refills | Status: DC

## 2017-05-02 LAB — CYTOLOGY - PAP

## 2017-05-06 ENCOUNTER — Other Ambulatory Visit: Payer: Self-pay | Admitting: Obstetrics and Gynecology

## 2017-05-06 DIAGNOSIS — R928 Other abnormal and inconclusive findings on diagnostic imaging of breast: Secondary | ICD-10-CM

## 2017-05-13 ENCOUNTER — Ambulatory Visit
Admission: RE | Admit: 2017-05-13 | Discharge: 2017-05-13 | Disposition: A | Discharge status: Home / Self Care | Payer: BLUE CROSS/BLUE SHIELD | Source: Ambulatory Visit | Attending: Obstetrics and Gynecology | Admitting: Obstetrics and Gynecology

## 2017-05-13 DIAGNOSIS — N6001 Solitary cyst of right breast: Secondary | ICD-10-CM | POA: Diagnosis not present

## 2017-05-13 DIAGNOSIS — R922 Inconclusive mammogram: Secondary | ICD-10-CM | POA: Diagnosis not present

## 2017-05-13 DIAGNOSIS — R928 Other abnormal and inconclusive findings on diagnostic imaging of breast: Secondary | ICD-10-CM

## 2017-06-21 ENCOUNTER — Other Ambulatory Visit: Payer: Self-pay | Admitting: Internal Medicine

## 2017-06-30 NOTE — Progress Notes (Signed)
Complete Physical  Assessment and Plan: Stress incontinence Better after PT, continue exercises at home.   Essential hypertension - continue medications, monitor at home if greater than 130/80, , DASH diet, exercise and monitor at home.  - losartan (COZAAR) 100 MG tablet; Take 1 tablet (100 mg total) by mouth daily.  Dispense: 90 tablet; Refill: 3 - CBC with Differential/Platelet - Hepatic function panel - BASIC METABOLIC PANEL WITH GFR - Urinalysis, Routine w reflex microscopic (not at [facility]) - Microalbumin / creatinine urine ratio - EKG 12-Lead  Prediabetes Discussed general issues about diabetes pathophysiology and management., Educational material distributed., Suggested low cholesterol diet., Encouraged aerobic exercise., Discussed foot care., Reminded to get yearly retinal exam. - Hemoglobin A1c - Insulin, fasting  Thyroid disease Hypothyroidism-check TSH level, continue medications the same, reminded to take on an empty stomach 30-72mns before food.  - TSH   Hyperlipidemia -continue medications, check lipids, decrease fatty foods, increase activity.  - Lipid panel  Vitamin D deficiency - Vit D  25 hydroxy (rtn osteoporosis monitoring)  Medication management - Magnesium   Depression, remission Depression- will try to stop wellbutrin due to some anxiety, does not want to try a medication at this time.  stress management techniques discussed, increase water, good sleep hygiene discussed, increase exercise, and increase veggies.   Anemia, unspecified anemia type - Iron and TIBC - Ferritin  Gastroesophageal reflux disease without esophagitis - continue zantac  NASH (nonalcoholic steatohepatitis) Check labs, avoid tylenol, alcohol, weight loss advised.   History of basal cell cancer Follow up Dr. TDenna Haggard has not been there in over a year  Discussed med's effects and SE's. Screening labs and tests as requested with regular follow-up as recommended. Future  Appointments Date Time Provider DMeridian 07/01/2017 2:00 PM CVicie Mutters PA-C [facility] None  07/06/2018 2:00 PM CVicie Mutters PA-C [facility] None    HPI 66y.o. female  presents for a complete physical. Her blood pressure is unknown if it is controlled at home, does not check it, today their BP is BP: (!) 142/88 She does workout. She denies chest pain, shortness of breath, dizziness.  She is on cholesterol medication, vytorin 10/40 and denies myalgias. Her cholesterol is at goal. The cholesterol last visit was:   Lab Results  Component Value Date   CHOL 185 12/17/2016   HDL 75 12/17/2016   LDLCALC 86 12/17/2016   TRIG 120 12/17/2016   CHOLHDL 2.5 12/17/2016   She is prediabetic, denies Dm polys. Last A1C in the office was:  Lab Results  Component Value Date   HGBA1C 5.4 12/17/2016   [name], on 5000 IU daily. Lab Results  Component Value Date   VD25OH 37 12/17/2016     She is on thyroid medication. Her medication was not changed last visit. Patient denies nervousness, palpitations and weight changes.  Lab Results  Component Value Date   TSH 1.45 12/17/2016   She has history of stress incontinence, has improved with pelvic floor PT She is on wellbutrin for depression but states she would like to get off of it due to anxiety.   BMI is Body mass index is 28.28 kg/m., she is working on diet and exercise. Wt Readings from Last 3 Encounters:  07/01/17 186 lb (84.4 kg)  12/17/16 199 lb 12.8 oz (90.6 kg)  06/17/16 191 lb 9.6 oz (86.9 kg)    Current Medications:  Current Outpatient Prescriptions on File Prior to Visit  Medication Sig  . aspirin  81 MG chewable tablet Chew by mouth daily.  Marland Kitchen buPROPion (WELLBUTRIN XL) 150 MG 24 hr tablet TAKE 1 TABLET BY MOUTH DAILY  . Cholecalciferol (VITAMIN D PO) Take 1,000 Int'l Units by mouth daily.  Marland Kitchen estradiol (ESTRACE) 0.5 MG tablet TAKE 1 TABLET BY MOUTH DAILY  . ezetimibe (ZETIA) 10 MG  tablet TAKE 1 TABLET BY MOUTH DAILY  . fluticasone (FLONASE) 50 MCG/ACT nasal spray Place 2 sprays into both nostrils daily.  Marland Kitchen levothyroxine (SYNTHROID, LEVOTHROID) 100 MCG tablet Take 1 tablet (100 mcg total) by mouth daily before breakfast.  . losartan (COZAAR) 100 MG tablet Take 1 tablet (100 mg total) by mouth daily.  . Magnesium 500 MG TABS Take 1 tablet by mouth daily.  . Multiple Vitamins-Minerals (MULTIVITAMIN PO) Take by mouth daily.  Marland Kitchen OVER THE COUNTER MEDICATION Tumeric daily  . phentermine (ADIPEX-P) 37.5 MG tablet Take 1 tablet (37.5 mg total) by mouth daily before breakfast.  . ranitidine (ZANTAC) 300 MG tablet TAKE BY MOUTH TWICE DAILY AS DIRECTED WHILE TRYING TO GET OFF PPI THEN CAN GO TO ONCE AT NIGHT  . simvastatin (ZOCOR) 40 MG tablet Take 1 tablet (40 mg total) by mouth daily at 6 PM.   No current facility-administered medications on file prior to visit.    Health Maintenance:   Immunization History  Administered Date(s) Administered  . Influenza-Unspecified 09/30/2015  . Pneumococcal Polysaccharide-23 03/25/2006  . Td 03/15/2004  . Tdap 06/02/2014   Tetanus: 2015 Pneumovax: N/A Prevnar 13: due age 38 Flu vaccine: 2017 at work for free  Zostavax: will call insurance Pap: 2017 MGM: 04/2017 diagnostic right, gets done at the GYN DEXA: 2007 Colonoscopy: 04/2014 due 10 years EGD: N/A CT AB 2003 AB [place] 2006 CXR 04/2012  Patient Care Team: Unk Pinto, MD as PCP - General (Internal Medicine) Melissa Noon, [place] as Referring Physician (Optometry) Lavonna Monarch, MD as Consulting Physician (Dermatology) Inda Castle, MD as Consulting Physician (Gastroenterology) Bobbye Charleston, MD as Consulting Physician (Obstetrics and Gynecology) Elsie Saas, MD as Consulting Physician (Orthopedic Surgery)  Medical History:  Past Medical History:  Diagnosis Date  . Anemia   . Anxiety   . Cancer (HCC)    basal cell CA- nose  . Colon polyp, hyperplastic     2005  . Depression   . GERD (gastroesophageal reflux disease)   . Hyperlipidemia   . Prediabetes   . Thyroid disease   . Unspecified essential hypertension   . Vitamin D deficiency    Allergies Allergies  Allergen Reactions  . Naldecon Senior [Guaifenesin]     Unsure of reaction  . Prednisone     Unsure of reaction  . Lotensin [Benazepril Hcl] Other (See Comments)    Not effective for pt Not effective for pt    SURGICAL HISTORY She  has a past surgical history that includes Tonsillectomy; Thyroidectomy; Vaginal hysterectomy; Breast lumpectomy; Colonoscopy; Mohs surgery; Knee arthroscopy; and Breast excisional biopsy. FAMILY HISTORY Her family history includes Alzheimer's disease in her father; Heart disease in her mother; Hypertension in her father and mother. SOCIAL HISTORY She  reports that she quit smoking about 12 years ago. She has never used smokeless tobacco. She reports that she drinks alcohol. She reports that she does not use drugs.  Review of Systems  Constitutional: Negative.   HENT: Negative.   Eyes: Negative.   Respiratory: Negative.   Cardiovascular: Negative.   Gastrointestinal: Positive for heartburn (on zantac, still takes occ tums). Negative for abdominal pain, blood in  stool, constipation, diarrhea, melena, nausea and vomiting.  Genitourinary: Negative.   Musculoskeletal: Positive for joint pain (knees, bursitis in left knee occ). Negative for back pain, falls, myalgias and neck pain.  Skin: Negative.   Neurological: Negative.   Endo/Heme/Allergies: Negative.   Psychiatric/Behavioral: Negative.     Physical Exam: Estimated body mass index is 28.28 kg/m as calculated from the following:   Height as of this encounter: 5' 8"  (1.727 m).   Weight as of this encounter: 186 lb (84.4 kg). BP (!) 142/88   Pulse 84   Temp 97.7 F (36.5 C)   Resp 14   Ht 5' 8"  (1.727 m)   Wt 186 lb (84.4 kg)   BMI 28.28 kg/m  General Appearance: Well nourished, in no  apparent distress. Eyes: PERRLA, EOMs, conjunctiva no swelling or erythema, normal fundi and vessels. Sinuses: No Frontal/maxillary tenderness ENT/Mouth: Ext aud canals clear, normal light reflex with TMs without erythema, bulging.  Good dentition. No erythema, swelling, or exudate on post pharynx. Tonsils not swollen or erythematous. Hearing normal.  Neck: Supple, thyroid normal. No bruits Respiratory: Respiratory effort normal, BS equal bilaterally without rales, rhonchi, wheezing or stridor. Cardio: RRR without murmurs, rubs or gallops. Brisk peripheral pulses without edema.  Chest: symmetric, with normal excursions and percussion. Breasts: defer Abdomen: Soft, +BS, non tender, negative murphy's, no guarding, rebound, hernias, masses, or organomegaly. .  Lymphatics: Non tender without lymphadenopathy.  Genitourinary: defer Musculoskeletal: Full ROM all peripheral extremities,5/5 strength, and normal gait. Skin: Warm, dry without rashes, lesions, ecchymosis.  Neuro: Cranial nerves intact, reflexes equal bilaterally. Normal muscle tone, no cerebellar symptoms. Sensation intact.  Psych: Awake and oriented X 3, normal affect, Insight and Judgment appropriate.   EKG: WNL AORTA SCAN: defer   Vicie Mutters 1:35 PM

## 2017-07-01 ENCOUNTER — Encounter: Payer: Self-pay | Admitting: Physician Assistant

## 2017-07-01 ENCOUNTER — Ambulatory Visit (INDEPENDENT_AMBULATORY_CARE_PROVIDER_SITE_OTHER): Payer: BLUE CROSS/BLUE SHIELD | Admitting: Physician Assistant

## 2017-07-01 VITALS — BP 142/88 | HR 84 | Temp 97.7°F | Resp 14 | Ht 68.0 in | Wt 186.0 lb

## 2017-07-01 DIAGNOSIS — Z79899 Other long term (current) drug therapy: Secondary | ICD-10-CM

## 2017-07-01 DIAGNOSIS — Z136 Encounter for screening for cardiovascular disorders: Secondary | ICD-10-CM

## 2017-07-01 DIAGNOSIS — N393 Stress incontinence (female) (male): Secondary | ICD-10-CM

## 2017-07-01 DIAGNOSIS — Z0000 Encounter for general adult medical examination without abnormal findings: Secondary | ICD-10-CM | POA: Diagnosis not present

## 2017-07-01 DIAGNOSIS — E785 Hyperlipidemia, unspecified: Secondary | ICD-10-CM

## 2017-07-01 DIAGNOSIS — Z85828 Personal history of other malignant neoplasm of skin: Secondary | ICD-10-CM

## 2017-07-01 DIAGNOSIS — K7581 Nonalcoholic steatohepatitis (NASH): Secondary | ICD-10-CM

## 2017-07-01 DIAGNOSIS — E559 Vitamin D deficiency, unspecified: Secondary | ICD-10-CM

## 2017-07-01 DIAGNOSIS — R7303 Prediabetes: Secondary | ICD-10-CM

## 2017-07-01 DIAGNOSIS — E079 Disorder of thyroid, unspecified: Secondary | ICD-10-CM

## 2017-07-01 DIAGNOSIS — D649 Anemia, unspecified: Secondary | ICD-10-CM

## 2017-07-01 DIAGNOSIS — I10 Essential (primary) hypertension: Secondary | ICD-10-CM | POA: Diagnosis not present

## 2017-07-01 DIAGNOSIS — K219 Gastro-esophageal reflux disease without esophagitis: Secondary | ICD-10-CM

## 2017-07-01 DIAGNOSIS — Z0001 Encounter for general adult medical examination with abnormal findings: Secondary | ICD-10-CM

## 2017-07-01 DIAGNOSIS — F325 Major depressive disorder, single episode, in full remission: Secondary | ICD-10-CM

## 2017-07-01 LAB — CBC WITH DIFFERENTIAL/PLATELET
Basophils Absolute: 0 cells/uL (ref 0–200)
Basophils Relative: 0 %
EOS ABS: 76 {cells}/uL (ref 15–500)
Eosinophils Relative: 1 %
HCT: 39.2 % (ref 35.0–45.0)
Hemoglobin: 12.9 g/dL (ref 11.7–15.5)
LYMPHS PCT: 24 %
Lymphs Abs: 1824 cells/uL (ref 850–3900)
MCH: 29.9 pg (ref 27.0–33.0)
MCHC: 32.9 g/dL (ref 32.0–36.0)
MCV: 90.7 fL (ref 80.0–100.0)
MONOS PCT: 10 %
MPV: 8.8 fL (ref 7.5–12.5)
Monocytes Absolute: 760 cells/uL (ref 200–950)
Neutro Abs: 4940 cells/uL (ref 1500–7800)
Neutrophils Relative %: 65 %
PLATELETS: 260 10*3/uL (ref 140–400)
RBC: 4.32 MIL/uL (ref 3.80–5.10)
RDW: 13.1 % (ref 11.0–15.0)
WBC: 7.6 10*3/uL (ref 3.8–10.8)

## 2017-07-01 LAB — BASIC METABOLIC PANEL WITH GFR
BUN: 13 mg/dL (ref 7–25)
CHLORIDE: 104 mmol/L (ref 98–110)
CO2: 26 mmol/L (ref 20–31)
CREATININE: 1.21 mg/dL — AB (ref 0.50–0.99)
Calcium: 9.4 mg/dL (ref 8.6–10.4)
GFR, Est African American: 55 mL/min — ABNORMAL LOW (ref >=60)
GFR, Est Non African American: 47 mL/min — ABNORMAL LOW (ref >=60)
GLUCOSE: 108 mg/dL — AB (ref 65–99)
POTASSIUM: 4.1 mmol/L (ref 3.5–5.3)
Sodium: 142 mmol/L (ref 135–146)

## 2017-07-01 LAB — HEPATIC FUNCTION PANEL
ALBUMIN: 4.3 g/dL (ref 3.6–5.1)
ALK PHOS: 62 U/L (ref 33–130)
ALT: 14 U/L (ref 6–29)
AST: 14 U/L (ref 10–35)
BILIRUBIN TOTAL: 0.2 mg/dL (ref 0.2–1.2)
Bilirubin, Direct: 0.1 mg/dL (ref <=0.2)
Indirect Bilirubin: 0.1 mg/dL — ABNORMAL LOW (ref 0.2–1.2)
TOTAL PROTEIN: 6.6 g/dL (ref 6.1–8.1)

## 2017-07-01 LAB — LIPID PANEL
Cholesterol: 188 mg/dL (ref <200)
HDL: 74 mg/dL (ref >50)
LDL CALC: 92 mg/dL (ref <100)
Total CHOL/HDL Ratio: 2.5 Ratio (ref <5.0)
Triglycerides: 111 mg/dL (ref <150)
VLDL: 22 mg/dL (ref <30)

## 2017-07-01 LAB — TSH: TSH: 0.51 m[IU]/L

## 2017-07-01 NOTE — Patient Instructions (Signed)
Monitor your blood pressure at home. Go to the ER if any CP, SOB, nausea, dizziness, severe HA, changes vision/speech  Goal BP:  For patients younger than 60: Goal BP < 140/90. For patients 60 and older: Goal BP < 150/90. For patients with diabetes: Goal BP < 140/90. Your most recent BP: BP: (!) 142/88   Take your medications faithfully as instructed. Maintain a healthy weight. Get at least 150 minutes of aerobic exercise per week. Minimize salt intake. Minimize alcohol intake  DASH Eating Plan DASH stands for "Dietary Approaches to Stop Hypertension." The DASH eating plan is a healthy eating plan that has been shown to reduce high blood pressure (hypertension). Additional health benefits may include reducing the risk of type 2 diabetes mellitus, heart disease, and stroke. The DASH eating plan may also help with weight loss. WHAT DO I NEED TO KNOW ABOUT THE DASH EATING PLAN? For the DASH eating plan, you will follow these general guidelines:  Choose foods with a percent daily value for sodium of less than 5% (as listed on the food label).  Use salt-free seasonings or herbs instead of table salt or sea salt.  Check with your health care provider or pharmacist before using salt substitutes.  Eat lower-sodium products, often labeled as "lower sodium" or "no salt added."  Eat fresh foods.  Eat more vegetables, fruits, and low-fat dairy products.  Choose whole grains. Look for the word "whole" as the first word in the ingredient list.  Choose fish and skinless chicken or [place] more often than red meat. Limit fish, poultry, and meat to 6 oz (170 g) each day.  Limit sweets, desserts, sugars, and sugary drinks.  Choose heart-healthy fats.  Limit cheese to 1 oz (28 g) per day.  Eat more home-cooked food and less restaurant, buffet, and fast food.  Limit fried foods.  Cook foods using methods other than frying.  Limit canned vegetables. If you do use them, rinse them well to  decrease the sodium.  When eating at a restaurant, ask that your food be prepared with less salt, or no salt if possible. WHAT FOODS CAN I EAT? Seek help from a dietitian for individual calorie needs. Grains Whole grain or whole wheat bread. Brown rice. Whole grain or whole wheat pasta. Quinoa, bulgur, and whole grain cereals. Low-sodium cereals. Corn or whole wheat flour tortillas. Whole grain cornbread. Whole grain crackers. Low-sodium crackers. Vegetables Fresh or frozen vegetables (raw, steamed, roasted, or grilled). Low-sodium or reduced-sodium tomato and vegetable juices. Low-sodium or reduced-sodium tomato sauce and paste. Low-sodium or reduced-sodium canned vegetables.  Fruits All fresh, canned (in natural juice), or frozen fruits. Meat and Other Protein Products Ground beef (85% or leaner), grass-fed beef, or beef trimmed of fat. Skinless chicken or [place]. Ground chicken or [place]. Pork trimmed of fat. All fish and seafood. Eggs. Dried beans, peas, or lentils. Unsalted nuts and seeds. Unsalted canned beans. Dairy Low-fat dairy products, such as skim or 1% milk, 2% or reduced-fat cheeses, low-fat ricotta or cottage cheese, or plain low-fat yogurt. Low-sodium or reduced-sodium cheeses. Fats and Oils Tub margarines without trans fats. Light or reduced-fat mayonnaise and salad dressings (reduced sodium). Avocado. Safflower, olive, or canola oils. Natural peanut or almond butter. Other Unsalted popcorn and pretzels. The items listed above may not be a complete list of recommended foods or beverages. Contact your dietitian for more options. WHAT FOODS ARE NOT RECOMMENDED? Grains White bread. White pasta. White rice. Refined cornbread. Bagels and croissants. Crackers that contain  trans fat. Vegetables Creamed or fried vegetables. Vegetables in a cheese sauce. Regular canned vegetables. Regular canned tomato sauce and paste. Regular tomato and vegetable juices. Fruits Dried fruits. Canned  fruit in light or heavy syrup. Fruit juice. Meat and Other Protein Products Fatty cuts of meat. Ribs, chicken wings, bacon, sausage, bologna, salami, chitterlings, fatback, hot dogs, bratwurst, and packaged luncheon meats. Salted nuts and seeds. Canned beans with salt. Dairy Whole or 2% milk, cream, half-and-half, and cream cheese. Whole-fat or sweetened yogurt. Full-fat cheeses or blue cheese. Nondairy creamers and whipped toppings. Processed cheese, cheese spreads, or cheese curds. Condiments Onion and garlic salt, seasoned salt, table salt, and sea salt. Canned and packaged gravies. Worcestershire sauce. Tartar sauce. Barbecue sauce. Teriyaki sauce. Soy sauce, including reduced sodium. Steak sauce. Fish sauce. Oyster sauce. Cocktail sauce. Horseradish. Ketchup and mustard. Meat flavorings and tenderizers. Bouillon cubes. Hot sauce. Tabasco sauce. Marinades. Taco seasonings. Relishes. Fats and Oils Butter, stick margarine, lard, shortening, ghee, and bacon fat. Coconut, palm kernel, or palm oils. Regular salad dressings. Other Pickles and olives. Salted popcorn and pretzels. The items listed above may not be a complete list of foods and beverages to avoid. Contact your dietitian for more information. WHERE CAN I FIND MORE INFORMATION? National Heart, Lung, and Blood Institute: travelstabloid.com Document Released: 12/05/2011 Document Revised: 05/02/2014 Document Reviewed: 10/20/2013 [facility] Patient Information 2015 [place], [place]. This information is not intended to replace advice given to you by your health care provider. Make sure you discuss any questions you have with your health care provider.

## 2017-07-02 ENCOUNTER — Other Ambulatory Visit: Payer: Self-pay | Admitting: Physician Assistant

## 2017-07-02 DIAGNOSIS — N289 Disorder of kidney and ureter, unspecified: Secondary | ICD-10-CM

## 2017-07-02 DIAGNOSIS — R319 Hematuria, unspecified: Secondary | ICD-10-CM

## 2017-07-02 LAB — URINALYSIS, MICROSCOPIC ONLY
BACTERIA UA: NONE SEEN [HPF]
Casts: NONE SEEN [LPF]
Crystals: NONE SEEN [HPF]
RBC / HPF: NONE SEEN RBC/HPF (ref <=2)
SQUAMOUS EPITHELIAL / LPF: NONE SEEN [HPF] (ref <=5)
WBC, UA: NONE SEEN WBC/HPF (ref <=5)
YEAST: NONE SEEN [HPF]

## 2017-07-02 LAB — URINALYSIS, ROUTINE W REFLEX MICROSCOPIC
Bilirubin Urine: NEGATIVE
Glucose, UA: NEGATIVE
KETONES UR: NEGATIVE
LEUKOCYTES UA: NEGATIVE
NITRITE: NEGATIVE
PROTEIN: NEGATIVE
Specific Gravity, Urine: 1.008 (ref 1.001–1.035)
pH: 6 (ref 5.0–8.0)

## 2017-07-02 LAB — MAGNESIUM: MAGNESIUM: 1.9 mg/dL (ref 1.5–2.5)

## 2017-07-02 LAB — MICROALBUMIN / CREATININE URINE RATIO
CREATININE, URINE: 74 mg/dL (ref 20–320)
MICROALB/CREAT RATIO: 12 ug/mg{creat} (ref <30)
Microalb, Ur: 0.9 mg/dL

## 2017-07-02 LAB — VITAMIN D 25 HYDROXY (VIT D DEFICIENCY, FRACTURES): VIT D 25 HYDROXY: 40 ng/mL (ref 30–100)

## 2017-07-07 ENCOUNTER — Encounter: Payer: Self-pay | Admitting: Physician Assistant

## 2017-07-15 ENCOUNTER — Other Ambulatory Visit: Payer: Self-pay | Admitting: Physician Assistant

## 2017-07-17 ENCOUNTER — Encounter: Payer: Self-pay | Admitting: Physician Assistant

## 2017-08-07 ENCOUNTER — Other Ambulatory Visit: Payer: BLUE CROSS/BLUE SHIELD

## 2017-08-07 DIAGNOSIS — N289 Disorder of kidney and ureter, unspecified: Secondary | ICD-10-CM

## 2017-08-07 LAB — BASIC METABOLIC PANEL WITH GFR
BUN: 17 mg/dL (ref 7–25)
CHLORIDE: 104 mmol/L (ref 98–110)
CO2: 25 mmol/L (ref 20–32)
CREATININE: 0.87 mg/dL (ref 0.50–0.99)
Calcium: 9.3 mg/dL (ref 8.6–10.4)
GFR, EST NON AFRICAN AMERICAN: 71 mL/min (ref >=60)
GFR, Est African American: 81 mL/min (ref >=60)
Glucose, Bld: 74 mg/dL (ref 65–99)
POTASSIUM: 4.1 mmol/L (ref 3.5–5.3)
Sodium: 140 mmol/L (ref 135–146)

## 2017-09-12 ENCOUNTER — Other Ambulatory Visit: Payer: Self-pay | Admitting: Internal Medicine

## 2017-09-22 ENCOUNTER — Other Ambulatory Visit: Payer: Self-pay | Admitting: Physician Assistant

## 2017-10-04 ENCOUNTER — Other Ambulatory Visit: Payer: Self-pay | Admitting: Physician Assistant

## 2017-11-06 ENCOUNTER — Ambulatory Visit (INDEPENDENT_AMBULATORY_CARE_PROVIDER_SITE_OTHER): Payer: BLUE CROSS/BLUE SHIELD | Admitting: Physician Assistant

## 2017-11-06 ENCOUNTER — Encounter: Payer: Self-pay | Admitting: Physician Assistant

## 2017-11-06 VITALS — BP 126/72 | HR 75 | Temp 97.6°F | Resp 16 | Ht 68.0 in | Wt 191.6 lb

## 2017-11-06 DIAGNOSIS — Z79899 Other long term (current) drug therapy: Secondary | ICD-10-CM | POA: Diagnosis not present

## 2017-11-06 DIAGNOSIS — K7581 Nonalcoholic steatohepatitis (NASH): Secondary | ICD-10-CM

## 2017-11-06 DIAGNOSIS — E079 Disorder of thyroid, unspecified: Secondary | ICD-10-CM | POA: Diagnosis not present

## 2017-11-06 DIAGNOSIS — F325 Major depressive disorder, single episode, in full remission: Secondary | ICD-10-CM | POA: Diagnosis not present

## 2017-11-06 DIAGNOSIS — I10 Essential (primary) hypertension: Secondary | ICD-10-CM | POA: Diagnosis not present

## 2017-11-06 DIAGNOSIS — E785 Hyperlipidemia, unspecified: Secondary | ICD-10-CM | POA: Diagnosis not present

## 2017-11-06 DIAGNOSIS — Z6828 Body mass index (BMI) 28.0-28.9, adult: Secondary | ICD-10-CM | POA: Diagnosis not present

## 2017-11-06 LAB — LIPID PANEL
CHOL/HDL RATIO: 2.3 (calc) (ref <5.0)
CHOLESTEROL: 211 mg/dL — AB (ref <200)
HDL: 90 mg/dL (ref >50)
LDL CHOLESTEROL (CALC): 102 mg/dL — AB
Non-HDL Cholesterol (Calc): 121 mg/dL (calc) (ref <130)
TRIGLYCERIDES: 101 mg/dL (ref <150)

## 2017-11-06 LAB — CBC WITH DIFFERENTIAL/PLATELET
BASOS PCT: 0.7 %
Basophils Absolute: 54 cells/uL (ref 0–200)
Eosinophils Absolute: 62 cells/uL (ref 15–500)
Eosinophils Relative: 0.8 %
HEMATOCRIT: 37.8 % (ref 35.0–45.0)
Hemoglobin: 13.1 g/dL (ref 11.7–15.5)
LYMPHS ABS: 2041 {cells}/uL (ref 850–3900)
MCH: 30.7 pg (ref 27.0–33.0)
MCHC: 34.7 g/dL (ref 32.0–36.0)
MCV: 88.5 fL (ref 80.0–100.0)
MPV: 9.7 fL (ref 7.5–12.5)
Monocytes Relative: 8.7 %
NEUTROS PCT: 63.3 %
Neutro Abs: 4874 cells/uL (ref 1500–7800)
Platelets: 236 10*3/uL (ref 140–400)
RBC: 4.27 10*6/uL (ref 3.80–5.10)
RDW: 12.4 % (ref 11.0–15.0)
Total Lymphocyte: 26.5 %
WBC: 7.7 10*3/uL (ref 3.8–10.8)
WBCMIX: 670 {cells}/uL (ref 200–950)

## 2017-11-06 LAB — HEPATIC FUNCTION PANEL
AG RATIO: 2 (calc) (ref 1.0–2.5)
ALBUMIN MSPROF: 4.5 g/dL (ref 3.6–5.1)
ALT: 16 U/L (ref 6–29)
AST: 16 U/L (ref 10–35)
Alkaline phosphatase (APISO): 54 U/L (ref 33–130)
Bilirubin, Direct: 0.1 mg/dL (ref 0.0–0.2)
GLOBULIN: 2.3 g/dL (ref 1.9–3.7)
Indirect Bilirubin: 0.2 mg/dL (calc) (ref 0.2–1.2)
TOTAL PROTEIN: 6.8 g/dL (ref 6.1–8.1)
Total Bilirubin: 0.3 mg/dL (ref 0.2–1.2)

## 2017-11-06 LAB — BASIC METABOLIC PANEL WITH GFR
BUN: 14 mg/dL (ref 7–25)
CALCIUM: 9.3 mg/dL (ref 8.6–10.4)
CO2: 27 mmol/L (ref 20–32)
CREATININE: 0.74 mg/dL (ref 0.50–0.99)
Chloride: 105 mmol/L (ref 98–110)
GFR, EST AFRICAN AMERICAN: 99 mL/min/{1.73_m2} (ref >=60)
GFR, EST NON AFRICAN AMERICAN: 86 mL/min/{1.73_m2} (ref >=60)
Glucose, Bld: 91 mg/dL (ref 65–99)
Potassium: 4.5 mmol/L (ref 3.5–5.3)
Sodium: 140 mmol/L (ref 135–146)

## 2017-11-06 LAB — MAGNESIUM: Magnesium: 2.1 mg/dL (ref 1.5–2.5)

## 2017-11-06 LAB — TSH: TSH: 1.2 m[IU]/L (ref 0.40–4.50)

## 2017-11-06 MED ORDER — PHENTERMINE HCL 37.5 MG PO TABS: 37.5000 mg | ORAL_TABLET | Freq: Every day | ORAL | 2 refills | Status: DC

## 2017-11-06 NOTE — Patient Instructions (Addendum)
Aleve twice a day x 2 weeks Rest knee/no pressure And ice it   Prepatellar Bursitis Prepatellar bursitis is inflammation of the prepatellar bursa. The prepatellar bursa is a fluid-filled sac that cushions the kneecap (patella). Prepatellar bursitis happens when fluid builds up in the this sac and causes it to get larger. The condition causes knee pain. What are the causes? This condition may be caused by:  Constant pressure on the knees from kneeling.  A hit to the knee.  Falling on the knee.  A bacterial infection.  Moving the knee often in a forceful way.  What increases the risk? This condition is more likely to develop in:  People who play a sport that involves a risk for falls on the knee or hard hits (blows) to the knee. These sports include: ? Football. ? Wrestling. ? Basketball. ? Soccer.  People who have to kneel for long periods of time, such as roofers, plumbers, and gardeners.  People with another inflammatory condition, such as gout or rheumatoid arthritis.  What are the signs or symptoms? The most common symptom of this condition is knee pain that gets better with rest. Other symptoms include:  Swelling on the front of the kneecap.  Warmth in the knee.  Tenderness with activity.  Redness in the knee.  Inability to bend the knee or to kneel.  How is this diagnosed? This condition may be diagnosed based on:  Your symptoms.  Your medical history.  A physical exam. During the exam, your provider will compare your knees and check for tenderness and pain when moving your knee. Your health care provider may also use a needle to remove fluid from the bursa to help diagnose an infection.  Tests, such as: ? A blood test that checks for infection. ? X-rays. These may be taken to check the structure of the patella. ? MRI or ultrasound. These may be done to check for swelling and fluid buildup in the bursa.  How is this treated? This condition may be  treated by:  Resting the knee.  Applying ice to the knee.  Medicine, such as: ? Nonsteroidal anti-inflammatory drugs (NSAIDs). These can help to reduce pain and swelling. ? Antibiotic medicines. These may be needed if you have an infection. ? Steroid medicines. These may be prescribed if other treatments are not helping.  Raising (elevating) the knee while resting.  Doing strengthening and stretching exercises (physical therapy). These may be recommended after pain and swelling improve.  Having a procedure to remove fluid from the bursa. This may be done if other treatment is not helping.  Having surgery to remove the bursa. This may be done if you have a severe infection or if the condition keeps coming back after treatment.  Follow these instructions at home: Medicines  Take over-the-counter and prescription medicines only as told by your health care provider.  If you were prescribed an antibiotic medicine, take it as told by your health care provider. Do not stop taking the antibiotic even if you start to feel better. Managing pain, stiffness, and swelling  If directed, apply ice to your knee. ? Put ice in a plastic bag. ? Place a towel between your skin and the bag. ? Leave the ice on for 20 minutes, 2-3 times a day.  Elevate your knee above the level of your heart while you are sitting or lying down. Driving  Do not drive or operate heavy machinery while taking prescription pain medicine.  Ask your health care  provider when it is safe fpr you to drive. Activity  Rest your knee.  Avoid activities that cause pain.  Return to your normal activities as told by your health care provider. Ask your health care provider what activities are safe for you.  Do exercises as told by your health care provider. General instructions  Do not use the injured limb to support your body weight until your health care provider says that you can.  Do not use any tobacco products, such  as cigarettes, chewing tobacco, and e-cigarettes. Tobacco can delay bone healing. If you need help quitting, ask your health care provider.  Keep all follow-up visits as told by your health care provider. This is important. How is this prevented?  Warm up and stretch before being active.  Cool down and stretch after being active.  Give your body time to rest between periods of activity.  Make sure to use equipment that fits you.  Be safe and responsible while being active to avoid falls.  Do at least 150 minutes of moderate-intensity exercise each week, such as brisk walking or water aerobics.  Maintain physical fitness, including: ? Strength. ? Flexibility. ? Cardiovascular fitness. ? Endurance. Contact a health care provider if:  Your symptoms do not improve.  Your symptoms get worse.  Your symptoms keep coming back after treatment.  You develop a fever and have warmth, redness, and swelling over your knee. This information is not intended to replace advice given to you by your health care provider. Make sure you discuss any questions you have with your health care provider. Document Released: 12/16/2005 Document Revised: 08/20/2016 Document Reviewed: 09/15/2015 Elsevier Interactive Patient Education  2018 [place]  While taking the medication we may ask that you come into the office once a month or once every 2-3 months to monitor your weight, blood pressure, and heart rate. In addition we can help answer your questions about diet, exercise, and help you every step of the way with your weight loss journey. Sometime it is helpful if you bring in a food diary or use an app on your phone such as myfitnesspal to record your calorie intake, especially in the beginning.   You can start out on 1/3 to 1/2 a pill in the morning and if you are tolerating it well you can increase to one pill daily. I also have some patients that take 1/3 or 1/2 at lunch to help  prevent night time eating.  This medication is cheapest CASH pay at [place] is 14-17 dollars and you do NOT need a membership to get meds from there.    What is this medicine? PHENTERMINE (FEN ter meen) decreases your appetite. This medicine is intended to be used in addition to a healthy reduced calorie diet and exercise. The best results are achieved this way. This medicine is only indicated for short-term use. Eventually your weight loss may level out and the medication will no longer be needed.   How should I use this medicine? Take this medicine by mouth. Follow the directions on the prescription label. The tablets should stay in the bottle until immediately before you take your dose. Take your doses at regular intervals. Do not take your medicine more often than directed.  Overdosage: If you think you have taken too much of this medicine contact a poison control center or emergency room at once. NOTE: This medicine is only for you. Do not share this medicine  with others.  What if I miss a dose? If you miss a dose, take it as soon as you can. If it is almost time for your next dose, take only that dose. Do not take double or extra doses. Do not increase or in any way change your dose without consulting your doctor.  What should I watch for while using this medicine? Notify your physician immediately if you become short of breath while doing your normal activities. Do not take this medicine within 6 hours of bedtime. It can keep you from getting to sleep. Avoid drinks that contain caffeine and try to stick to a regular bedtime every night. Do not stand or sit up quickly, especially if you are an older patient. This reduces the risk of dizzy or fainting spells. Avoid alcoholic drinks.  What side effects may I notice from receiving this medicine? Side effects that you should report to your doctor or health care professional as soon as possible: -chest pain,  palpitations -depression or severe changes in mood -increased blood pressure -irritability -nervousness or restlessness -severe dizziness -shortness of breath -problems urinating -unusual swelling of the legs -vomiting  Side effects that usually do not require medical attention (report to your doctor or health care professional if they continue or are bothersome): -blurred vision or other eye problems -changes in sexual ability or desire -constipation or diarrhea -difficulty sleeping -dry mouth or unpleasant taste -headache -nausea This list may not describe all possible side effects. Call your doctor for medical advice about side effects. You may report side effects to FDA at 1-800-FDA-1088.

## 2017-11-06 NOTE — Progress Notes (Signed)
Assessment and Plan:   Hypertension -Continue medication, monitor blood pressure at home. Continue DASH diet.  Reminder to go to the ER if any CP, SOB, nausea, dizziness, severe HA, changes vision/speech, left arm numbness and tingling and jaw pain.  Cholesterol -Continue diet and exercise. Check cholesterol.   Vitamin D Def - check level and continue medications.  Overweight  long discussion about weight loss, diet, and exercise, will start the patient on phentermine for the holidays- hand out given and AE's discussed, will do close follow up.    Continue diet and meds as discussed. Further disposition pending results of labs. Over 30 minutes of exam, counseling, chart review, and critical decision making was performed Future Appointments  Date Time Provider [place]  07/06/2018  2:00 PM Vicie Mutters, PA-C [facility] None    HPI 66 y.o. female  presents for 3 month follow up on hypertension, cholesterol, prediabetes, and vitamin D deficiency.   Her blood pressure has been controlled at home, today their BP is BP: 126/72  She does workout, stuff at home and kick boxing class 2 x a week.   She denies chest pain, shortness of breath, dizziness.   She is not on cholesterol medication and denies myalgias. Her cholesterol is at goal. The cholesterol last visit was:   Lab Results  Component Value Date   CHOL 188 07/01/2017   HDL 74 07/01/2017   LDLCALC 92 07/01/2017   TRIG 111 07/01/2017   CHOLHDL 2.5 07/01/2017    She has been working on diet and exercise for prediabetes, and denies paresthesia of the feet, polydipsia, polyuria and visual disturbances. Last A1C in the office was:  Lab Results  Component Value Date   HGBA1C 5.4 12/17/2016   [name].   Lab Results  Component Value Date   VD25OH 40 07/01/2017     She is on thyroid medication. Her medication was not changed last visit.   Lab Results  Component Value Date   TSH 0.51 07/01/2017   .  BMI is Body mass index is 29.13 kg/m., she is working on diet and exercise. She would like a RX for phentermine for the holidays, tolerated well a year ago.  Wt Readings from Last 3 Encounters:  11/06/17 191 lb 9.6 oz (86.9 kg)  07/01/17 186 lb (84.4 kg)  12/17/16 199 lb 12.8 oz (90.6 kg)    Current Medications:  Current Outpatient Medications on File Prior to Visit  Medication Sig Dispense Refill  . aspirin 81 MG chewable tablet Chew by mouth daily.    . Cholecalciferol (VITAMIN D PO) Take 1,000 Int'l Units by mouth daily.    Marland Kitchen estradiol (ESTRACE) 0.5 MG tablet TAKE 1 TABLET BY MOUTH EVERY DAY 90 tablet 1  . ezetimibe (ZETIA) 10 MG tablet TAKE 1 TABLET BY MOUTH DAILY GENERIC EQUIVALENT FOR ZETIA 90 tablet 0  . fluticasone (FLONASE) 50 MCG/ACT nasal spray Place 2 sprays into both nostrils daily. 16 g 0  . levothyroxine (SYNTHROID, LEVOTHROID) 100 MCG tablet TAKE 1 TABLET BY MOUTH DAILY BEFORE BREAKFAST. GENERIC EQUIVALENT FOR SYNTHROID. 90 tablet 0  . losartan (COZAAR) 100 MG tablet Take 1 tablet (100 mg total) by mouth daily. 90 tablet 0  . Magnesium 500 MG TABS Take 1 tablet by mouth daily.    . Multiple Vitamins-Minerals (MULTIVITAMIN PO) Take by mouth daily.    Marland Kitchen OVER THE COUNTER MEDICATION Tumeric daily    . phentermine (ADIPEX-P) 37.5 MG tablet Take 1 tablet (  37.5 mg total) by mouth daily before breakfast. 30 tablet 2  . ranitidine (ZANTAC) 300 MG tablet Take 1 tablet 1 to 2 x / day for acid indigestion and reflux 180 tablet 1  . simvastatin (ZOCOR) 40 MG tablet TAKE 1 TABLET BY MOUTH DAILY AT 6PM. 90 tablet 0   No current facility-administered medications on file prior to visit.    Medical History:  Past Medical History:  Diagnosis Date  . Anemia   . Anxiety   . Cancer (HCC)    basal cell CA- nose  . Colon polyp, hyperplastic    2005  . Depression   . GERD (gastroesophageal reflux disease)   . Hyperlipidemia   . Prediabetes   . Thyroid disease   . Unspecified  essential hypertension   . Vitamin D deficiency    Allergies:  Allergies  Allergen Reactions  . Naldecon Senior [Guaifenesin]     Unsure of reaction  . Prednisone     Unsure of reaction  . Lotensin [Benazepril Hcl] Other (See Comments)    Not effective for pt Not effective for pt     Review of Systems:  Review of Systems  Constitutional: Negative.   HENT: Negative.   Eyes: Negative.   Respiratory: Negative.   Cardiovascular: Negative.   Gastrointestinal: Positive for heartburn (better with prilosec). Negative for abdominal pain, blood in stool, constipation, diarrhea, melena, nausea and vomiting.  Genitourinary: Negative.        Stress incontinence better with PT  Musculoskeletal: Positive for joint pain (knees, bursitis in left knee occ). Negative for back pain, falls, myalgias and neck pain.  Skin: Negative.   Neurological: Negative.   Endo/Heme/Allergies: Negative.   Psychiatric/Behavioral: Negative.     Family history- Review and unchanged Social history- Review and unchanged Physical Exam: BP 126/72   Pulse 75   Temp 97.6 F (36.4 C)   Resp 16   Ht 5' 8"  (1.727 m)   Wt 191 lb 9.6 oz (86.9 kg)   SpO2 96%   BMI 29.13 kg/m  Wt Readings from Last 3 Encounters:  11/06/17 191 lb 9.6 oz (86.9 kg)  07/01/17 186 lb (84.4 kg)  12/17/16 199 lb 12.8 oz (90.6 kg)   General Appearance: Well nourished, in no apparent distress. Eyes: PERRLA, EOMs, conjunctiva no swelling or erythema Sinuses: No Frontal/maxillary tenderness ENT/Mouth: Ext aud canals clear, TMs without erythema, bulging. No erythema, swelling, or exudate on post pharynx.  Tonsils not swollen or erythematous. Hearing normal.  Neck: Supple, thyroid normal.  Respiratory: Respiratory effort normal, BS equal bilaterally without rales, rhonchi, wheezing or stridor.  Cardio: RRR with no MRGs. Brisk peripheral pulses without edema.  Abdomen: Soft, + BS,  Non tender, no guarding, rebound, hernias,  masses. Lymphatics: Non tender without lymphadenopathy.  Musculoskeletal: Full ROM, 5/5 strength, normal gait Skin: Warm, dry without rashes, lesions, ecchymosis.  Neuro: Cranial nerves intact. Normal muscle tone, no cerebellar symptoms. Psych: Awake and oriented X 3, normal affect, Insight and Judgment appropriate.    Vicie Mutters, PA-C 2:43 PM [facility]

## 2017-11-11 ENCOUNTER — Other Ambulatory Visit: Payer: Self-pay | Admitting: Physician Assistant

## 2017-11-11 DIAGNOSIS — I10 Essential (primary) hypertension: Secondary | ICD-10-CM

## 2017-12-10 ENCOUNTER — Other Ambulatory Visit: Payer: Self-pay | Admitting: Physician Assistant

## 2017-12-10 ENCOUNTER — Other Ambulatory Visit: Payer: Self-pay | Admitting: Internal Medicine

## 2017-12-13 ENCOUNTER — Other Ambulatory Visit: Payer: Self-pay | Admitting: Physician Assistant

## 2018-02-24 ENCOUNTER — Encounter: Payer: Self-pay | Admitting: Physician Assistant

## 2018-02-24 ENCOUNTER — Other Ambulatory Visit: Payer: Self-pay | Admitting: Physician Assistant

## 2018-02-24 MED ORDER — TELMISARTAN 80 MG PO TABS: 80.0000 mg | ORAL_TABLET | Freq: Every day | ORAL | 1 refills | Status: DC

## 2018-03-19 ENCOUNTER — Other Ambulatory Visit: Payer: Self-pay | Admitting: Internal Medicine

## 2018-03-24 ENCOUNTER — Other Ambulatory Visit: Payer: Self-pay | Admitting: Dermatology

## 2018-03-24 DIAGNOSIS — D485 Neoplasm of uncertain behavior of skin: Secondary | ICD-10-CM | POA: Diagnosis not present

## 2018-05-12 ENCOUNTER — Other Ambulatory Visit: Payer: Self-pay | Admitting: Physician Assistant

## 2018-05-12 ENCOUNTER — Other Ambulatory Visit: Payer: Self-pay | Admitting: Internal Medicine

## 2018-05-16 ENCOUNTER — Other Ambulatory Visit: Payer: Self-pay | Admitting: Physician Assistant

## 2018-05-16 MED ORDER — LEVOTHYROXINE SODIUM 100 MCG PO TABS: ORAL_TABLET | ORAL | 1 refills | Status: DC

## 2018-05-17 ENCOUNTER — Other Ambulatory Visit: Payer: Self-pay | Admitting: Internal Medicine

## 2018-05-17 ENCOUNTER — Encounter: Payer: Self-pay | Admitting: Physician Assistant

## 2018-05-17 MED ORDER — TELMISARTAN 80 MG PO TABS: 80.0000 mg | ORAL_TABLET | Freq: Every day | ORAL | 1 refills | Status: DC

## 2018-05-27 DIAGNOSIS — H2513 Age-related nuclear cataract, bilateral: Secondary | ICD-10-CM | POA: Diagnosis not present

## 2018-06-04 ENCOUNTER — Other Ambulatory Visit: Payer: Self-pay | Admitting: Physician Assistant

## 2018-06-11 ENCOUNTER — Other Ambulatory Visit: Payer: Self-pay | Admitting: Physician Assistant

## 2018-07-03 DIAGNOSIS — E669 Obesity, unspecified: Secondary | ICD-10-CM | POA: Insufficient documentation

## 2018-07-03 DIAGNOSIS — E663 Overweight: Secondary | ICD-10-CM | POA: Insufficient documentation

## 2018-07-03 NOTE — Progress Notes (Signed)
Complete Physical  Assessment and Plan:  Encounter for routine physical exam without abnormal findings  Essential hypertension - continue medications, monitor at home if greater than 130/80, , DASH diet, exercise and monitor at home.  - CBC with Differential/Platelet - CMP/GFR - Urinalysis, Routine w reflex microscopic (not at [facility]) - Microalbumin / creatinine urine ratio - EKG 12-Lead  Other abnormal glucose Discussed general issues about diabetes pathophysiology and management., Educational material distributed., Suggested low cholesterol diet., Encouraged aerobic exercise., Discussed foot care., Reminded to get yearly retinal exam. - Hemoglobin A1c  Thyroid disease Hypothyroidism-check TSH level, continue medications the same, reminded to take on an empty stomach 30-47mns before food.  - TSH   Hyperlipidemia -continue medications, check lipids, decrease fatty foods, increase activity.  - Lipid panel  Vitamin D deficiency - Vit D  25 hydroxy (rtn osteoporosis monitoring)  Medication management - Magnesium   Depression, remission In remission off of medications at this time stress management techniques discussed, increase water, good sleep hygiene discussed, increase exercise, and increase veggies.   Gastroesophageal reflux disease without esophagitis - continue zantac  Stress incontinence Better after PT, continue exercises at home.   NASH (nonalcoholic steatohepatitis) Check labs, avoid tylenol, alcohol, weight loss advised.   History of basal cell cancer Follow up Dr. TDenna Haggard Need for pneumonia vaccine - prevnar 13 administered today  Discussed med's effects and SE's. Screening labs and tests as requested with regular follow-up as recommended. Future Appointments  Date Time Provider [place] 01/12/2019  2:30 PM CVicie Mutters PA-C [facility] None  07/07/2019  2:00 PM CLiane Comber NP [facility] None    HPI 67y.o. female  presents for a  complete physical. She has Essential hypertension; Hyperlipidemia; Thyroid disease; Other abnormal glucose; GERD (gastroesophageal reflux disease); Depression, major, in remission ([place]; Vitamin D deficiency; Medication management; Stress incontinence; NASH (nonalcoholic steatohepatitis); History of basal cell cancer; and Overweight (BMI 25.0-29.9) on their problem list.  She has history of stress incontinence, has improved with pelvic floor PT  she is prescribed phentermine for weight loss but has not been taking over the last several months.  They deny palpitations, anxiety, trouble sleeping, elevated BP.   BMI is Body mass index is 30.26 kg/m., she is working on diet and exercise. Wt Readings from Last 3 Encounters:  07/06/18 199 lb (90.3 kg)  11/06/17 191 lb 9.6 oz (86.9 kg)  07/01/17 186 lb (84.4 kg)    Her blood pressure is controlled at home, rungs 130/70 or so, does not check it, today their BP is BP: 138/80 She does workout. She denies chest pain, shortness of breath, dizziness.   She is on cholesterol medication, zocor, zetia and denies myalgias. Her cholesterol is at goal. The cholesterol last visit was:   Lab Results  Component Value Date   CHOL 211 (H) 11/06/2017   HDL 90 11/06/2017   LDLCALC 102 (H) 11/06/2017   TRIG 101 11/06/2017   CHOLHDL 2.3 11/06/2017   She denies Dm polys. Last A1C in the office was:  Lab Results  Component Value Date   HGBA1C 5.4 12/17/2016   [name], on 1000 IU daily. Lab Results  Component Value Date   VD25OH 40 07/01/2017     She is on thyroid medication. Her medication was not changed last visit. Patient denies nervousness, palpitations and weight changes.  Lab Results  Component Value Date   TSH 1.20 11/06/2017      Current Medications:  Current  Outpatient Medications on File Prior to Visit  Medication Sig  . Cholecalciferol (VITAMIN D PO) Take 1,000 Int'l Units by mouth daily.  Marland Kitchen estradiol (ESTRACE)  0.5 MG tablet TAKE 1 TABLET BY MOUTH EVERY DAY  . ezetimibe (ZETIA) 10 MG tablet TAKE 1 TABLET BY MOUTH DAILY. GENERIC EQUIVALENT FOR ZETIA  . levothyroxine (SYNTHROID, LEVOTHROID) 100 MCG tablet Take 1 tablet every morning with only glass of water for 30 minutes  . Magnesium 500 MG TABS Take 1 tablet by mouth daily.  . Multiple Vitamins-Minerals (MULTIVITAMIN PO) Take by mouth daily.  Marland Kitchen OVER THE COUNTER MEDICATION Tumeric daily  . Potassium 75 MG TABS Take by mouth.  . ranitidine (ZANTAC) 300 MG tablet TAKE 1 TABLET BY MOUTH 1 TO 2 TIMES DAILY FOR ACID INDIGESTION AND REFLUX  . simvastatin (ZOCOR) 40 MG tablet TAKE 1 TABLET BY MOUTH DAILY AT 6PM  . telmisartan (MICARDIS) 80 MG tablet Take 1 tablet (80 mg total) by mouth daily.  Marland Kitchen aspirin 81 MG chewable tablet Chew by mouth daily.  . fluticasone (FLONASE) 50 MCG/ACT nasal spray Place 2 sprays into both nostrils daily. (Patient not taking: Reported on 07/06/2018)   No current facility-administered medications on file prior to visit.    Health Maintenance:   Immunization History  Administered Date(s) Administered  . Influenza-Unspecified 09/30/2015  . Pneumococcal Conjugate-13 07/06/2018  . Pneumococcal Polysaccharide-23 03/25/2006  . Td 03/15/2004  . Tdap 06/02/2014   Tetanus: 2015 Pneumovax: Due in 1 year after prevnar Prevnar 13: DUE  Flu vaccine: 2018 at work for free  Zostavax: will call insurance  Pap: 2018 - Dr. Philis Pique MGM: 04/2017 diagnostic right, gets done at the GYN DEXA: 2007 Colonoscopy: 04/2014 due 10 years EGD: N/A CT AB 2003 AB [place] 2006 CXR 04/2012  Vision: Dr. Coral Ceo, Dr. Clydene Laming, last visit 2018 Dental: Dr. Lajuan Lines, last visit 05/2018, goes q110mSkin: Dr. TDenna Haggard 2019  Patient Care Team: MUnk Pinto MD as PCP - General (Internal Medicine) GMelissa Noon OForest Hillas Referring Physician (Optometry) TLavonna Monarch MD as Consulting Physician (Dermatology) KInda Castle MD (Inactive) as Consulting Physician  (Gastroenterology) HBobbye Charleston MD as Consulting Physician (Obstetrics and Gynecology) WElsie Saas MD as Consulting Physician (Orthopedic Surgery)  Medical History:  Past Medical History:  Diagnosis Date  . Anemia   . Anxiety   . Cancer (HCC)    basal cell CA- nose  . Colon polyp, hyperplastic    2005  . Depression   . GERD (gastroesophageal reflux disease)   . Hyperlipidemia   . Prediabetes   . Thyroid disease   . Unspecified essential hypertension   . Vitamin D deficiency    Allergies Allergies  Allergen Reactions  . Naldecon Senior [Guaifenesin]     Unsure of reaction  . Prednisone     Unsure of reaction  . Lotensin [Benazepril Hcl] Other (See Comments)    Not effective for pt Not effective for pt    SURGICAL HISTORY She  has a past surgical history that includes Tonsillectomy; Thyroidectomy; Vaginal hysterectomy (1994); Breast lumpectomy (Right); Colonoscopy; Mohs surgery; Knee arthroscopy (Right, 2000); and Breast excisional biopsy (Left, 2002). FAMILY HISTORY Her family history includes Alzheimer's disease in her father; Diabetes type II in her sister; Hypertension in her father and mother; Valvular heart disease in her mother. SOCIAL HISTORY She  reports that she quit smoking about 13 years ago. She has never used smokeless tobacco. She reports that she drinks alcohol. She reports that she does not use drugs.  Review of Systems  Constitutional: Negative.  Negative for malaise/fatigue and weight loss.  HENT: Negative.  Negative for hearing loss and tinnitus.   Eyes: Negative.  Negative for blurred vision and double vision.  Respiratory: Negative.  Negative for cough, sputum production, shortness of breath and wheezing.   Cardiovascular: Negative.  Negative for chest pain, palpitations, orthopnea, claudication, leg swelling and PND.  Gastrointestinal: Negative for abdominal pain, blood in stool, constipation, diarrhea, heartburn (on zantac, still takes occ  tums), melena, nausea and vomiting.  Genitourinary: Negative.   Musculoskeletal: Positive for joint pain (knees, bursitis in left knee occ). Negative for back pain, falls, myalgias and neck pain.  Skin: Negative.  Negative for rash.  Neurological: Negative.  Negative for dizziness, tingling, sensory change, weakness and headaches.  Endo/Heme/Allergies: Negative.  Negative for polydipsia.  Psychiatric/Behavioral: Negative.  Negative for depression, memory loss, substance abuse and suicidal ideas. The patient is not nervous/anxious and does not have insomnia.   All other systems reviewed and are negative.   Physical Exam: Estimated body mass index is 30.26 kg/m as calculated from the following:   Height as of this encounter: 5' 8"  (1.727 m).   Weight as of this encounter: 199 lb (90.3 kg). BP 138/80   Pulse 77   Temp 97.7 F (36.5 C)   Ht 5' 8"  (1.727 m)   Wt 199 lb (90.3 kg)   SpO2 96%   BMI 30.26 kg/m  General Appearance: Well nourished, in no apparent distress. Eyes: PERRLA, EOMs, conjunctiva no swelling or erythema, normal fundi and vessels. Sinuses: No Frontal/maxillary tenderness ENT/Mouth: Ext aud canals clear, normal light reflex with TMs without erythema, bulging.  Good dentition. No erythema, swelling, or exudate on post pharynx. Tonsils not swollen or erythematous. Hearing normal.  Neck: Supple, thyroid normal. No bruits Respiratory: Respiratory effort normal, BS equal bilaterally without rales, rhonchi, wheezing or stridor. Cardio: RRR without murmurs, rubs or gallops. Brisk peripheral pulses without edema.  Chest: symmetric, with normal excursions and percussion. Breasts: defer to GYN Abdomen: Soft, +BS, non tender, negative murphy's, no guarding, rebound, hernias, masses, or organomegaly. .  Lymphatics: Non tender without lymphadenopathy.  Genitourinary: defer to GYN Musculoskeletal: Full ROM all peripheral extremities,5/5 strength, and normal gait. Skin: Warm, dry  without rashes, lesions, ecchymosis.  Neuro: Cranial nerves intact, reflexes equal bilaterally. Normal muscle tone, no cerebellar symptoms. Sensation intact.  Psych: Awake and oriented X 3, normal affect, Insight and Judgment appropriate.   EKG: WNL AORTA SCAN: defer   Izora Ribas 2:47 PM

## 2018-07-06 ENCOUNTER — Encounter: Payer: Self-pay | Admitting: Physician Assistant

## 2018-07-06 ENCOUNTER — Ambulatory Visit (INDEPENDENT_AMBULATORY_CARE_PROVIDER_SITE_OTHER): Payer: BLUE CROSS/BLUE SHIELD | Admitting: Adult Health

## 2018-07-06 ENCOUNTER — Encounter: Payer: Self-pay | Admitting: Adult Health

## 2018-07-06 VITALS — BP 138/80 | HR 77 | Temp 97.7°F | Ht 68.0 in | Wt 199.0 lb

## 2018-07-06 DIAGNOSIS — E559 Vitamin D deficiency, unspecified: Secondary | ICD-10-CM

## 2018-07-06 DIAGNOSIS — F325 Major depressive disorder, single episode, in full remission: Secondary | ICD-10-CM

## 2018-07-06 DIAGNOSIS — Z131 Encounter for screening for diabetes mellitus: Secondary | ICD-10-CM

## 2018-07-06 DIAGNOSIS — Z85828 Personal history of other malignant neoplasm of skin: Secondary | ICD-10-CM

## 2018-07-06 DIAGNOSIS — Z23 Encounter for immunization: Secondary | ICD-10-CM | POA: Diagnosis not present

## 2018-07-06 DIAGNOSIS — I10 Essential (primary) hypertension: Secondary | ICD-10-CM | POA: Diagnosis not present

## 2018-07-06 DIAGNOSIS — Z1389 Encounter for screening for other disorder: Secondary | ICD-10-CM

## 2018-07-06 DIAGNOSIS — E079 Disorder of thyroid, unspecified: Secondary | ICD-10-CM

## 2018-07-06 DIAGNOSIS — Z79899 Other long term (current) drug therapy: Secondary | ICD-10-CM | POA: Diagnosis not present

## 2018-07-06 DIAGNOSIS — Z1329 Encounter for screening for other suspected endocrine disorder: Secondary | ICD-10-CM | POA: Diagnosis not present

## 2018-07-06 DIAGNOSIS — Z136 Encounter for screening for cardiovascular disorders: Secondary | ICD-10-CM

## 2018-07-06 DIAGNOSIS — E663 Overweight: Secondary | ICD-10-CM

## 2018-07-06 DIAGNOSIS — Z0001 Encounter for general adult medical examination with abnormal findings: Secondary | ICD-10-CM

## 2018-07-06 DIAGNOSIS — R7309 Other abnormal glucose: Secondary | ICD-10-CM

## 2018-07-06 DIAGNOSIS — E785 Hyperlipidemia, unspecified: Secondary | ICD-10-CM

## 2018-07-06 DIAGNOSIS — Z0000 Encounter for general adult medical examination without abnormal findings: Secondary | ICD-10-CM

## 2018-07-06 DIAGNOSIS — K7581 Nonalcoholic steatohepatitis (NASH): Secondary | ICD-10-CM

## 2018-07-06 DIAGNOSIS — Z13220 Encounter for screening for lipoid disorders: Secondary | ICD-10-CM | POA: Diagnosis not present

## 2018-07-06 DIAGNOSIS — N393 Stress incontinence (female) (male): Secondary | ICD-10-CM

## 2018-07-06 DIAGNOSIS — K219 Gastro-esophageal reflux disease without esophagitis: Secondary | ICD-10-CM

## 2018-07-06 DIAGNOSIS — Z6828 Body mass index (BMI) 28.0-28.9, adult: Secondary | ICD-10-CM

## 2018-07-06 MED ORDER — PHENTERMINE HCL 37.5 MG PO TABS: 37.5000 mg | ORAL_TABLET | Freq: Every day | ORAL | 2 refills | Status: DC

## 2018-07-06 NOTE — Patient Instructions (Addendum)
Ask insurance about shingrix/zostavax coverage   Maintain a healthy weight (BMI <25)  Aim for 7+ servings of fruits and vegetables daily  80+ fluid ounces of water or unsweet tea for healthy kidneys  Limit to max 1 drink of alcohol per day, avoid smoking  Limit animal fats in diet for cholesterol and heart health - choose grass fed whenever available  Aim for low stress - take time to unwind and care for your mental health  Aim for 150 min of moderate intensity exercise weekly for heart health, and weights twice weekly for bone health  Aim for 7-9 hours of sleep daily      When it comes to diets, agreement about the perfect plan isn't easy to find, even among the experts. Experts at the [employer] developed an idea known as the Healthy Eating Plate. Just imagine a plate divided into logical, healthy portions.  The emphasis is on diet quality:  Load up on vegetables and fruits - one-half of your plate: Aim for color and variety, and remember that potatoes don't count.  Go for whole grains - one-quarter of your plate: Whole wheat, barley, wheat berries, quinoa, oats, brown rice, and foods made with them. If you want pasta, go with whole wheat pasta.  Protein power - one-quarter of your plate: Fish, chicken, beans, and nuts are all healthy, versatile protein sources. Limit red meat.  The diet, however, does go beyond the plate, offering a few other suggestions.  Use healthy plant oils, such as olive, canola, soy, corn, sunflower and peanut. Check the labels, and avoid partially hydrogenated oil, which have unhealthy trans fats.  If you're thirsty, drink water. Coffee and tea are good in moderation, but skip sugary drinks and limit milk and dairy products to one or two daily servings.  The type of carbohydrate in the diet is more important than the amount. Some sources of carbohydrates, such as vegetables, fruits, whole grains, and beans-are healthier than  others.  Finally, stay active.

## 2018-07-07 ENCOUNTER — Other Ambulatory Visit: Payer: Self-pay | Admitting: Adult Health

## 2018-07-07 ENCOUNTER — Encounter: Payer: Self-pay | Admitting: Adult Health

## 2018-07-07 DIAGNOSIS — N183 Chronic kidney disease, stage 3 unspecified: Secondary | ICD-10-CM

## 2018-07-07 DIAGNOSIS — R3129 Other microscopic hematuria: Secondary | ICD-10-CM

## 2018-07-07 DIAGNOSIS — N029 Recurrent and persistent hematuria with unspecified morphologic changes: Secondary | ICD-10-CM | POA: Insufficient documentation

## 2018-07-07 LAB — MAGNESIUM: Magnesium: 1.9 mg/dL (ref 1.5–2.5)

## 2018-07-07 LAB — LIPID PANEL
CHOL/HDL RATIO: 2.8 (calc) (ref <5.0)
CHOLESTEROL: 210 mg/dL — AB (ref <200)
HDL: 75 mg/dL (ref >50)
LDL CHOLESTEROL (CALC): 107 mg/dL — AB
Non-HDL Cholesterol (Calc): 135 mg/dL (calc) — ABNORMAL HIGH (ref <130)
Triglycerides: 158 mg/dL — ABNORMAL HIGH (ref <150)

## 2018-07-07 LAB — URINALYSIS W MICROSCOPIC + REFLEX CULTURE
BILIRUBIN URINE: NEGATIVE
Bacteria, UA: NONE SEEN /HPF
GLUCOSE, UA: NEGATIVE
Hyaline Cast: NONE SEEN /LPF
Ketones, ur: NEGATIVE
Leukocyte Esterase: NEGATIVE
NITRITES URINE, INITIAL: NEGATIVE
PH: 5.5 (ref 5.0–8.0)
Protein, ur: NEGATIVE
RBC / HPF: NONE SEEN /HPF (ref 0–2)
SPECIFIC GRAVITY, URINE: 1.017 (ref 1.001–1.03)
Squamous Epithelial / LPF: NONE SEEN /HPF (ref <=5)
WBC, UA: NONE SEEN /HPF (ref 0–5)

## 2018-07-07 LAB — NO CULTURE INDICATED

## 2018-07-07 LAB — CBC WITH DIFFERENTIAL/PLATELET
BASOS ABS: 100 {cells}/uL (ref 0–200)
BASOS PCT: 1.1 %
Eosinophils Absolute: 455 cells/uL (ref 15–500)
Eosinophils Relative: 5 %
HEMATOCRIT: 38 % (ref 35.0–45.0)
HEMOGLOBIN: 12.8 g/dL (ref 11.7–15.5)
LYMPHS ABS: 1638 {cells}/uL (ref 850–3900)
MCH: 30.2 pg (ref 27.0–33.0)
MCHC: 33.7 g/dL (ref 32.0–36.0)
MCV: 89.6 fL (ref 80.0–100.0)
MONOS PCT: 9.4 %
MPV: 9.9 fL (ref 7.5–12.5)
NEUTROS ABS: 6052 {cells}/uL (ref 1500–7800)
Neutrophils Relative %: 66.5 %
Platelets: 250 10*3/uL (ref 140–400)
RBC: 4.24 10*6/uL (ref 3.80–5.10)
RDW: 12.7 % (ref 11.0–15.0)
Total Lymphocyte: 18 %
WBC: 9.1 10*3/uL (ref 3.8–10.8)
WBCMIX: 855 {cells}/uL (ref 200–950)

## 2018-07-07 LAB — COMPLETE METABOLIC PANEL WITH GFR
AG Ratio: 2 (calc) (ref 1.0–2.5)
ALBUMIN MSPROF: 4.5 g/dL (ref 3.6–5.1)
ALT: 16 U/L (ref 6–29)
AST: 14 U/L (ref 10–35)
Alkaline phosphatase (APISO): 65 U/L (ref 33–130)
BUN / CREAT RATIO: 16 (calc) (ref 6–22)
BUN: 18 mg/dL (ref 7–25)
CHLORIDE: 106 mmol/L (ref 98–110)
CO2: 26 mmol/L (ref 20–32)
CREATININE: 1.16 mg/dL — AB (ref 0.50–0.99)
Calcium: 9.2 mg/dL (ref 8.6–10.4)
GFR, EST AFRICAN AMERICAN: 57 mL/min/{1.73_m2} — AB (ref >=60)
GFR, Est Non African American: 49 mL/min/{1.73_m2} — ABNORMAL LOW (ref >=60)
GLOBULIN: 2.3 g/dL (ref 1.9–3.7)
GLUCOSE: 96 mg/dL (ref 65–99)
Potassium: 4.1 mmol/L (ref 3.5–5.3)
SODIUM: 142 mmol/L (ref 135–146)
TOTAL PROTEIN: 6.8 g/dL (ref 6.1–8.1)
Total Bilirubin: 0.3 mg/dL (ref 0.2–1.2)

## 2018-07-07 LAB — MICROALBUMIN / CREATININE URINE RATIO
Creatinine, Urine: 114 mg/dL (ref 20–275)
Microalb Creat Ratio: 12 mcg/mg creat (ref <30)
Microalb, Ur: 1.4 mg/dL

## 2018-07-07 LAB — HEMOGLOBIN A1C
Hgb A1c MFr Bld: 5.5 % of total Hgb (ref <5.7)
MEAN PLASMA GLUCOSE: 111 (calc)
eAG (mmol/L): 6.2 (calc)

## 2018-07-07 LAB — TSH: TSH: 1.03 mIU/L (ref 0.40–4.50)

## 2018-07-07 LAB — VITAMIN D 25 HYDROXY (VIT D DEFICIENCY, FRACTURES): VIT D 25 HYDROXY: 41 ng/mL (ref 30–100)

## 2018-07-14 DIAGNOSIS — Z961 Presence of intraocular lens: Secondary | ICD-10-CM | POA: Diagnosis not present

## 2018-07-14 DIAGNOSIS — H2511 Age-related nuclear cataract, right eye: Secondary | ICD-10-CM | POA: Diagnosis not present

## 2018-07-14 DIAGNOSIS — H2512 Age-related nuclear cataract, left eye: Secondary | ICD-10-CM | POA: Diagnosis not present

## 2018-07-14 DIAGNOSIS — H25811 Combined forms of age-related cataract, right eye: Secondary | ICD-10-CM | POA: Diagnosis not present

## 2018-07-21 DIAGNOSIS — H2512 Age-related nuclear cataract, left eye: Secondary | ICD-10-CM | POA: Diagnosis not present

## 2018-07-21 DIAGNOSIS — H25812 Combined forms of age-related cataract, left eye: Secondary | ICD-10-CM | POA: Diagnosis not present

## 2018-07-30 HISTORY — PX: CATARACT EXTRACTION, BILATERAL: SHX1313

## 2018-08-17 ENCOUNTER — Other Ambulatory Visit: Payer: BLUE CROSS/BLUE SHIELD

## 2018-08-17 DIAGNOSIS — N183 Chronic kidney disease, stage 3 unspecified: Secondary | ICD-10-CM

## 2018-08-17 DIAGNOSIS — R3129 Other microscopic hematuria: Secondary | ICD-10-CM

## 2018-08-17 DIAGNOSIS — E785 Hyperlipidemia, unspecified: Secondary | ICD-10-CM | POA: Diagnosis not present

## 2018-08-18 ENCOUNTER — Encounter: Payer: Self-pay | Admitting: Adult Health

## 2018-08-18 LAB — BASIC METABOLIC PANEL WITH GFR
BUN: 11 mg/dL (ref 7–25)
CALCIUM: 9 mg/dL (ref 8.6–10.4)
CHLORIDE: 107 mmol/L (ref 98–110)
CO2: 29 mmol/L (ref 20–32)
Creat: 0.78 mg/dL (ref 0.50–0.99)
GFR, Est African American: 92 mL/min/{1.73_m2} (ref >=60)
GFR, Est Non African American: 80 mL/min/{1.73_m2} (ref >=60)
GLUCOSE: 88 mg/dL (ref 65–99)
POTASSIUM: 4.2 mmol/L (ref 3.5–5.3)
Sodium: 142 mmol/L (ref 135–146)

## 2018-08-18 LAB — URINALYSIS, ROUTINE W REFLEX MICROSCOPIC
BACTERIA UA: NONE SEEN /HPF
Bilirubin Urine: NEGATIVE
Glucose, UA: NEGATIVE
HYALINE CAST: NONE SEEN /LPF
Ketones, ur: NEGATIVE
Leukocytes, UA: NEGATIVE
Nitrite: NEGATIVE
PROTEIN: NEGATIVE
RBC / HPF: NONE SEEN /HPF (ref 0–2)
Specific Gravity, Urine: 1.008 (ref 1.001–1.03)
Squamous Epithelial / LPF: NONE SEEN /HPF (ref <=5)
WBC UA: NONE SEEN /HPF (ref 0–5)
pH: 7 (ref 5.0–8.0)

## 2018-09-01 ENCOUNTER — Other Ambulatory Visit: Payer: Self-pay | Admitting: Adult Health

## 2018-09-06 MED ORDER — TELMISARTAN 80 MG PO TABS: 80.0000 mg | ORAL_TABLET | Freq: Every day | ORAL | 1 refills | Status: DC

## 2018-10-23 ENCOUNTER — Other Ambulatory Visit: Payer: Self-pay | Admitting: Internal Medicine

## 2018-10-26 ENCOUNTER — Other Ambulatory Visit: Payer: Self-pay | Admitting: *Deleted

## 2018-10-26 MED ORDER — LEVOTHYROXINE SODIUM 100 MCG PO TABS: ORAL_TABLET | ORAL | 1 refills | Status: DC

## 2018-11-13 ENCOUNTER — Other Ambulatory Visit: Payer: Self-pay | Admitting: Internal Medicine

## 2018-11-16 DIAGNOSIS — Z6828 Body mass index (BMI) 28.0-28.9, adult: Secondary | ICD-10-CM

## 2018-11-16 MED ORDER — PHENTERMINE HCL 37.5 MG PO TABS: 37.5000 mg | ORAL_TABLET | Freq: Every day | ORAL | 2 refills | Status: DC

## 2018-11-21 ENCOUNTER — Other Ambulatory Visit: Payer: Self-pay | Admitting: Adult Health

## 2019-01-04 ENCOUNTER — Other Ambulatory Visit: Payer: Self-pay | Admitting: Dermatology

## 2019-01-04 DIAGNOSIS — L570 Actinic keratosis: Secondary | ICD-10-CM | POA: Diagnosis not present

## 2019-01-04 DIAGNOSIS — D485 Neoplasm of uncertain behavior of skin: Secondary | ICD-10-CM | POA: Diagnosis not present

## 2019-01-12 ENCOUNTER — Encounter: Payer: Self-pay | Admitting: Physician Assistant

## 2019-01-12 ENCOUNTER — Ambulatory Visit (INDEPENDENT_AMBULATORY_CARE_PROVIDER_SITE_OTHER): Payer: BLUE CROSS/BLUE SHIELD | Admitting: Physician Assistant

## 2019-01-12 VITALS — BP 130/80 | HR 74 | Temp 98.5°F | Ht 68.0 in | Wt 201.6 lb

## 2019-01-12 DIAGNOSIS — F325 Major depressive disorder, single episode, in full remission: Secondary | ICD-10-CM | POA: Diagnosis not present

## 2019-01-12 DIAGNOSIS — E663 Overweight: Secondary | ICD-10-CM

## 2019-01-12 DIAGNOSIS — K7581 Nonalcoholic steatohepatitis (NASH): Secondary | ICD-10-CM | POA: Diagnosis not present

## 2019-01-12 DIAGNOSIS — E079 Disorder of thyroid, unspecified: Secondary | ICD-10-CM | POA: Diagnosis not present

## 2019-01-12 DIAGNOSIS — I10 Essential (primary) hypertension: Secondary | ICD-10-CM

## 2019-01-12 DIAGNOSIS — Z79899 Other long term (current) drug therapy: Secondary | ICD-10-CM

## 2019-01-12 DIAGNOSIS — E785 Hyperlipidemia, unspecified: Secondary | ICD-10-CM | POA: Diagnosis not present

## 2019-01-12 DIAGNOSIS — R7309 Other abnormal glucose: Secondary | ICD-10-CM

## 2019-01-12 MED ORDER — ROSUVASTATIN CALCIUM 20 MG PO TABS: 20.0000 mg | ORAL_TABLET | Freq: Every day | ORAL | 3 refills | Status: DC

## 2019-01-12 NOTE — Patient Instructions (Signed)
Stop the simvastatin and zetia- start on the crestor once a day We will send you to see cardiology   Atrial Fibrillation Atrial fibrillation is a type of irregular or rapid heartbeat (arrhythmia). In atrial fibrillation, the top part of the heart (atria) quivers in a chaotic pattern. This makes the heart unable to pump blood normally. Having atrial fibrillation can increase your risk for other health problems, such as:  Blood can pool in the atria and form clots. If a clot travels to the brain, it can cause a stroke.  The heart muscle may weaken from the irregular blood flow. This can cause heart failure. Atrial fibrillation may start suddenly and stop on its own, or it may become a long-lasting problem. What are the causes? This condition is caused by some heart-related conditions or procedures, including:  High blood pressure. This is the most common cause.  Heart failure.  Heart valve conditions.  Inflammation of the sac that surrounds the heart (pericarditis).  Heart surgery.  Coronary artery disease.  Certain heart rhythm disorders, such as Wolf-Parkinson-White syndrome. Other causes include:  Pneumonia.  Obstructive sleep apnea.  Lung cancer.  Thyroid problems, especially if the thyroid is overactive (hyperthyroidism).  Excessive alcohol or drug use. Sometimes, the cause of this condition is not known. What increases the risk? This condition is more likely to develop in:  Older people.  People who smoke.  People who have diabetes mellitus.  People who are overweight (obese).  Athletes who exercise vigorously.  People who have a family history. What are the signs or symptoms? Symptoms of this condition include:  A feeling that your heart is beating rapidly or irregularly.  A feeling of discomfort or pain in your chest.  Shortness of breath.  Sudden light-headedness or weakness.  Getting tired easily during exercise. In some cases, there are no  symptoms. How is this diagnosed? Your health care provider may be able to detect atrial fibrillation when taking your pulse. If detected, this condition may be diagnosed with:  Electrocardiogram (ECG).  Ambulatory cardiac monitor. This device records your heartbeats for 24 hours or more.  Transthoracic echocardiogram (TTE) to evaluate how blood flows through your heart.  Transesophageal echocardiogram (TEE) to view more detailed images of your heart.  A stress test.  Imaging tests, such as a CT scan or chest X-ray.  Blood tests. How is this treated? This condition may be treated with:  Medicines to slow down the heart rate or bring the heart's rhythm back to normal.  Medicines to prevent blood clots from forming.  Electrical cardioversion. This delivers a low-energy shock to the heart to reset its rhythm.  Ablation. This procedure destroys the part of the heart tissue that sends abnormal signals.  Left atrial appendage occlusion/excision. This seals off a common place in the atria where blood clots can form (left atrial appendage). The goal of treatment is to prevent blood clots from forming and to keep your heart beating at a normal rate and rhythm. Treatment depends on underlying medical conditions and how you feel when you are experiencing fibrillation. Follow these instructions at home: Medicines  Take over-the counter and prescription medicines only as told by your health care provider.  If your health care provider prescribed a blood-thinning medicine (anticoagulant), take it exactly as told. Taking too much blood-thinning medicine can cause bleeding. Taking too little can enable a blood clot to form and travel to the brain, causing a stroke. Lifestyle      Do not  use any products that contain nicotine or tobacco, such as cigarettes and e-cigarettes. If you need help quitting, ask your health care provider.  Do not drink beverages that contain caffeine, such as  coffee, soda, and tea.  Follow diet instructions as told by your health care provider.  Exercise regularly as told by your health care provider.  Do not drink alcohol. General instructions  If you have obstructive sleep apnea, manage your condition as told by your health care provider.  Maintain a healthy weight. Do not use diet pills unless your health care provider approves. Diet pills may make heart problems worse.  Keep all follow-up visits as told by your health care provider. This is important. Contact a health care provider if you:  Notice a change in the rate, rhythm, or strength of your heartbeat.  Are taking an anticoagulant and you notice increased bruising.  Tire more easily when you exercise or exert yourself.  Have a sudden change in weight. Get help right away if you have:   Chest pain, abdominal pain, sweating, or weakness.  Difficulty breathing.  Blood in your vomit, stool (feces), or urine.  Any symptoms of a stroke. "BE FAST" is an easy way to remember the main warning signs of a stroke: ? B - Balance. Signs are dizziness, sudden trouble walking, or loss of balance. ? E - Eyes. Signs are trouble seeing or a sudden change in vision. ? F - Face. Signs are sudden weakness or numbness of the face, or the face or eyelid drooping on one side. ? A - Arms. Signs are weakness or numbness in an arm. This happens suddenly and usually on one side of the body. ? S - Speech. Signs are sudden trouble speaking, slurred speech, or trouble understanding what people say. ? T - Time. Time to call emergency services. Write down what time symptoms started.  Other signs of a stroke, such as: ? A sudden, severe headache with no known cause. ? Nausea or vomiting. ? Seizure. These symptoms may represent a serious problem that is an emergency. Do not wait to see if the symptoms will go away. Get medical help right away. Call your local emergency services (911 in the [place] Do not  drive yourself to the hospital. Summary  Atrial fibrillation is a type of irregular or rapid heartbeat (arrhythmia).  Symptoms include a feeling that your heart is beating fast or irregularly. In some cases, you may not have symptoms.  The condition is treated with medicines to slow down the heart rate or bring the heart's rhythm back to normal. You may also need blood-thinning medicines to prevent blood clots.  Get help right away if you have symptoms or signs of a stroke. This information is not intended to replace advice given to you by your health care provider. Make sure you discuss any questions you have with your health care provider. Document Released: 12/16/2005 Document Revised: 02/06/2018 Document Reviewed: 02/06/2018 Elsevier Interactive Patient Education  2019 [place] mindful eating and here are some tips and tricks below.   Rate your hunger before you eat on a scale of 1-10, try to eat closer to a 6 or higher. And if you are at below that, why are you eating? Slow down and listen to your body.

## 2019-01-12 NOTE — Progress Notes (Signed)
Assessment and Plan:   Essential hypertension - continue medications, DASH diet, exercise and monitor at home. Call if greater than 130/80.  Patient requesting referral to cardiology.  -     Ambulatory referral to Cardiology -     rosuvastatin (CRESTOR) 20 MG tablet; Take 1 tablet (20 mg total) by mouth daily. -     CBC with Differential/Platelet -     COMPLETE METABOLIC PANEL WITH GFR -     TSH  NASH (nonalcoholic steatohepatitis) Check labs, avoid tylenol, alcohol, weight loss advised.   Thyroid disease Hypothyroidism-check TSH level, continue medications the same, reminded to take on an empty stomach 30-71mns before food.  -     TSH  Depression, major, in remission ([place] - continue medications, stress management techniques discussed, increase water, good sleep hygiene discussed, increase exercise, and increase veggies.   Hyperlipidemia, unspecified hyperlipidemia type Will switch from simvastatin/zetia to crestor -     Lipid panel  Medication management -     Magnesium  Overweight (BMI 25.0-29.9)  Overweight  - long discussion about weight loss, diet, and exercise -recommended diet heavy in fruits and veggies and low in animal meats, cheeses, and dairy products   Continue diet and meds as discussed. Further disposition pending results of labs. Over 30 minutes of exam, counseling, chart review, and critical decision making was performed Future Appointments  Date Time [place] 01/12/2019  2:30 PM CVicie Mutters PA-C [facility] None  07/07/2019  2:00 PM CLiane Comber NP [facility] None    HPI 68y.o. female  presents for 3 month follow up on hypertension, cholesterol, prediabetes, and vitamin D deficiency.   Her blood pressure has been controlled at home, today their BP is BP: 130/80  She does workout, stuff at home and kick boxing class 2 x a week.   She denies chest pain, shortness of breath, dizziness.   Sister recently diagnosed with atrial  fibrillation. HTN and valvular disease in mother, and HTN in father. No other family heart history. She has not issues but she would like to see a cardiologist for piece of mind.    She is on cholesterol medication, simvastatin 40 mg daily and zetia, still not at goal. and denies myalgias. Her cholesterol is at goal. The cholesterol last visit was:   Lab Results  Component Value Date   CHOL 210 (H) 07/06/2018   HDL 75 07/06/2018   LDLCALC 107 (H) 07/06/2018   TRIG 158 (H) 07/06/2018   CHOLHDL 2.8 07/06/2018    She has been working on diet and exercise for prediabetes, and denies paresthesia of the feet, polydipsia, polyuria and visual disturbances. Last A1C in the office was:  Lab Results  Component Value Date   HGBA1C 5.5 07/06/2018   [name].   Lab Results  Component Value Date   VD25OH 41 07/06/2018     She is on thyroid medication. Her medication was not changed last visit.   Lab Results  Component Value Date   TSH 1.03 07/06/2018  .  BMI is Body mass index is 30.65 kg/m., she is working on diet and exercise. She would like a RX for phentermine for the holidays, tolerated well a year ago.  Wt Readings from Last 3 Encounters:  01/12/19 201 lb 9.6 oz (91.4 kg)  07/06/18 199 lb (90.3 kg)  11/06/17 191 lb 9.6 oz (86.9 kg)    Current Medications:  Current Outpatient Medications on File Prior to Visit  Medication Sig Dispense Refill  . aspirin 81 MG chewable tablet Chew by mouth daily.    . Cholecalciferol (VITAMIN D PO) Take 1,000 Int'l Units by mouth daily.    Marland Kitchen estradiol (ESTRACE) 0.5 MG tablet TAKE 1 TABLET BY MOUTH EVERY DAY 90 tablet 0  . ezetimibe (ZETIA) 10 MG tablet TAKE 1 TABLET BY MOUTH DAILY GENERIC EQUIVALENT FOR ZETIA 90 tablet 0  . fluticasone (FLONASE) 50 MCG/ACT nasal spray Place 2 sprays into both nostrils daily. 16 g 0  . levothyroxine (SYNTHROID, LEVOTHROID) 100 MCG tablet Take 1 tablet every morning with only glass of water for  30 minutes 90 tablet 1  . Magnesium 500 MG TABS Take 1 tablet by mouth daily.    . Multiple Vitamins-Minerals (MULTIVITAMIN PO) Take by mouth daily.    Marland Kitchen OVER THE COUNTER MEDICATION Tumeric daily    . phentermine (ADIPEX-P) 37.5 MG tablet Take 1 tablet (37.5 mg total) by mouth daily before breakfast. 30 tablet 2  . Potassium 75 MG TABS Take by mouth.    . ranitidine (ZANTAC) 300 MG tablet TAKE 1 TABLET BY MOUTH 1 TO 2 TIMES DAILY FOR ACID INDIGESTION AND REFLUX 180 tablet 1  . simvastatin (ZOCOR) 40 MG tablet TAKE 1 TABLET BY MOUTH DAILY AT 6PM 90 tablet 1  . telmisartan (MICARDIS) 80 MG tablet Take 1 tablet (80 mg total) by mouth daily. 90 tablet 1   No current facility-administered medications on file prior to visit.    Medical History:  Past Medical History:  Diagnosis Date  . Anemia   . Anxiety   . Cancer (HCC)    basal cell CA- nose  . Colon polyp, hyperplastic    2005  . Depression   . GERD (gastroesophageal reflux disease)   . Hyperlipidemia   . Prediabetes   . Thyroid disease   . Unspecified essential hypertension   . Vitamin D deficiency    Allergies:  Allergies  Allergen Reactions  . Naldecon Senior [Guaifenesin]     Unsure of reaction  . Prednisone     Unsure of reaction  . Lotensin [Benazepril Hcl] Other (See Comments)    Not effective for pt Not effective for pt     Review of Systems:  Review of Systems  Constitutional: Negative.   HENT: Negative.   Eyes: Negative.   Respiratory: Negative.   Cardiovascular: Negative.   Gastrointestinal: Positive for heartburn (better with prilosec). Negative for abdominal pain, blood in stool, constipation, diarrhea, melena, nausea and vomiting.  Genitourinary: Negative.        Stress incontinence better with PT  Musculoskeletal: Positive for joint pain (knees, bursitis in left knee occ). Negative for back pain, falls, myalgias and neck pain.  Skin: Negative.   Neurological: Negative.   Endo/Heme/Allergies: Negative.    Psychiatric/Behavioral: Negative.     Family history- Review and unchanged Social history- Review and unchanged Physical Exam: BP 130/80   Pulse 74   Temp 98.5 F (36.9 C)   Ht 5' 8"  (1.727 m)   Wt 201 lb 9.6 oz (91.4 kg)   SpO2 97%   BMI 30.65 kg/m  Wt Readings from Last 3 Encounters:  01/12/19 201 lb 9.6 oz (91.4 kg)  07/06/18 199 lb (90.3 kg)  11/06/17 191 lb 9.6 oz (86.9 kg)   General Appearance: Well nourished, in no apparent distress. Eyes: PERRLA, EOMs, conjunctiva no swelling or erythema Sinuses: No Frontal/maxillary tenderness ENT/Mouth: Ext aud canals clear, TMs without erythema, bulging. No erythema, swelling, or  exudate on post pharynx.  Tonsils not swollen or erythematous. Hearing normal.  Neck: Supple, thyroid normal.  Respiratory: Respiratory effort normal, BS equal bilaterally without rales, rhonchi, wheezing or stridor.  Cardio: RRR with no MRGs. Brisk peripheral pulses without edema.  Abdomen: Soft, + BS,  Non tender, no guarding, rebound, hernias, masses. Lymphatics: Non tender without lymphadenopathy.  Musculoskeletal: Full ROM, 5/5 strength, normal gait Skin: Warm, dry without rashes, lesions, ecchymosis.  Neuro: Cranial nerves intact. Normal muscle tone, no cerebellar symptoms. Psych: Awake and oriented X 3, normal affect, Insight and Judgment appropriate.    Vicie Mutters, PA-C 2:06 PM [facility]

## 2019-01-13 LAB — LIPID PANEL
CHOLESTEROL: 201 mg/dL — AB (ref <200)
HDL: 74 mg/dL (ref >50)
LDL Cholesterol (Calc): 97 mg/dL (calc)
NON-HDL CHOLESTEROL (CALC): 127 mg/dL (ref <130)
TRIGLYCERIDES: 208 mg/dL — AB (ref <150)
Total CHOL/HDL Ratio: 2.7 (calc) (ref <5.0)

## 2019-01-13 LAB — CBC WITH DIFFERENTIAL/PLATELET
Absolute Monocytes: 666 cells/uL (ref 200–950)
BASOS PCT: 0.5 %
Basophils Absolute: 37 cells/uL (ref 0–200)
EOS PCT: 1.5 %
Eosinophils Absolute: 111 cells/uL (ref 15–500)
HEMATOCRIT: 37.1 % (ref 35.0–45.0)
HEMOGLOBIN: 12.5 g/dL (ref 11.7–15.5)
LYMPHS ABS: 1569 {cells}/uL (ref 850–3900)
MCH: 30.1 pg (ref 27.0–33.0)
MCHC: 33.7 g/dL (ref 32.0–36.0)
MCV: 89.4 fL (ref 80.0–100.0)
MPV: 9.6 fL (ref 7.5–12.5)
Monocytes Relative: 9 %
NEUTROS ABS: 5017 {cells}/uL (ref 1500–7800)
Neutrophils Relative %: 67.8 %
Platelets: 266 10*3/uL (ref 140–400)
RBC: 4.15 10*6/uL (ref 3.80–5.10)
RDW: 12.3 % (ref 11.0–15.0)
Total Lymphocyte: 21.2 %
WBC: 7.4 10*3/uL (ref 3.8–10.8)

## 2019-01-13 LAB — COMPLETE METABOLIC PANEL WITH GFR
AG Ratio: 2.1 (calc) (ref 1.0–2.5)
ALKALINE PHOSPHATASE (APISO): 59 U/L (ref 33–130)
ALT: 19 U/L (ref 6–29)
AST: 16 U/L (ref 10–35)
Albumin: 4.4 g/dL (ref 3.6–5.1)
BUN: 14 mg/dL (ref 7–25)
CALCIUM: 9.2 mg/dL (ref 8.6–10.4)
CO2: 26 mmol/L (ref 20–32)
Chloride: 103 mmol/L (ref 98–110)
Creat: 0.91 mg/dL (ref 0.50–0.99)
GFR, Est African American: 77 mL/min/{1.73_m2} (ref >=60)
GFR, Est Non African American: 66 mL/min/{1.73_m2} (ref >=60)
Globulin: 2.1 g/dL (calc) (ref 1.9–3.7)
Glucose, Bld: 76 mg/dL (ref 65–99)
Potassium: 3.8 mmol/L (ref 3.5–5.3)
SODIUM: 141 mmol/L (ref 135–146)
Total Bilirubin: 0.4 mg/dL (ref 0.2–1.2)
Total Protein: 6.5 g/dL (ref 6.1–8.1)

## 2019-01-13 LAB — TSH: TSH: 0.27 mIU/L — ABNORMAL LOW (ref 0.40–4.50)

## 2019-01-13 LAB — MAGNESIUM: Magnesium: 1.9 mg/dL (ref 1.5–2.5)

## 2019-02-05 ENCOUNTER — Other Ambulatory Visit: Payer: Self-pay | Admitting: Physician Assistant

## 2019-02-05 ENCOUNTER — Other Ambulatory Visit: Payer: Self-pay | Admitting: Internal Medicine

## 2019-02-05 NOTE — Progress Notes (Signed)
Cardiology Office Note   Date:  02/09/2019   ID:  Tonique, Cheyenne Conway 1951-03-24, MRN [id number]  PCP:  Unk Pinto, MD  Cardiologist:   Peter Martinique, MD   Chief Complaint  Patient presents with  . Hypertension  . Hyperlipidemia      History of Present Illness: Cheyenne Conway is a 68 y.o. female who is seen at the request of Vicie Mutters PA-C for evaluation of HTN. She has a history of HLD and HTN. She has a family history of HTN, AFib and valvular disease. She became concerned since her sister recently developed Afib and wanted to assess cardiac risk. She is generally in good health. Has been taking Micardis for BP and reports good control. Recently started on Crestor. Is prediabetic and is working on eating less sweets. She exercises regularly with aerobic on elliptical and weight lifting twice a week. Denies any dyspnea. Very rare chest tightness. No palpitations, dizziness, edema.     Past Medical History:  Diagnosis Date  . Anemia   . Anxiety   . Cancer (HCC)    basal cell CA- nose  . Colon polyp, hyperplastic    2005  . Depression   . GERD (gastroesophageal reflux disease)   . Hyperlipidemia   . Prediabetes   . Thyroid disease   . Unspecified essential hypertension   . Vitamin D deficiency     Past Surgical History:  Procedure Laterality Date  . BREAST EXCISIONAL BIOPSY Left 2002  . BREAST LUMPECTOMY Right    In the 90s  . COLONOSCOPY    . KNEE ARTHROSCOPY Right 2000   knee "cleanup" of right knee cartiledge, Dr. Toni Amend?   Marland Kitchen MOHS SURGERY    . THYROIDECTOMY     In the 53s  . TONSILLECTOMY    . VAGINAL HYSTERECTOMY  1994     Current Outpatient Medications  Medication Sig Dispense Refill  . Cholecalciferol (VITAMIN D PO) Take 1,000 Int'l Units by mouth daily.    Marland Kitchen estradiol (ESTRACE) 0.5 MG tablet TAKE 1 TABLET BY MOUTH EVERY DAY 90 tablet 0  . fluticasone (FLONASE) 50 MCG/ACT nasal spray Place 2 sprays into both nostrils daily. 16 g 0  .  levothyroxine (SYNTHROID, LEVOTHROID) 100 MCG tablet Take 1 tablet every morning with only glass of water for 30 minutes 90 tablet 1  . Magnesium 500 MG TABS Take 1 tablet by mouth daily.    . Multiple Vitamins-Minerals (MULTIVITAMIN PO) Take by mouth daily.    Marland Kitchen OVER THE COUNTER MEDICATION Tumeric daily    . phentermine (ADIPEX-P) 37.5 MG tablet Take 1 tablet (37.5 mg total) by mouth daily before breakfast. 30 tablet 2  . Potassium 75 MG TABS Take by mouth.    . rosuvastatin (CRESTOR) 20 MG tablet Take 1 tablet (20 mg total) by mouth daily. 90 tablet 3  . telmisartan (MICARDIS) 80 MG tablet TAKE 1 TABLET BY MOUTH DAILY 90 tablet 1  . TURMERIC PO turmeric (bulk)     No current facility-administered medications for this visit.     Allergies:   Naldecon senior [guaifenesin]; Prednisone; and Benazepril hcl    Social History:  The patient  reports that she quit smoking about 13 years ago. Her smoking use included cigarettes. She has a 2.50 pack-year smoking history. She has never used smokeless tobacco. She reports current alcohol use. She reports that she does not use drugs.   Family History:  The patient's family history includes Alzheimer's disease in  her father; Arrhythmia in her sister; Diabetes type II in her sister; Hypertension in her father and mother; Valvular heart disease in her mother.    ROS:  Please see the history of present illness.   Otherwise, review of systems are positive for none.   All other systems are reviewed and negative.    PHYSICAL EXAM: VS:  BP (!) 152/96 (BP Location: Left Arm)   Pulse 74   Ht 5' 10"  (1.778 m)   Wt 198 lb 12.8 oz (90.2 kg)   BMI 28.52 kg/m  , BMI Body mass index is 28.52 kg/m. GEN: Well nourished, well developed, in no acute distress  HEENT: normal  Neck: no JVD, carotid bruits, or masses Cardiac: RRR; no murmurs, rubs, or gallops,no edema  Respiratory:  clear to auscultation bilaterally, normal work of breathing GI: soft, nontender,  nondistended, + BS MS: no deformity or atrophy  Skin: warm and dry, no rash Neuro:  Strength and sensation are intact Psych: euthymic mood, full affect   EKG:  EKG is ordered today. The ekg ordered today demonstrates NSR rate 74. Normal. I have personally reviewed and interpreted this study.    Recent Labs: 01/12/2019: ALT 19; BUN 14; Creat 0.91; Hemoglobin 12.5; Magnesium 1.9; Platelets 266; Potassium 3.8; Sodium 141; TSH 0.27    Lipid Panel    Component Value Date/Time   CHOL 201 (H) 01/12/2019 1417   TRIG 208 (H) 01/12/2019 1417   HDL 74 01/12/2019 1417   CHOLHDL 2.7 01/12/2019 1417   VLDL 22 07/01/2017 1356   LDLCALC 97 01/12/2019 1417      Wt Readings from Last 3 Encounters:  02/09/19 198 lb 12.8 oz (90.2 kg)  01/12/19 201 lb 9.6 oz (91.4 kg)  07/06/18 199 lb (90.3 kg)      Other studies Reviewed: Additional studies/ records that were reviewed today include: none   ASSESSMENT AND PLAN:  1.  HTN. Elevated today but has been under good control on Micardis. Continue Rx. DASH diet. 2. HLD recently started on Crestor. 3. Hypothyroidism. On replacement.  I think she is taking appropriate steps to modify her CV risk with BP and sugar control, lipid lowering therapy, and active lifestyle. Recommend she stop taking ASA as recent studies do not support its use for primary prevention. No further cardiac work up needed at this time. Will call if new symptoms develop.    Current medicines are reviewed at length with the patient today.  The patient does not have concerns regarding medicines.  The following changes have been made:  Stop ASA  Labs/ tests ordered today include: none No orders of the defined types were placed in this encounter.    Disposition:   FU PRN  Signed, Peter Martinique, MD  02/09/2019 9:04 AM    [facility] [address] [place], [place], [address] Phone [phone], Fax [phone]

## 2019-02-09 ENCOUNTER — Encounter: Payer: Self-pay | Admitting: Cardiology

## 2019-02-09 ENCOUNTER — Ambulatory Visit (INDEPENDENT_AMBULATORY_CARE_PROVIDER_SITE_OTHER): Payer: BLUE CROSS/BLUE SHIELD | Admitting: Cardiology

## 2019-02-09 VITALS — BP 152/96 | HR 74 | Ht 70.0 in | Wt 198.8 lb

## 2019-02-09 DIAGNOSIS — I10 Essential (primary) hypertension: Secondary | ICD-10-CM

## 2019-02-09 DIAGNOSIS — E7800 Pure hypercholesterolemia, unspecified: Secondary | ICD-10-CM

## 2019-02-09 NOTE — Patient Instructions (Signed)
Stop ASA

## 2019-02-12 ENCOUNTER — Other Ambulatory Visit: Payer: Self-pay | Admitting: Internal Medicine

## 2019-02-17 ENCOUNTER — Other Ambulatory Visit: Payer: Self-pay | Admitting: Internal Medicine

## 2019-04-01 DIAGNOSIS — H26491 Other secondary cataract, right eye: Secondary | ICD-10-CM | POA: Diagnosis not present

## 2019-04-01 DIAGNOSIS — H43811 Vitreous degeneration, right eye: Secondary | ICD-10-CM | POA: Diagnosis not present

## 2019-04-01 DIAGNOSIS — E119 Type 2 diabetes mellitus without complications: Secondary | ICD-10-CM | POA: Diagnosis not present

## 2019-04-06 ENCOUNTER — Other Ambulatory Visit: Payer: Self-pay | Admitting: Physician Assistant

## 2019-04-30 ENCOUNTER — Other Ambulatory Visit: Payer: Self-pay | Admitting: Physician Assistant

## 2019-05-04 ENCOUNTER — Other Ambulatory Visit: Payer: Self-pay | Admitting: Internal Medicine

## 2019-05-28 DIAGNOSIS — Z961 Presence of intraocular lens: Secondary | ICD-10-CM | POA: Diagnosis not present

## 2019-05-28 DIAGNOSIS — H26491 Other secondary cataract, right eye: Secondary | ICD-10-CM | POA: Diagnosis not present

## 2019-05-28 DIAGNOSIS — H26492 Other secondary cataract, left eye: Secondary | ICD-10-CM | POA: Diagnosis not present

## 2019-05-28 DIAGNOSIS — Z9889 Other specified postprocedural states: Secondary | ICD-10-CM | POA: Diagnosis not present

## 2019-06-08 DIAGNOSIS — H26491 Other secondary cataract, right eye: Secondary | ICD-10-CM | POA: Diagnosis not present

## 2019-07-06 NOTE — Progress Notes (Signed)
Complete Physical  Assessment and Plan:  Encounter for routine physical exam without abnormal findings  Essential hypertension - continue medications, monitor at home if greater than 130/80, , DASH diet, exercise and monitor at home.  - CBC with Differential/Platelet - CMP/GFR - Urinalysis, Routine w reflex microscopic (not at [facility]) - Microalbumin / creatinine urine ratio - EKG 12-Lead  Other abnormal glucose Discussed general issues about diabetes pathophysiology and management., Educational material distributed., Suggested low cholesterol diet., Encouraged aerobic exercise., Discussed foot care., Reminded to get yearly retinal exam. - Hemoglobin A1c  Thyroid disease Hypothyroidism-check TSH level, continue medications the same, reminded to take on an empty stomach 30-54mns before food.  - TSH   Hyperlipidemia -continue medications, check lipids, decrease fatty foods, increase activity.  - Lipid panel  Vitamin D deficiency - Vit D  25 hydroxy (rtn osteoporosis monitoring)  Medication management - Magnesium   Depression, remission In remission off of medications at this time stress management techniques discussed, increase water, good sleep hygiene discussed, increase exercise, and increase veggies.   Gastroesophageal reflux disease without esophagitis - continue PRN OTC H2i/tums  Stress incontinence Better after PT, continue exercises at home.   NASH (nonalcoholic steatohepatitis) Check labs, avoid tylenol, alcohol, weight loss advised.   History of basal cell cancer Follow up Dr. TDenna Haggard Idiopathic hematuria (microscopic) Stable/unchanged for 4+ years; saw urology and no further workup recommended per patient  BMI 30 Long discussion about weight loss, diet, and exercise Discussed final goal weight and current weight loss goal (<190lb) Patient will work on cut down on snacking, increase exercise Patient has been on phentermine with benefit and no SE, taking  drug breaks; restart; continue close follow up.   Need for pneumonia vaccine - 23 valent administered today  Discussed med's effects and SE's. Screening labs and tests as requested with regular follow-up as recommended. Future Appointments  Date Time Provider [place] 07/07/2019  2:00 PM CLiane Comber NP [facility] None  07/12/2020  2:00 PM CLiane Comber NP [facility] None    HPI 68y.o. female  presents for a complete physical. She has Essential hypertension; Hyperlipidemia; Thyroid disease; Other abnormal glucose; GERD (gastroesophageal reflux disease); Depression, major, in remission [place]; Vitamin D deficiency; Medication management; Stress incontinence; NASH (nonalcoholic steatohepatitis); History of basal cell cancer; Overweight (BMI 25.0-29.9); and Idiopathic hematuria on their problem list.   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   She has history of stress incontinence, has improved with pelvic floor PT. She follows with GYN annually.  Idiopathic hematuria, was evaluated by urology and no further workup recommended, has been stable.   she is prescribed phentermine for weight loss but has not been taking over the last several months.  They deny palpitations, anxiety, trouble sleeping, elevated BP.   BMI is Body mass index is 30.71 kg/m., she is working on diet and exercise, [facility], admits diet is not at good in the last several  Wt Readings from Last 3 Encounters:  07/07/19 202 lb (91.6 kg)  02/09/19 198 lb 12.8 oz (90.2 kg)  01/12/19 201 lb 9.6 oz (91.4 kg)    Her blood pressure is controlled at home, rungs 130/70 or so, does not check it, today their BP is BP: 134/76 She does workout. She denies chest pain, shortness of breath, dizziness.   She is on cholesterol medication, rosuvastatin 20 mg daily and  denies myalgias. Her LDL cholesterol is at goal.  The cholesterol last visit was:   Lab Results  Component Value Date   CHOL 201 (H) 01/12/2019   HDL 74 01/12/2019   LDLCALC 97 01/12/2019   TRIG 208 (H) 01/12/2019   CHOLHDL 2.7 01/12/2019   She denies Dm polys. Last A1C in the office was:  Lab Results  Component Value Date   HGBA1C 5.5 07/06/2018   [name], on 2000 IU daily. Lab Results  Component Value Date   VD25OH 41 07/06/2018     She is on thyroid medication. Her medication was changed last visit. Patient denies nervousness, palpitations and weight changes. She takes 100 mcg 1 tab daily except Sundays takes 1/2 tab.  Lab Results  Component Value Date   TSH 0.27 (L) 01/12/2019     Lab Results  Component Value Date   GFRNONAA 66 01/12/2019       Current Medications:  Current Outpatient Medications on File Prior to Visit  Medication Sig  . Cholecalciferol (VITAMIN D PO) Take 1,000 Int'l Units by mouth daily.  Marland Kitchen estradiol (ESTRACE) 0.5 MG tablet TAKE 1 TABLET BY MOUTH EVERY DAY  . fluticasone (FLONASE) 50 MCG/ACT nasal spray Place 2 sprays into both nostrils daily.  Marland Kitchen levothyroxine (SYNTHROID, LEVOTHROID) 100 MCG tablet TAKE 1 TABLET BY MOUTH EVERY MORNING WITH ONLY GLASS OF WATER FOR 30 MINUTES GENERIC EQUIVALENT FOR SYNTHROID  . Magnesium 500 MG TABS Take 1 tablet by mouth daily.  . Multiple Vitamins-Minerals (MULTIVITAMIN PO) Take by mouth daily.  Marland Kitchen OVER THE COUNTER MEDICATION Tumeric daily  . phentermine (ADIPEX-P) 37.5 MG tablet Take 1 tablet (37.5 mg total) by mouth daily before breakfast.  . Potassium 75 MG TABS Take by mouth.  . rosuvastatin (CRESTOR) 20 MG tablet Take 1 tablet (20 mg total) by mouth daily.  Marland Kitchen telmisartan (MICARDIS) 80 MG tablet Take 1 tablet Daily for BP  . TURMERIC PO turmeric (bulk)   No current facility-administered medications on file prior to visit.    Health Maintenance:   Immunization History  Administered Date(s) Administered  . Influenza-Unspecified  09/30/2015, 09/29/2016  . MMR 05/30/2014  . Pneumococcal Conjugate-13 07/06/2018  . Pneumococcal Polysaccharide-23 03/25/2006  . Td 03/15/2004  . Tdap 06/02/2014   Tetanus: 2015 Pneumovax: 2007, DUE given today  Prevnar 13: 2019 Flu vaccine: 2019 at work for free  Zostavax: will call insurance  Pap: 2018 - Dr. Philis Pique MGM: 04/2017 diagnostic right, gets done at the GYN DEXA: 2007 via GYN Colonoscopy: 04/2014 due 10 years EGD: N/A CT AB 2003 AB [place] 2006 CXR 04/2012  Vision: Dr. Coral Ceo, Dr. Clydene Laming, last visit 2019 Dental: Dr. Lajuan Lines, last visit 06/2019, goes [id number]: Dr. TDenna Haggard 2019,  Patient Care Team: MUnk Pinto MD as PCP - General (Internal Medicine) [place]elissa Noon [place] Referring Physician (Optometry) TLavonna Monarch MD as Consulting Physician (Dermatology) KInda Castle MD (Inactive) as Consulting Physician (Gastroenterology) HBobbye Charleston MD as Consulting Physician (Obstetrics and Gynecology) WElsie Saas MD as Consulting Physician (Orthopedic Surgery)  Medical History:  Past Medical History:  Diagnosis Date  . Anemia   . Anxiety   . Cancer (HCC)    basal cell CA- nose  . Colon polyp, hyperplastic    2005  . Depression   . GERD (gastroesophageal reflux disease)   . Hyperlipidemia   . Prediabetes   . Thyroid disease   . Unspecified essential hypertension   . Vitamin D deficiency  Allergies Allergies  Allergen Reactions  . Naldecon Senior [Guaifenesin]     Unsure of reaction  . Prednisone     Unsure of reaction  . Benazepril Hcl Other (See Comments) and Cough    Not effective for pt Not effective for pt Not effective for pt    SURGICAL HISTORY She  has a past surgical history that includes Tonsillectomy; Thyroidectomy; Vaginal hysterectomy (1994); Breast lumpectomy (Right); Colonoscopy; Mohs surgery; Knee arthroscopy (Right, 2000); and Breast excisional biopsy (Left, 2002). FAMILY HISTORY Her family history includes Alzheimer's  disease in her father; Atrial fibrillation in her sister; Diabetes type II in her sister; Hypertension in her father and mother; Valvular heart disease in her mother. SOCIAL HISTORY She  reports that she quit smoking about 14 years ago. Her smoking use included cigarettes. She has a 2.50 pack-year smoking history. She has never used smokeless tobacco. She reports current alcohol use of about 3.0 standard drinks of alcohol per week. She reports that she does not use drugs.  Review of Systems  Constitutional: Negative.  Negative for malaise/fatigue and weight loss.  HENT: Negative.  Negative for hearing loss and tinnitus.   Eyes: Negative.  Negative for blurred vision and double vision.  Respiratory: Negative.  Negative for cough, sputum production, shortness of breath and wheezing.   Cardiovascular: Negative.  Negative for chest pain, palpitations, orthopnea, claudication, leg swelling and PND.  Gastrointestinal: Negative for abdominal pain, blood in stool, constipation, diarrhea, heartburn (on H2i, still takes occ tums), melena, nausea and vomiting.  Genitourinary: Negative.   Musculoskeletal: Negative for back pain, falls, joint pain, myalgias and neck pain.  Skin: Negative.  Negative for rash.  Neurological: Negative.  Negative for dizziness, tingling, sensory change, weakness and headaches.  Endo/Heme/Allergies: Negative.  Negative for polydipsia.  Psychiatric/Behavioral: Negative.  Negative for depression, memory loss, substance abuse and suicidal ideas. The patient is not nervous/anxious and does not have insomnia.   All other systems reviewed and are negative.   Physical Exam: Estimated body mass index is 30.71 kg/m as calculated from the following:   Height as of this encounter: 5' 8"  (1.727 m).   Weight as of this encounter: 202 lb (91.6 kg). BP 134/76   Pulse 77   Temp (!) 97.5 F (36.4 C)   Ht 5' 8"  (1.727 m)   Wt 202 lb (91.6 kg)   SpO2 98%   BMI 30.71 kg/m  General  Appearance: Well nourished, in no apparent distress. Eyes: PERRLA, EOMs, conjunctiva no swelling or erythema, normal fundi and vessels. Sinuses: No Frontal/maxillary tenderness ENT/Mouth: Ext aud canals clear, normal light reflex with TMs without erythema, bulging.  Good dentition. No erythema, swelling, or exudate on post pharynx. Tonsils not swollen or erythematous. Hearing normal.  Neck: Supple, thyroid normal. No bruits Respiratory: Respiratory effort normal, BS equal bilaterally without rales, rhonchi, wheezing or stridor. Cardio: RRR without murmurs, rubs or gallops. Brisk peripheral pulses without edema.  Chest: symmetric, with normal excursions and percussion. Breasts: defer to GYN Abdomen: Soft, +BS, non tender, negative murphy's, no guarding, rebound, hernias, masses, or organomegaly. .  Lymphatics: Non tender without lymphadenopathy.  Genitourinary: defer to GYN Musculoskeletal: Full ROM all peripheral extremities,5/5 strength, and normal gait. Skin: Warm, dry without rashes, lesions, ecchymosis.  Neuro: Cranial nerves intact, reflexes equal bilaterally. Normal muscle tone, no cerebellar symptoms. Sensation intact.  Psych: Awake and oriented X 3, normal affect, Insight and Judgment appropriate.   EKG: WNL AORTA SCAN: defer   Izora Ribas 1:53  PM

## 2019-07-07 ENCOUNTER — Ambulatory Visit (INDEPENDENT_AMBULATORY_CARE_PROVIDER_SITE_OTHER): Payer: BC Managed Care – PPO | Admitting: Adult Health

## 2019-07-07 ENCOUNTER — Other Ambulatory Visit: Payer: Self-pay

## 2019-07-07 ENCOUNTER — Encounter: Payer: Self-pay | Admitting: Adult Health

## 2019-07-07 VITALS — BP 134/76 | HR 77 | Temp 97.5°F | Ht 68.0 in | Wt 202.0 lb

## 2019-07-07 DIAGNOSIS — Z1389 Encounter for screening for other disorder: Secondary | ICD-10-CM

## 2019-07-07 DIAGNOSIS — Z23 Encounter for immunization: Secondary | ICD-10-CM

## 2019-07-07 DIAGNOSIS — E559 Vitamin D deficiency, unspecified: Secondary | ICD-10-CM | POA: Diagnosis not present

## 2019-07-07 DIAGNOSIS — N029 Recurrent and persistent hematuria with unspecified morphologic changes: Secondary | ICD-10-CM

## 2019-07-07 DIAGNOSIS — K219 Gastro-esophageal reflux disease without esophagitis: Secondary | ICD-10-CM

## 2019-07-07 DIAGNOSIS — Z0001 Encounter for general adult medical examination with abnormal findings: Secondary | ICD-10-CM

## 2019-07-07 DIAGNOSIS — Z136 Encounter for screening for cardiovascular disorders: Secondary | ICD-10-CM | POA: Diagnosis not present

## 2019-07-07 DIAGNOSIS — R7309 Other abnormal glucose: Secondary | ICD-10-CM

## 2019-07-07 DIAGNOSIS — Z0000 Encounter for general adult medical examination without abnormal findings: Secondary | ICD-10-CM

## 2019-07-07 DIAGNOSIS — Z13220 Encounter for screening for lipoid disorders: Secondary | ICD-10-CM

## 2019-07-07 DIAGNOSIS — Z79899 Other long term (current) drug therapy: Secondary | ICD-10-CM

## 2019-07-07 DIAGNOSIS — E663 Overweight: Secondary | ICD-10-CM

## 2019-07-07 DIAGNOSIS — E785 Hyperlipidemia, unspecified: Secondary | ICD-10-CM

## 2019-07-07 DIAGNOSIS — I10 Essential (primary) hypertension: Secondary | ICD-10-CM

## 2019-07-07 DIAGNOSIS — K7581 Nonalcoholic steatohepatitis (NASH): Secondary | ICD-10-CM

## 2019-07-07 DIAGNOSIS — Z6830 Body mass index (BMI) 30.0-30.9, adult: Secondary | ICD-10-CM

## 2019-07-07 DIAGNOSIS — Z1329 Encounter for screening for other suspected endocrine disorder: Secondary | ICD-10-CM

## 2019-07-07 DIAGNOSIS — Z85828 Personal history of other malignant neoplasm of skin: Secondary | ICD-10-CM

## 2019-07-07 DIAGNOSIS — E079 Disorder of thyroid, unspecified: Secondary | ICD-10-CM

## 2019-07-07 DIAGNOSIS — F325 Major depressive disorder, single episode, in full remission: Secondary | ICD-10-CM

## 2019-07-07 DIAGNOSIS — N393 Stress incontinence (female) (male): Secondary | ICD-10-CM

## 2019-07-07 MED ORDER — PHENTERMINE HCL 37.5 MG PO TABS: 37.5000 mg | ORAL_TABLET | Freq: Every day | ORAL | 2 refills | Status: DC

## 2019-07-07 NOTE — Addendum Note (Signed)
Addended by: Elsie Amis D on: 07/07/2019 02:25 PM   Modules accepted: Orders

## 2019-07-07 NOTE — Patient Instructions (Addendum)
  Cheyenne Conway , Thank you for taking time to come for your Annual Wellness Visit. I appreciate your ongoing commitment to your health goals. Please review the following plan we discussed and let me know if I can assist you in the future.   These are the goals we discussed: Goals    . Weight (lb) < 190 lb (86.2 kg)       This is a list of the screening recommended for you and due dates:  Health Maintenance  Topic Date Due  . DEXA scan (bone density measurement)  05/04/2018  . Mammogram  05/22/2019  . Pneumonia vaccines (2 of 2 - PPSV23) 07/07/2019  . Flu Shot  07/31/2019  . Colon Cancer Screening  05/03/2024  . Tetanus Vaccine  06/02/2024  .  Hepatitis C: One time screening is recommended by Center for Disease Control  (CDC) for  adults born from [birth year] through 1963.   Completed    Ask insurance about shingles vaccine - zostavax vs shingrix (shingrix is newer but better, would need to get through a pharmacy)    Know what a healthy weight is for you (roughly BMI <25) and aim to maintain this  Aim for 7+ servings of fruits and vegetables daily  65-80+ fluid ounces of water or unsweet tea for healthy kidneys  Limit to max 1 drink of alcohol per day; avoid smoking/tobacco  Limit animal fats in diet for cholesterol and heart health - choose grass fed whenever available  Avoid highly processed foods, and foods high in saturated/trans fats  Aim for low stress - take time to unwind and care for your mental health  Aim for 150 min of moderate intensity exercise weekly for heart health, and weights twice weekly for bone health  Aim for 7-9 hours of sleep daily

## 2019-07-08 ENCOUNTER — Other Ambulatory Visit: Payer: Self-pay | Admitting: Adult Health

## 2019-07-08 LAB — LIPID PANEL
Cholesterol: 209 mg/dL — ABNORMAL HIGH (ref <200)
HDL: 77 mg/dL (ref >=50)
LDL Cholesterol (Calc): 107 mg/dL (calc) — ABNORMAL HIGH
Non-HDL Cholesterol (Calc): 132 mg/dL (calc) — ABNORMAL HIGH (ref <130)
Total CHOL/HDL Ratio: 2.7 (calc) (ref <5.0)
Triglycerides: 131 mg/dL (ref <150)

## 2019-07-08 LAB — COMPLETE METABOLIC PANEL WITH GFR
AG Ratio: 1.9 (calc) (ref 1.0–2.5)
ALT: 18 U/L (ref 6–29)
AST: 15 U/L (ref 10–35)
Albumin: 4.6 g/dL (ref 3.6–5.1)
Alkaline phosphatase (APISO): 62 U/L (ref 37–153)
BUN: 15 mg/dL (ref 7–25)
CO2: 27 mmol/L (ref 20–32)
Calcium: 9.8 mg/dL (ref 8.6–10.4)
Chloride: 104 mmol/L (ref 98–110)
Creat: 0.83 mg/dL (ref 0.50–0.99)
GFR, Est African American: 85 mL/min/{1.73_m2} (ref >=60)
GFR, Est Non African American: 73 mL/min/{1.73_m2} (ref >=60)
Globulin: 2.4 g/dL (calc) (ref 1.9–3.7)
Glucose, Bld: 91 mg/dL (ref 65–99)
Potassium: 4 mmol/L (ref 3.5–5.3)
Sodium: 139 mmol/L (ref 135–146)
Total Bilirubin: 0.3 mg/dL (ref 0.2–1.2)
Total Protein: 7 g/dL (ref 6.1–8.1)

## 2019-07-08 LAB — CBC WITH DIFFERENTIAL/PLATELET
Absolute Monocytes: 734 cells/uL (ref 200–950)
Basophils Absolute: 29 cells/uL (ref 0–200)
Basophils Relative: 0.4 %
Eosinophils Absolute: 137 cells/uL (ref 15–500)
Eosinophils Relative: 1.9 %
HCT: 38.6 % (ref 35.0–45.0)
Hemoglobin: 13.4 g/dL (ref 11.7–15.5)
Lymphs Abs: 1764 cells/uL (ref 850–3900)
MCH: 30.8 pg (ref 27.0–33.0)
MCHC: 34.7 g/dL (ref 32.0–36.0)
MCV: 88.7 fL (ref 80.0–100.0)
MPV: 9.8 fL (ref 7.5–12.5)
Monocytes Relative: 10.2 %
Neutro Abs: 4536 cells/uL (ref 1500–7800)
Neutrophils Relative %: 63 %
Platelets: 252 10*3/uL (ref 140–400)
RBC: 4.35 10*6/uL (ref 3.80–5.10)
RDW: 12.8 % (ref 11.0–15.0)
Total Lymphocyte: 24.5 %
WBC: 7.2 10*3/uL (ref 3.8–10.8)

## 2019-07-08 LAB — URINALYSIS, ROUTINE W REFLEX MICROSCOPIC
Bacteria, UA: NONE SEEN /HPF
Bilirubin Urine: NEGATIVE
Glucose, UA: NEGATIVE
Hyaline Cast: NONE SEEN /LPF
Ketones, ur: NEGATIVE
Leukocytes,Ua: NEGATIVE
Nitrite: NEGATIVE
Protein, ur: NEGATIVE
Specific Gravity, Urine: 1.007 (ref 1.001–1.03)
Squamous Epithelial / LPF: NONE SEEN /HPF (ref <=5)
WBC, UA: NONE SEEN /HPF (ref 0–5)
pH: <=5 (ref 5.0–8.0)

## 2019-07-08 LAB — TSH: TSH: 1.22 mIU/L (ref 0.40–4.50)

## 2019-07-08 LAB — MICROALBUMIN / CREATININE URINE RATIO
Creatinine, Urine: 42 mg/dL (ref 20–275)
Microalb Creat Ratio: 36 mcg/mg creat — ABNORMAL HIGH (ref <30)
Microalb, Ur: 1.5 mg/dL

## 2019-07-08 LAB — MAGNESIUM: Magnesium: 2.1 mg/dL (ref 1.5–2.5)

## 2019-07-08 LAB — VITAMIN D 25 HYDROXY (VIT D DEFICIENCY, FRACTURES): Vit D, 25-Hydroxy: 35 ng/mL (ref 30–100)

## 2019-07-08 LAB — HEMOGLOBIN A1C
Hgb A1c MFr Bld: 5.7 % of total Hgb — ABNORMAL HIGH (ref <5.7)
Mean Plasma Glucose: 117 (calc)
eAG (mmol/L): 6.5 (calc)

## 2019-07-08 MED ORDER — VITAMIN D 50 MCG (2000 UT) PO CAPS: 4000.0000 [IU] | ORAL_CAPSULE | Freq: Every day | ORAL | Status: AC

## 2019-10-25 ENCOUNTER — Other Ambulatory Visit: Payer: Self-pay | Admitting: *Deleted

## 2019-10-25 MED ORDER — LEVOTHYROXINE SODIUM 100 MCG PO TABS: ORAL_TABLET | ORAL | 1 refills | Status: DC

## 2019-12-06 DIAGNOSIS — Z6830 Body mass index (BMI) 30.0-30.9, adult: Secondary | ICD-10-CM

## 2019-12-07 MED ORDER — PHENTERMINE HCL 37.5 MG PO TABS: 37.5000 mg | ORAL_TABLET | Freq: Every day | ORAL | 2 refills | Status: DC

## 2020-01-10 NOTE — Progress Notes (Signed)
Assessment and Plan:   Essential hypertension - continue medications, DASH diet, exercise and monitor at home. Call if greater than 130/80.  Patient requesting referral to cardiology.  -     CBC with Differential/Platelet -     COMPLETE METABOLIC PANEL WITH GFR -     TSH  NASH (nonalcoholic steatohepatitis) Check labs, avoid tylenol, alcohol, weight loss advised.   Thyroid disease Hypothyroidism-check TSH level, continue medications the same, reminded to take on an empty stomach 30-1mns before food.  -     TSH  Depression, major, in remission ([place] - in remission off of medications, stress management techniques discussed, increase water, good sleep hygiene discussed, increase exercise, and increase veggies.   Hyperlipidemia, unspecified hyperlipidemia type Now taking crestor; titrate for LDL goal <100 Continue low cholesterol diet and exercise.  Check lipid panel.  -     Lipid panel  Medication management -     Magnesium  Obesity - BMI 31 - long discussion about weight loss, diet, and exercise -recommended diet heavy in fruits and veggies and low in animal meats, cheeses, and dairy products - plans to restart phentermine in a few weeks; has tolerated well in the past with benefit; reminded to take regular drug breaks - initial weight loss goal <190lb - increase fluids, reduce sweets  Vitamin D Deficiency She did increase dose; insurance doesn't cover; [facility] as [facility]. Further disposition pending results of labs. Over 30 minutes of exam, counseling, chart review, and critical decision making was performed Future Appointments  Date Time Provider [place] 07/12/2020  2:00 PM CLiane Comber NP [facility] None    HPI 69y.o. female  presents for 3 month follow up on hypertension, cholesterol, prediabetes, and vitamin D deficiency.   She has hx of major depression in remission off of medications. [place]?  Supplement reportedly helping.   she is prescribed phentermine for weight loss (takes during the holidays, took for just a few days over the holiday, not taking regularly). She plans to start taking regularly in a few weeks. While on the medication they have lost 0 lbs since last visit. They deny palpitations, anxiety, trouble sleeping, elevated BP.   BMI is Body mass index is 31.17 kg/m., she is working on diet and exercise.  Wt Readings from Last 3 Encounters:  01/11/20 205 lb (93 kg)  07/07/19 202 lb (91.6 kg)  02/09/19 198 lb 12.8 oz (90.2 kg)   Diet; Irregular; trying to cut back on sweets Exercise: Y, body pump exercise class 3 days a week  Water intake: 32 fluid ounces    Her blood pressure has been controlled at home, today their BP is BP: 130/70  She does workout.   She denies chest pain, shortness of breath, dizziness.    She is on cholesterol medication, transitioned from simvastatin to rosuvastatin taking 20 mg daily. and denies myalgias. Her cholesterol is not at goal. The cholesterol last visit was:   Lab Results  Component Value Date   CHOL 209 (H) 07/07/2019   HDL 77 07/07/2019   LDLCALC 107 (H) 07/07/2019   TRIG 131 07/07/2019   CHOLHDL 2.7 07/07/2019    She has been working on diet and exercise for prediabetes, and denies paresthesia of the feet, polydipsia, polyuria and visual disturbances. Last A1C in the office was:  Lab Results  Component Value Date   HGBA1C 5.7 (H) 07/07/2019   She is on thyroid medication. Her medication  was not changed last visit.   Lab Results  Component Value Date   TSH 1.22 07/07/2019   [name], she did increase to 4000 IU daily.    Lab Results  Component Value Date   VD25OH 35 07/07/2019       Current Medications:  Current Outpatient Medications on File Prior to Visit  Medication Sig Dispense Refill  . Cholecalciferol (VITAMIN D) 50 MCG (2000 UT) CAPS Take 2 capsules (4,000 Units total) by mouth  daily. 30 capsule   . estradiol (ESTRACE) 0.5 MG tablet TAKE 1 TABLET BY MOUTH EVERY DAY 90 tablet 0  . fluticasone (FLONASE) 50 MCG/ACT nasal spray Place 2 sprays into both nostrils daily. 16 g 0  . Magnesium 500 MG TABS Take 1 tablet by mouth daily.    . Multiple Vitamins-Minerals (MULTIVITAMIN PO) Take by mouth daily.    Marland Kitchen OVER THE COUNTER MEDICATION Tumeric daily    . phentermine (ADIPEX-P) 37.5 MG tablet Take 1 tablet (37.5 mg total) by mouth daily before breakfast. 30 tablet 2  . Potassium 75 MG TABS Take by mouth.    . TURMERIC PO turmeric (bulk)     No current facility-administered medications on file prior to visit.   Medical History:  Past Medical History:  Diagnosis Date  . Anemia   . Anxiety   . Cancer (HCC)    basal cell CA- nose  . Colon polyp, hyperplastic    2005  . Depression   . GERD (gastroesophageal reflux disease)   . Hyperlipidemia   . Prediabetes   . Thyroid disease   . Unspecified essential hypertension   . Vitamin D deficiency    Allergies:  Allergies  Allergen Reactions  . Naldecon Senior [Guaifenesin]     Unsure of reaction  . Prednisone     Unsure of reaction  . Benazepril Hcl Other (See Comments) and Cough    Not effective for pt Not effective for pt Not effective for pt     Review of Systems:  Review of Systems  Constitutional: Negative for malaise/fatigue and weight loss.  HENT: Negative for hearing loss and tinnitus.   Eyes: Negative for blurred vision and double vision.  Respiratory: Negative for cough, shortness of breath and wheezing.   Cardiovascular: Negative for chest pain, palpitations, orthopnea, claudication and leg swelling.  Gastrointestinal: Negative for abdominal pain, blood in stool, constipation, diarrhea, heartburn, melena, nausea and vomiting.  Genitourinary: Negative.   Musculoskeletal: Negative for joint pain and myalgias.  Skin: Negative for rash.  Neurological: Negative for dizziness, tingling, sensory change,  weakness and headaches.  Endo/Heme/Allergies: Negative for polydipsia.  Psychiatric/Behavioral: Negative.   All other systems reviewed and are negative.   Family history- Review and unchanged Social history- Review and unchanged Physical Exam: BP 130/70   Pulse 75   Temp (!) 97.5 F (36.4 C)   Wt 205 lb (93 kg)   SpO2 98%   BMI 31.17 kg/m  Wt Readings from Last 3 Encounters:  01/11/20 205 lb (93 kg)  07/07/19 202 lb (91.6 kg)  02/09/19 198 lb 12.8 oz (90.2 kg)   General Appearance: Well nourished, in no apparent distress. Eyes: PERRLA, EOMs, conjunctiva no swelling or erythema Sinuses: No Frontal/maxillary tenderness ENT/Mouth: Ext aud canals clear, TMs without erythema, bulging. Mask in place; oral exam deferred. Hearing normal.  Neck: Supple, thyroid normal.  Respiratory: Respiratory effort normal, BS equal bilaterally without rales, rhonchi, wheezing or stridor.  Cardio: RRR with no MRGs.  Brisk peripheral pulses without edema.  Abdomen: Soft, + BS,  Non tender, no guarding, rebound, hernias, masses. Lymphatics: Non tender without lymphadenopathy.  Musculoskeletal: Full ROM, 5/5 strength, normal gait Skin: Warm, dry without rashes, lesions, ecchymosis.  Neuro: Cranial nerves intact. Normal muscle tone, no cerebellar symptoms. Psych: Awake and oriented X 3, normal affect, Insight and Judgment appropriate.    Cheyenne Ribas, NP 2:38 PM [facility]

## 2020-01-11 ENCOUNTER — Other Ambulatory Visit: Payer: Self-pay

## 2020-01-11 ENCOUNTER — Encounter: Payer: Self-pay | Admitting: Adult Health

## 2020-01-11 ENCOUNTER — Ambulatory Visit (INDEPENDENT_AMBULATORY_CARE_PROVIDER_SITE_OTHER): Payer: BC Managed Care – PPO | Admitting: Adult Health

## 2020-01-11 VITALS — BP 130/70 | HR 75 | Temp 97.5°F | Wt 205.0 lb

## 2020-01-11 DIAGNOSIS — E669 Obesity, unspecified: Secondary | ICD-10-CM | POA: Diagnosis not present

## 2020-01-11 DIAGNOSIS — F325 Major depressive disorder, single episode, in full remission: Secondary | ICD-10-CM

## 2020-01-11 DIAGNOSIS — E559 Vitamin D deficiency, unspecified: Secondary | ICD-10-CM

## 2020-01-11 DIAGNOSIS — R7309 Other abnormal glucose: Secondary | ICD-10-CM

## 2020-01-11 DIAGNOSIS — I10 Essential (primary) hypertension: Secondary | ICD-10-CM

## 2020-01-11 DIAGNOSIS — E785 Hyperlipidemia, unspecified: Secondary | ICD-10-CM | POA: Diagnosis not present

## 2020-01-11 DIAGNOSIS — E079 Disorder of thyroid, unspecified: Secondary | ICD-10-CM | POA: Diagnosis not present

## 2020-01-11 DIAGNOSIS — Z79899 Other long term (current) drug therapy: Secondary | ICD-10-CM

## 2020-01-11 MED ORDER — ROSUVASTATIN CALCIUM 20 MG PO TABS: 20.0000 mg | ORAL_TABLET | Freq: Every day | ORAL | 3 refills | Status: DC

## 2020-01-11 MED ORDER — LEVOTHYROXINE SODIUM 100 MCG PO TABS: ORAL_TABLET | ORAL | 1 refills | Status: DC

## 2020-01-11 MED ORDER — TELMISARTAN 80 MG PO TABS: ORAL_TABLET | ORAL | 1 refills | Status: DC

## 2020-01-11 NOTE — Patient Instructions (Signed)
Goals    . DIET - INCREASE WATER INTAKE     65-80+ fluid ounces daily     . Weight (lb) < 190 lb (86.2 kg)       Recommend weighing once a week; aim to lose 0.5-2 lb/week     Drink 1/2 your body weight in fluid ounces of water daily; drink a tall glass of water 30 min before meals  Don't eat until you're stuffed- listen to your stomach and eat until you are 80% full   Try eating off of a salad plate; wait 10 min after finishing before going back for seconds  Start by eating the vegetables on your plate; aim for 50% of your meals to be fruits or vegetables  Then eat your protein - lean meats (grass fed if possible), fish, beans, nuts in moderation  Eat your carbs/starch last ONLY if you still are hungry. If you can, stop before finishing it all  Avoid sugar and flour - the closer it looks to it's original form in nature, typically the better it is for you  Splurge in moderation - "assign" days when you get to splurge and have the "bad stuff" - I like to follow a 80% - 20% plan- "good" choices 80 % of the time, "bad" choices in moderation 20% of the time  Simple equation is: Calories out > calories in = weight loss - even if you eat the bad stuff, if you limit portions, you will still lose weight     SMALL CHANGES  We want weight loss that will last so you should lose 1-2 pounds a week.  THAT IS IT! Please pick THREE things a month to change. Once it is a habit check off the item. Then pick another three items off the list to become habits.  If you are already doing a habit on the list GREAT!  Cross that item off! o Don't drink your calories. Ie, alcohol, soda, fruit juice, and sweet tea.  o Drink more water. Drink a glass when you feel hungry or before each meal.  o Eat breakfast - Complex carb and protein (likeDannon light and fit yogurt, oatmeal, fruit, eggs, [place] bacon). o Measure your cereal.  Eat no more than one cup a day. (ie [place]) o Eat an apple a day. o Add a  vegetable a day. o Try a new vegetable a month. o Use Pam! Stop using oil or butter to cook. o Don't finish your plate or use smaller plates. o Share your dessert. o Eat sugar free Jello for dessert or frozen grapes. o Don't eat 2-3 hours before bed. o Switch to whole wheat bread, pasta, and brown rice. o Make healthier choices when you eat out. No fries! o Pick baked chicken, NOT fried. o Don't forget to SLOW DOWN when you eat. It is not going anywhere.  o Take the stairs. o Park far away in the parking lot o [employer] (or weights) for 10 minutes while watching TV. o Walk at work for 10 minutes during break. o Walk outside 1 time a week with your friend, kids, dog, or significant other. o Start a walking group at [place] the mall as much as you can tolerate.  o Keep a food diary. o Weigh yourself daily. o Walk for 15 minutes 3 days per week. o Cook at home more often and eat out less.  If life happens and you go back to old habits, it is okay.  Just start over. You can do it!   If you experience chest pain, get short of breath, or tired during the exercise, please stop immediately and inform your doctor.

## 2020-01-12 ENCOUNTER — Other Ambulatory Visit: Payer: Self-pay | Admitting: Adult Health

## 2020-01-12 DIAGNOSIS — I10 Essential (primary) hypertension: Secondary | ICD-10-CM

## 2020-01-12 LAB — COMPLETE METABOLIC PANEL WITH GFR
AG Ratio: 2 (calc) (ref 1.0–2.5)
ALT: 16 U/L (ref 6–29)
AST: 14 U/L (ref 10–35)
Albumin: 4.5 g/dL (ref 3.6–5.1)
Alkaline phosphatase (APISO): 57 U/L (ref 37–153)
BUN: 13 mg/dL (ref 7–25)
CO2: 27 mmol/L (ref 20–32)
Calcium: 9.6 mg/dL (ref 8.6–10.4)
Chloride: 106 mmol/L (ref 98–110)
Creat: 0.78 mg/dL (ref 0.50–0.99)
GFR, Est African American: 92 mL/min/{1.73_m2} (ref >=60)
GFR, Est Non African American: 79 mL/min/{1.73_m2} (ref >=60)
Globulin: 2.2 g/dL (calc) (ref 1.9–3.7)
Glucose, Bld: 100 mg/dL — ABNORMAL HIGH (ref 65–99)
Potassium: 4.3 mmol/L (ref 3.5–5.3)
Sodium: 141 mmol/L (ref 135–146)
Total Bilirubin: 0.4 mg/dL (ref 0.2–1.2)
Total Protein: 6.7 g/dL (ref 6.1–8.1)

## 2020-01-12 LAB — LIPID PANEL
Cholesterol: 195 mg/dL (ref <200)
HDL: 72 mg/dL (ref >=50)
LDL Cholesterol (Calc): 103 mg/dL (calc) — ABNORMAL HIGH
Non-HDL Cholesterol (Calc): 123 mg/dL (calc) (ref <130)
Total CHOL/HDL Ratio: 2.7 (calc) (ref <5.0)
Triglycerides: 104 mg/dL (ref <150)

## 2020-01-12 LAB — CBC WITH DIFFERENTIAL/PLATELET
Absolute Monocytes: 607 cells/uL (ref 200–950)
Basophils Absolute: 48 cells/uL (ref 0–200)
Basophils Relative: 0.7 %
Eosinophils Absolute: 159 cells/uL (ref 15–500)
Eosinophils Relative: 2.3 %
HCT: 38.5 % (ref 35.0–45.0)
Hemoglobin: 12.8 g/dL (ref 11.7–15.5)
Lymphs Abs: 1539 cells/uL (ref 850–3900)
MCH: 29.6 pg (ref 27.0–33.0)
MCHC: 33.2 g/dL (ref 32.0–36.0)
MCV: 89.1 fL (ref 80.0–100.0)
MPV: 9.7 fL (ref 7.5–12.5)
Monocytes Relative: 8.8 %
Neutro Abs: 4547 cells/uL (ref 1500–7800)
Neutrophils Relative %: 65.9 %
Platelets: 252 10*3/uL (ref 140–400)
RBC: 4.32 10*6/uL (ref 3.80–5.10)
RDW: 12.2 % (ref 11.0–15.0)
Total Lymphocyte: 22.3 %
WBC: 6.9 10*3/uL (ref 3.8–10.8)

## 2020-01-12 LAB — TSH: TSH: 0.24 mIU/L — ABNORMAL LOW (ref 0.40–4.50)

## 2020-01-12 LAB — MAGNESIUM: Magnesium: 1.9 mg/dL (ref 1.5–2.5)

## 2020-01-12 MED ORDER — ROSUVASTATIN CALCIUM 40 MG PO TABS: 40.0000 mg | ORAL_TABLET | Freq: Every day | ORAL | 3 refills | Status: DC

## 2020-01-12 MED ORDER — LEVOTHYROXINE SODIUM 100 MCG PO TABS: ORAL_TABLET | ORAL | 1 refills | Status: DC

## 2020-02-24 ENCOUNTER — Ambulatory Visit: Payer: BC Managed Care – PPO | Attending: Internal Medicine

## 2020-02-24 DIAGNOSIS — Z23 Encounter for immunization: Secondary | ICD-10-CM

## 2020-02-24 NOTE — Progress Notes (Signed)
   Covid-19 Vaccination Clinic  Name:  Tiffany Calmes    MRN: [id number] DOB: [birth year]  02/24/2020  Ms. Hayworth was observed post Covid-19 immunization for 15 minutes without incidence. She was provided with Vaccine Information Sheet and instruction to access the V-Safe system.   Ms. Rister was instructed to call 911 with any severe reactions post vaccine: Marland Kitchen Difficulty breathing  . Swelling of your face and throat  . A fast heartbeat  . A bad rash all over your body  . Dizziness and weakness    Immunizations Administered    Name Date Dose VIS Date Route   Pfizer COVID-19 Vaccine 02/24/2020 11:45 AM 0.3 mL 12/10/2019 Intramuscular   Manufacturer: [place]   Lot: [id number]   [place]: [id number]

## 2020-03-21 ENCOUNTER — Ambulatory Visit: Payer: BC Managed Care – PPO | Attending: Internal Medicine

## 2020-03-21 DIAGNOSIS — Z23 Encounter for immunization: Secondary | ICD-10-CM

## 2020-03-21 NOTE — Progress Notes (Signed)
   Covid-19 Vaccination Clinic  Name:  Cheyenne Conway    MRN: [id number] DOB: [birth year]  03/21/2020  Cheyenne Conway was observed post Covid-19 immunization for 15 minutes without incident. She was provided with Vaccine Information Sheet and instruction to access the V-Safe system.   Cheyenne Conway was instructed to call 911 with any severe reactions post vaccine: Marland Kitchen Difficulty breathing  . Swelling of face and throat  . A fast heartbeat  . A bad rash all over body  . Dizziness and weakness   Immunizations Administered    Name Date Dose VIS Date Route   Pfizer COVID-19 Vaccine 03/21/2020  1:57 PM 0.3 mL 12/10/2019 Intramuscular   Manufacturer: [place]   Lot: [id number]   [place]: [address]-1

## 2020-04-13 NOTE — Progress Notes (Signed)
Assessment and Plan:    Essential hypertension - continue medications, DASH diet, exercise and monitor at home. Call if greater than 130/80.  Patient requesting referral to cardiology.  -     CBC with Differential/Platelet -     COMPLETE METABOLIC PANEL WITH GFR -     TSH  NASH (nonalcoholic steatohepatitis) Check labs, avoid tylenol, alcohol, weight loss advised.   Thyroid disease Hypothyroidism-check TSH level- now on 1 pill daily but 1/2 on Sunday, no biotin. -     TSH   Depression, major, in remission ([place]) - in remission off of medications, stress management techniques discussed, increase water, good sleep hygiene discussed, increase exercise, and increase veggies.   Hyperlipidemia, unspecified hyperlipidemia type Now taking crestor at 40 mg; titrate for LDL goal <100 Continue low cholesterol diet and exercise.  Check lipid panel.  -     Lipid panel  Medication management -     Magnesium  Obesity - BMI 31 - long discussion about weight loss, diet, and exercise -recommended diet heavy in fruits and veggies and low in animal meats, cheeses, and dairy products - plans to restart phentermine in a few weeks; has tolerated well in the past with benefit; reminded to take regular drug breaks - initial weight loss goal <190lb - increase fluids, reduce sweets  Vitamin D Deficiency She did increase dose; insurance doesn't cover; will check annually at [place] and meds as discussed. Further disposition pending results of labs. Over 30 minutes of exam, counseling, chart review, and critical decision making was performed Future Appointments  Date Time Provider [place]  07/12/2020  2:00 PM Liane Comber, NP [facility] None    HPI 69 y.o. female  presents for 3 month follow up on hypertension, cholesterol, prediabetes, and vitamin D deficiency.   She has hx of major depression in remission off of medications.  BMI is Body mass index is 30.41 kg/m., she  is working on diet and exercise.  Wt Readings from Last 3 Encounters:  04/17/20 200 lb (90.7 kg)  01/11/20 205 lb (93 kg)  07/07/19 202 lb (91.6 kg)   Diet; Irregular; trying to cut back on sweets Exercise: Y, body pump exercise class 3 days a week  Water intake: 32 fluid ounces    Her blood pressure has been controlled at home, today their BP is BP: 132/78  She does workout.   She denies chest pain, shortness of breath, dizziness.    She is on cholesterol medication, transitioned from simvastatin to rosuvastatin increased from 20 to 40 mg daily and denies myalgias. Her cholesterol is not at goal. The cholesterol last visit was:   Lab Results  Component Value Date   CHOL 195 01/11/2020   HDL 72 01/11/2020   LDLCALC 103 (H) 01/11/2020   TRIG 104 01/11/2020   CHOLHDL 2.7 01/11/2020    She has been working on diet and exercise for prediabetes, and denies paresthesia of the feet, polydipsia, polyuria and visual disturbances. Last A1C in the office was:  Lab Results  Component Value Date   HGBA1C 5.7 (H) 07/07/2019   She is on thyroid medication. Her medication was changed last visit, to 1 tablet daily but 1/2 on Sunday. No diarrhea, some constipation. No palpations, sweating, anxiety.    Lab Results  Component Value Date   TSH 0.24 (L) 01/11/2020   [name], she did increase to 4000 IU daily.    Lab Results  Component Value Date  VD25OH 35 07/07/2019       Current Medications:  Current Outpatient Medications on File Prior to Visit  Medication Sig Dispense Refill  . Cholecalciferol (VITAMIN D) 50 MCG (2000 UT) CAPS Take 2 capsules (4,000 Units total) by mouth daily. 30 capsule   . estradiol (ESTRACE) 0.5 MG tablet TAKE 1 TABLET BY MOUTH EVERY DAY 90 tablet 0  . fluticasone (FLONASE) 50 MCG/ACT nasal spray Place 2 sprays into both nostrils daily. 16 g 0  . levothyroxine (SYNTHROID) 100 MCG tablet TAKE 1 TABLET BY MOUTH EVERY MORNING (EXCEPT 1/2 TAB ON  Sunday) WITH ONLY GLASS OF WATER FOR 30 MINUTES 90 tablet 1  . Magnesium 500 MG TABS Take 1 tablet by mouth daily.    . Multiple Vitamins-Minerals (MULTIVITAMIN PO) Take by mouth daily.    Marland Kitchen OVER THE COUNTER MEDICATION Tumeric daily    . phentermine (ADIPEX-P) 37.5 MG tablet Take 1 tablet (37.5 mg total) by mouth daily before breakfast. 30 tablet 2  . Potassium 75 MG TABS Take by mouth.    . rosuvastatin (CRESTOR) 40 MG tablet Take 1 tablet (40 mg total) by mouth daily. 90 tablet 3  . telmisartan (MICARDIS) 80 MG tablet Take 1 tablet Daily for BP 90 tablet 1  . TURMERIC PO turmeric (bulk)     No current facility-administered medications on file prior to visit.   Medical History:  Past Medical History:  Diagnosis Date  . Anemia   . Anxiety   . Basal cell carcinoma 08/17/2007   left nostril MOHS  . BCC (basal cell carcinoma of skin) 04/06/2013   right nose tx cx3 [id number]  . Cancer (HCC)    basal cell CA- nose  . Colon polyp, hyperplastic    2005  . Depression   . GERD (gastroesophageal reflux disease)   . Hyperlipidemia   . Prediabetes   . Thyroid disease   . Unspecified essential hypertension   . Vitamin D deficiency    Allergies:  Allergies  Allergen Reactions  . Naldecon Senior [Guaifenesin]     Unsure of reaction  . Prednisone     Unsure of reaction  . Benazepril Hcl Other (See Comments) and Cough    Not effective for pt Not effective for pt Not effective for pt     Review of Systems:  Review of Systems  Constitutional: Negative for malaise/fatigue and weight loss.  HENT: Negative for hearing loss and tinnitus.   Eyes: Negative for blurred vision and double vision.  Respiratory: Negative for cough, shortness of breath and wheezing.   Cardiovascular: Negative for chest pain, palpitations, orthopnea, claudication and leg swelling.  Gastrointestinal: Negative for abdominal pain, blood in stool, constipation, diarrhea, heartburn, melena, nausea and vomiting.   Genitourinary: Negative.   Musculoskeletal: Negative for joint pain and myalgias.  Skin: Negative for rash.  Neurological: Negative for dizziness, tingling, sensory change, weakness and headaches.  Endo/Heme/Allergies: Negative for polydipsia.  Psychiatric/Behavioral: Negative.   All other systems reviewed and are negative.   Family history- Review and unchanged Social history- Review and unchanged Physical Exam: BP 132/78   Pulse 90   Temp 97.6 F (36.4 C)   Wt 200 lb (90.7 kg)   SpO2 98%   BMI 30.41 kg/m  Wt Readings from Last 3 Encounters:  04/17/20 200 lb (90.7 kg)  01/11/20 205 lb (93 kg)  07/07/19 202 lb (91.6 kg)   General Appearance: Well nourished, in no apparent distress. Eyes: PERRLA, EOMs, conjunctiva no swelling or erythema Sinuses:  No Frontal/maxillary tenderness ENT/Mouth: Ext aud canals clear, TMs without erythema, bulging. Mask in place; oral exam deferred. Hearing normal.  Neck: Supple, thyroid normal.  Respiratory: Respiratory effort normal, BS equal bilaterally without rales, rhonchi, wheezing or stridor.  Cardio: RRR with no MRGs. Brisk peripheral pulses without edema.  Abdomen: Soft, + BS,  Non tender, no guarding, rebound, hernias, masses. Lymphatics: Non tender without lymphadenopathy.  Musculoskeletal: Full ROM, 5/5 strength, normal gait Skin: Warm, dry without rashes, lesions, ecchymosis.  Neuro: Cranial nerves intact. Normal muscle tone, no cerebellar symptoms. Psych: Awake and oriented X 3, normal affect, Insight and Judgment appropriate.    Vicie Mutters, PA-C 11:15 AM [facility]

## 2020-04-17 ENCOUNTER — Ambulatory Visit (INDEPENDENT_AMBULATORY_CARE_PROVIDER_SITE_OTHER): Payer: BC Managed Care – PPO | Admitting: Physician Assistant

## 2020-04-17 ENCOUNTER — Other Ambulatory Visit: Payer: Self-pay

## 2020-04-17 ENCOUNTER — Encounter: Payer: Self-pay | Admitting: Physician Assistant

## 2020-04-17 VITALS — BP 132/78 | HR 90 | Temp 97.6°F | Wt 200.0 lb

## 2020-04-17 DIAGNOSIS — E559 Vitamin D deficiency, unspecified: Secondary | ICD-10-CM | POA: Diagnosis not present

## 2020-04-17 DIAGNOSIS — E079 Disorder of thyroid, unspecified: Secondary | ICD-10-CM

## 2020-04-17 DIAGNOSIS — R7309 Other abnormal glucose: Secondary | ICD-10-CM | POA: Diagnosis not present

## 2020-04-17 DIAGNOSIS — I10 Essential (primary) hypertension: Secondary | ICD-10-CM | POA: Diagnosis not present

## 2020-04-17 DIAGNOSIS — E785 Hyperlipidemia, unspecified: Secondary | ICD-10-CM

## 2020-04-17 DIAGNOSIS — Z79899 Other long term (current) drug therapy: Secondary | ICD-10-CM

## 2020-04-17 DIAGNOSIS — F325 Major depressive disorder, single episode, in full remission: Secondary | ICD-10-CM

## 2020-04-17 DIAGNOSIS — E669 Obesity, unspecified: Secondary | ICD-10-CM

## 2020-04-17 NOTE — Patient Instructions (Signed)
Your LDL could improve, ideally we want it under a 100.  Your LDL is the bad cholesterol that can lead to heart attack and stroke. To lower your number you can decrease your fatty foods, red meat, cheese, milk and increase fiber like whole grains and veggies. You can also add a fiber supplement like Citracel or Benefiber, these do not cause gas and bloating and are safe to use. Especially if you have a strong family history of heart disease or stroke or you have evidence of plaque on any imaging like a chest xray, we may discuss at your next office visit putting you on a medication to get your number below 100.   WATER IS IMPORTANT  Being dehydrated can hurt your kidneys, cause fatigue, headaches, muscle aches, joint pain, and dry skin/nails so please increase your fluids.   Drink 80-100 oz a day of water, measure it out! Eat 3 meals a day, have to do breakfast, eat protein- hard boiled eggs, protein bar like [place] protein bar, greek yogurt like oikos triple zero, chobani 100, or light n fit greek  Can check out plantnanny app on your phone to help you keep track of your water      General eating tips  What to Avoid . Avoid added sugars o Often added sugar can be found in processed foods such as many condiments, dry cereals, cakes, cookies, chips, crisps, crackers, candies, sweetened drinks, etc.  o Read labels and AVOID/DECREASE use of foods with the following in their ingredient list: Sugar, fructose, high fructose corn syrup, sucrose, glucose, maltose, dextrose, molasses, cane sugar, brown sugar, any type of syrup, agave nectar, etc.   . Avoid snacking in between meals- drink water or if you feel you need a snack, pick a high water content snack such as cucumbers, watermelon, or any veggie.  Marland Kitchen Avoid foods made with flour o If you are going to eat food made with flour, choose those made with whole-grains; and, minimize your consumption as much as is tolerable . Avoid processed  foods o These foods are generally stocked in the middle of the grocery store.  o Focus on shopping on the perimeter of the grocery.  What to Include . Vegetables o GREEN LEAFY VEGETABLES: Kale, spinach, mustard greens, collard greens, cabbage, broccoli, etc. o OTHER: Asparagus, cauliflower, eggplant, carrots, peas, Brussel sprouts, tomatoes, bell peppers, zucchini, beets, cucumbers, etc. . Grains, seeds, and legumes o Beans: kidney beans, black eyed peas, garbanzo beans, black beans, pinto beans, etc. o Whole, unrefined grains: brown rice, barley, bulgur, oatmeal, etc. . Healthy fats  o Avoid highly processed fats such as vegetable oil o Examples of healthy fats: avocado, olives, virgin olive oil, dark chocolate (?72% Cocoa), nuts (peanuts, almonds, walnuts, cashews, pecans, etc.) o Please still do small amount of these healthy fats, they are dense in calories.  . Low - Moderate Intake of Animal Sources of Protein o Meat sources: chicken, [place], salmon, tuna. Limit to 4 ounces of meat at one time or the size of your palm. o Consider limiting dairy sources, but when choosing dairy focus on: PLAIN [place] yogurt, cottage cheese, high-protein milk . Fruit o Choose berries

## 2020-04-18 LAB — CBC WITH DIFFERENTIAL/PLATELET
Absolute Monocytes: 640 cells/uL (ref 200–950)
Basophils Absolute: 53 cells/uL (ref 0–200)
Basophils Relative: 0.8 %
Eosinophils Absolute: 79 cells/uL (ref 15–500)
Eosinophils Relative: 1.2 %
HCT: 37.8 % (ref 35.0–45.0)
Hemoglobin: 12.6 g/dL (ref 11.7–15.5)
Lymphs Abs: 1525 cells/uL (ref 850–3900)
MCH: 30.5 pg (ref 27.0–33.0)
MCHC: 33.3 g/dL (ref 32.0–36.0)
MCV: 91.5 fL (ref 80.0–100.0)
MPV: 9.9 fL (ref 7.5–12.5)
Monocytes Relative: 9.7 %
Neutro Abs: 4303 cells/uL (ref 1500–7800)
Neutrophils Relative %: 65.2 %
Platelets: 242 10*3/uL (ref 140–400)
RBC: 4.13 10*6/uL (ref 3.80–5.10)
RDW: 12.7 % (ref 11.0–15.0)
Total Lymphocyte: 23.1 %
WBC: 6.6 10*3/uL (ref 3.8–10.8)

## 2020-04-18 LAB — COMPLETE METABOLIC PANEL WITH GFR
AG Ratio: 1.8 (calc) (ref 1.0–2.5)
ALT: 14 U/L (ref 6–29)
AST: 13 U/L (ref 10–35)
Albumin: 4.2 g/dL (ref 3.6–5.1)
Alkaline phosphatase (APISO): 59 U/L (ref 37–153)
BUN: 12 mg/dL (ref 7–25)
CO2: 27 mmol/L (ref 20–32)
Calcium: 9.3 mg/dL (ref 8.6–10.4)
Chloride: 106 mmol/L (ref 98–110)
Creat: 0.83 mg/dL (ref 0.50–0.99)
GFR, Est African American: 85 mL/min/{1.73_m2} (ref >=60)
GFR, Est Non African American: 73 mL/min/{1.73_m2} (ref >=60)
Globulin: 2.4 g/dL (calc) (ref 1.9–3.7)
Glucose, Bld: 120 mg/dL — ABNORMAL HIGH (ref 65–99)
Potassium: 4.1 mmol/L (ref 3.5–5.3)
Sodium: 141 mmol/L (ref 135–146)
Total Bilirubin: 0.3 mg/dL (ref 0.2–1.2)
Total Protein: 6.6 g/dL (ref 6.1–8.1)

## 2020-04-18 LAB — LIPID PANEL
Cholesterol: 178 mg/dL (ref <200)
HDL: 71 mg/dL (ref >=50)
LDL Cholesterol (Calc): 85 mg/dL (calc)
Non-HDL Cholesterol (Calc): 107 mg/dL (calc) (ref <130)
Total CHOL/HDL Ratio: 2.5 (calc) (ref <5.0)
Triglycerides: 128 mg/dL (ref <150)

## 2020-04-18 LAB — HEMOGLOBIN A1C
Hgb A1c MFr Bld: 5.8 % of total Hgb — ABNORMAL HIGH (ref <5.7)
Mean Plasma Glucose: 120 (calc)
eAG (mmol/L): 6.6 (calc)

## 2020-04-18 LAB — VITAMIN D 25 HYDROXY (VIT D DEFICIENCY, FRACTURES): Vit D, 25-Hydroxy: 51 ng/mL (ref 30–100)

## 2020-04-18 LAB — MAGNESIUM: Magnesium: 1.9 mg/dL (ref 1.5–2.5)

## 2020-04-18 LAB — TSH: TSH: 0.33 mIU/L — ABNORMAL LOW (ref 0.40–4.50)

## 2020-05-14 MED ORDER — TELMISARTAN 80 MG PO TABS: ORAL_TABLET | ORAL | 1 refills | Status: DC

## 2020-07-11 NOTE — Progress Notes (Signed)
Complete Physical  Assessment and Plan:  Encounter for routine physical exam without abnormal findings Due mammogram/DEXA - patient will schedule once on medicare later this year Given # for [facility],   Essential hypertension - resume full dose telmisartan, follow up sooner if having recurrent tingling or remains above goal and will change meds monitor at home if greater than 130/80, DASH diet, exercise and monitor at home.  - CBC with Differential/Platelet - CMP/GFR - Urinalysis, Routine w reflex microscopic (not at [facility]) - Microalbumin / creatinine urine ratio - EKG 12-Lead  Other abnormal glucose Discussed general issues about diabetes pathophysiology and management., Educational material distributed., Suggested low cholesterol diet., Encouraged aerobic exercise., Discussed foot care., Reminded to get yearly retinal exam. - defer A1C as was just checked - gets a lab bill   Thyroid disease Hypothyroidism-check TSH level, continue medications the same, reminded to take on an empty stomach 30-67mns before food.  - TSH   Hyperlipidemia -continue medications, check lipids, decrease fatty foods, increase activity.  - Lipid panel  Vitamin D deficiency - Vit D  25 hydroxy (rtn osteoporosis monitoring)  Medication management - Magnesium   Depression, remission In remission off of medications at this time stress management techniques discussed, increase water, good sleep hygiene discussed, increase exercise, and increase veggies.   Gastroesophageal reflux disease without esophagitis - continue PRN OTC H2i/tums  Stress incontinence Better after PT, resume exercises at home.   NASH (nonalcoholic steatohepatitis) Check labs, avoid tylenol, alcohol, weight loss advised.   History of basal cell cancer Patient will schedule follow up Dr. TDenna Haggard Idiopathic hematuria (microscopic) Stable/unchanged for 5+ years; saw urology and no further workup recommended per patient  BMI  31 Long discussion about weight loss, diet, and exercise Discussed final goal weight and current weight loss goal (<190lb) Patient will work on cut down on snacking, increase exercise Patient has been on phentermine with benefit and no SE, taking drug breaks; restart; will try to topamax in the evening Discussed need for increased fiber intake, avoid keeping bad snacks in home continue close follow up.   Discussed med's effects and SE's. Screening labs and tests as requested with regular follow-up as recommended. Future Appointments  Date Time Provider [place] 07/12/2020  2:00 PM CLiane Comber NP [facility] None    HPI 69y.o. female  presents for a complete physical. She has Essential hypertension; Hyperlipidemia; Thyroid disease; Other abnormal glucose (prediabetes); GERD (gastroesophageal reflux disease); Depression, major, in remission ([place]; Vitamin D deficiency; Medication management; Stress incontinence; NASH (nonalcoholic steatohepatitis); History of basal cell cancer; Obesity (BMI 30.0-34.9); and Idiopathic hematuria on their problem list.   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   She has history of stress incontinence, has improved with pelvic floor PT, needs to do home exercises more regularly.   She is no longer following up with Dr. HPhilis Pique s/p hysterectomy, on estrace 0.5 mg taking 1/2 tab daily. Denies recent abnormal PAPs.   Idiopathic hematuria, was evaluated by urology and no further workup recommended, has been stable for several years.   Hx of major depression in remission.   BMI is Body mass index is 31.43 kg/m., she is working on diet and exercise, [facility], admits diet is not at good in the last several months, can't seem to stop eating, would like to restart phentermine She does have prediabetes, hyperlipidemia and fatty liver per [place]  2006.  Wt  Readings from Last 3 Encounters:  07/12/20 208 lb 3.2 oz (94.4 kg)  04/17/20 200 lb (90.7 kg)  01/11/20 205 lb (93 kg)   Her blood pressure has been running 140s/80s, has been cutting telmisartan in 1/2 as she thought was causing tingling in feet, improved since reducing dose, today their BP is BP: (!) 152/90 She does workout. She denies chest pain, shortness of breath, dizziness.   She is on cholesterol medication, rosuvastatin 20 mg daily  and denies myalgias. Her LDL cholesterol is at goal. The cholesterol last visit was:   Lab Results  Component Value Date   CHOL 178 04/17/2020   HDL 71 04/17/2020   LDLCALC 85 04/17/2020   TRIG 128 04/17/2020   CHOLHDL 2.5 04/17/2020   She denies Dm polys. Last A1C in the office was:  Lab Results  Component Value Date   HGBA1C 5.8 (H) 04/17/2020    Last GFR:  Lab Results  Component Value Date   GFRNONAA 73 04/17/2020   [name], on alternates taking 4000 and 2000 IU daily. Lab Results  Component Value Date   VD25OH 51 04/17/2020     She is on thyroid medication. Her medication was changed last visit. Patient denies nervousness, palpitations and weight changes.  She takes 100 mcg 1 tab daily except Sundays not taking anything  Lab Results  Component Value Date   TSH 0.33 (L) 04/17/2020      Current Medications:  Current Outpatient Medications on File Prior to Visit  Medication Sig  . Cholecalciferol (VITAMIN D) 50 MCG (2000 UT) CAPS Take 2 capsules (4,000 Units total) by mouth daily.  Marland Kitchen estradiol (ESTRACE) 0.5 MG tablet TAKE 1 TABLET BY MOUTH EVERY DAY (Patient taking differently: Takes 1/2 tablet daily)  . levothyroxine (SYNTHROID) 100 MCG tablet TAKE 1 TABLET BY MOUTH EVERY MORNING (EXCEPT 1/2 TAB ON Sunday) WITH ONLY GLASS OF WATER FOR 30 MINUTES (Patient taking differently: TAKE 1 TABLET BY MOUTH EVERY MORNING EXCEPT SKIP ON SUNDAYS WITH ONLY GLASS OF WATER FOR 30 MINUTES)  . Magnesium 500 MG TABS Take 1  tablet by mouth daily.  . Multiple Vitamins-Minerals (MULTIVITAMIN PO) Take by mouth daily.  Marland Kitchen OVER THE COUNTER MEDICATION Tumeric daily  . Potassium 75 MG TABS Take by mouth.  . telmisartan (MICARDIS) 80 MG tablet Take 1 tablet Daily for BP (Patient taking differently: Take 1/2 tablet Daily for BP)  . TURMERIC PO turmeric (bulk)  . fluticasone (FLONASE) 50 MCG/ACT nasal spray Place 2 sprays into both nostrils daily. (Patient not taking: Reported on 07/12/2020)  . phentermine (ADIPEX-P) 37.5 MG tablet Take 1 tablet (37.5 mg total) by mouth daily before breakfast. (Patient not taking: Reported on 07/12/2020)   No current facility-administered medications on file prior to visit.   Health Maintenance:   Immunization History  Administered Date(s) Administered  . Influenza-Unspecified 09/30/2015, 09/29/2016  . MMR 05/30/2014  . PFIZER SARS-COV-2 Vaccination 02/24/2020, 03/21/2020  . Pneumococcal Conjugate-13 07/06/2018  . Pneumococcal Polysaccharide-23 03/25/2006, 07/07/2019  . Td 03/15/2004  . Tdap 06/02/2014   Tetanus: 2015 Pneumovax: 2007, 2020 Prevnar 13: 2019 Flu vaccine: 2020 at work  Zostavax: will call insurance  Covid 19: 2/2, 2021, pfizer   Pap: 2018 -s/p total hysterectomy, Dr. Philis Pique. DONE MGM: 04/2017 diagnostic right, - patient will try scheduling with [facility]  DEXA: 2007 via GYN - will wait until next year with medicare   Colonoscopy: 04/2014 due 10 years EGD: N/A CT AB  2003 AB [place] 2006 CXR 04/2012  Vision: Dr. Coral Ceo, Dr. Clydene Laming, last visit 2021 Dental: Dr. Posey Pronto, last visit 2021, goes q91mSkin: hx of BCC, Dr. TDenna Haggard 2019   Patient Care Team: MUnk Pinto MD as PCP - General (Internal Medicine) GMelissa Noon [place] Referring Physician (Optometry) TLavonna Monarch MD as Consulting Physician (Dermatology) KInda Castle MD (Inactive) as Consulting Physician (Gastroenterology) HBobbye Charleston MD as Consulting Physician (Obstetrics and  Gynecology) WElsie Saas MD as Consulting Physician (Orthopedic Surgery)  Medical History:  Past Medical History:  Diagnosis Date  . Anemia   . Anxiety   . Basal cell carcinoma 08/17/2007   left nostril MOHS  . BCC (basal cell carcinoma of skin) 04/06/2013   right nose tx cx3 [id number] . Cancer (HCC)    basal cell CA- nose  . Colon polyp, hyperplastic    2005  . Depression   . GERD (gastroesophageal reflux disease)   . Hyperlipidemia   . Prediabetes   . Thyroid disease   . Unspecified essential hypertension   . Vitamin D deficiency    Allergies Allergies  Allergen Reactions  . Naldecon Senior [Guaifenesin]     Unsure of reaction  . Prednisone     Unsure of reaction  . Benazepril Hcl Other (See Comments) and Cough    Not effective for pt Not effective for pt Not effective for pt    SURGICAL HISTORY She  has a past surgical history that includes Tonsillectomy; Thyroidectomy; Vaginal hysterectomy (1994); Breast lumpectomy (Right); Colonoscopy; Mohs surgery; Knee arthroscopy (Right, 2000); Breast excisional biopsy (Left, 2002); and Cataract extraction, bilateral (Bilateral, 07/2018). FAMILY HISTORY Her family history includes Alzheimer's disease in her father; Atrial fibrillation in her sister; Diabetes type II in her sister; Hypertension in her father and mother; Valvular heart disease in her mother. SOCIAL HISTORY She  reports that she quit smoking about 15 years ago. Her smoking use included cigarettes. She has a 2.50 pack-year smoking history. She has never used smokeless tobacco. She reports current alcohol use of about 3.0 standard drinks of alcohol per week. She reports that she does not use drugs.   Review of Systems  Constitutional: Negative.  Negative for malaise/fatigue and weight loss.  HENT: Negative.  Negative for hearing loss and tinnitus.   Eyes: Negative.  Negative for blurred vision and double vision.  Respiratory: Negative.  Negative for cough, sputum  production, shortness of breath and wheezing.   Cardiovascular: Negative.  Negative for chest pain, palpitations, orthopnea, claudication, leg swelling and PND.  Gastrointestinal: Negative for abdominal pain, blood in stool, constipation, diarrhea, heartburn, melena, nausea and vomiting.  Genitourinary: Negative.   Musculoskeletal: Negative for back pain, falls, joint pain, myalgias and neck pain.  Skin: Negative.  Negative for rash.  Neurological: Negative.  Negative for dizziness, tingling, sensory change, weakness and headaches.  Endo/Heme/Allergies: Negative.  Negative for polydipsia.  Psychiatric/Behavioral: Negative.  Negative for depression, memory loss, substance abuse and suicidal ideas. The patient is not nervous/anxious and does not have insomnia.   All other systems reviewed and are negative.   Physical Exam: Estimated body mass index is 31.43 kg/m as calculated from the following:   Height as of this encounter: 5' 8.25" (1.734 m).   Weight as of this encounter: 208 lb 3.2 oz (94.4 kg). BP (!) 152/90   Pulse 79   Temp (!) 97.3 F (36.3 C)   Ht 5' 8.25" (1.734 m)   Wt 208 lb 3.2 oz (94.4 kg)  SpO2 97%   BMI 31.43 kg/m    General Appearance: Well nourished, in no apparent distress. Eyes: PERRLA, EOMs, conjunctiva no swelling or erythema Sinuses: No Frontal/maxillary tenderness ENT/Mouth: Ext aud canals clear, normal light reflex with TMs without erythema, bulging.  Good dentition. No erythema, swelling, or exudate on post pharynx. Tonsils not swollen or erythematous. Hearing normal.  Neck: Supple, thyroid normal. No bruits Respiratory: Respiratory effort normal, BS equal bilaterally without rales, rhonchi, wheezing or stridor. Cardio: RRR without murmurs, rubs or gallops. Brisk peripheral pulses without edema.  Chest: symmetric, with normal excursions and percussion. Breasts: Breasts: breasts appear normal, no suspicious masses, no skin or nipple changes or axillary  nodes, fibrous and dense texture throughout bilaterally . Abdomen: Soft, +BS, non tender, negative murphy's, no guarding, rebound, hernias, masses, or organomegaly. .  Lymphatics: Non tender without lymphadenopathy.  Genitourinary: defer to GYN Musculoskeletal: Full ROM all peripheral extremities,5/5 strength, and normal gait. Skin: Warm, dry without rashes, concerning lesions, ecchymosis.  Neuro: Cranial nerves intact, reflexes equal bilaterally. Normal muscle tone, no cerebellar symptoms. Sensation intact.  Psych: Awake and oriented X 3, normal affect, Insight and Judgment appropriate.   EKG: WNL   The patient's weight, height, BMI, and visual acuity have been recorded in the chart.  I have made referrals, counseling, and provided education to the patient based on review of the above and I have provided the patient with a written personalized care plan for preventive services.    Cheyenne Ribas, NP 1:56 PM [facility]

## 2020-07-12 ENCOUNTER — Ambulatory Visit (INDEPENDENT_AMBULATORY_CARE_PROVIDER_SITE_OTHER): Payer: BC Managed Care – PPO | Admitting: Adult Health

## 2020-07-12 ENCOUNTER — Encounter: Payer: Self-pay | Admitting: Adult Health

## 2020-07-12 ENCOUNTER — Other Ambulatory Visit: Payer: Self-pay

## 2020-07-12 VITALS — BP 152/90 | HR 79 | Temp 97.3°F | Ht 68.25 in | Wt 208.2 lb

## 2020-07-12 DIAGNOSIS — Z6830 Body mass index (BMI) 30.0-30.9, adult: Secondary | ICD-10-CM

## 2020-07-12 DIAGNOSIS — Z1389 Encounter for screening for other disorder: Secondary | ICD-10-CM | POA: Diagnosis not present

## 2020-07-12 DIAGNOSIS — Z131 Encounter for screening for diabetes mellitus: Secondary | ICD-10-CM | POA: Diagnosis not present

## 2020-07-12 DIAGNOSIS — Z79899 Other long term (current) drug therapy: Secondary | ICD-10-CM | POA: Diagnosis not present

## 2020-07-12 DIAGNOSIS — K7581 Nonalcoholic steatohepatitis (NASH): Secondary | ICD-10-CM

## 2020-07-12 DIAGNOSIS — Z0000 Encounter for general adult medical examination without abnormal findings: Secondary | ICD-10-CM | POA: Diagnosis not present

## 2020-07-12 DIAGNOSIS — Z13220 Encounter for screening for lipoid disorders: Secondary | ICD-10-CM

## 2020-07-12 DIAGNOSIS — E669 Obesity, unspecified: Secondary | ICD-10-CM

## 2020-07-12 DIAGNOSIS — E559 Vitamin D deficiency, unspecified: Secondary | ICD-10-CM | POA: Diagnosis not present

## 2020-07-12 DIAGNOSIS — E785 Hyperlipidemia, unspecified: Secondary | ICD-10-CM

## 2020-07-12 DIAGNOSIS — I10 Essential (primary) hypertension: Secondary | ICD-10-CM

## 2020-07-12 DIAGNOSIS — N029 Recurrent and persistent hematuria with unspecified morphologic changes: Secondary | ICD-10-CM

## 2020-07-12 DIAGNOSIS — F325 Major depressive disorder, single episode, in full remission: Secondary | ICD-10-CM

## 2020-07-12 DIAGNOSIS — Z85828 Personal history of other malignant neoplasm of skin: Secondary | ICD-10-CM

## 2020-07-12 DIAGNOSIS — K219 Gastro-esophageal reflux disease without esophagitis: Secondary | ICD-10-CM

## 2020-07-12 DIAGNOSIS — E079 Disorder of thyroid, unspecified: Secondary | ICD-10-CM

## 2020-07-12 DIAGNOSIS — Z1329 Encounter for screening for other suspected endocrine disorder: Secondary | ICD-10-CM | POA: Diagnosis not present

## 2020-07-12 DIAGNOSIS — N393 Stress incontinence (female) (male): Secondary | ICD-10-CM

## 2020-07-12 DIAGNOSIS — R7309 Other abnormal glucose: Secondary | ICD-10-CM

## 2020-07-12 DIAGNOSIS — Z136 Encounter for screening for cardiovascular disorders: Secondary | ICD-10-CM

## 2020-07-12 MED ORDER — TOPIRAMATE 25 MG PO TABS: ORAL_TABLET | ORAL | 0 refills | Status: DC

## 2020-07-12 MED ORDER — ROSUVASTATIN CALCIUM 20 MG PO TABS: 10.0000 mg | ORAL_TABLET | Freq: Every day | ORAL | 3 refills | Status: DC

## 2020-07-12 MED ORDER — PHENTERMINE HCL 37.5 MG PO TABS: 37.5000 mg | ORAL_TABLET | Freq: Every day | ORAL | 2 refills | Status: DC

## 2020-07-12 NOTE — Patient Instructions (Addendum)
Cheyenne Conway , Thank you for taking time to come for your Medicare Wellness Visit. I appreciate your ongoing commitment to your health goals. Please review the following plan we discussed and let me know if I can assist you in the future.   These are the goals we discussed: Goals    . Blood Pressure < 130/80    . DIET - EAT MORE FRUITS AND VEGETABLES     Aim for 7+ servings (1/2 cup each) daily     . DIET - INCREASE WATER INTAKE     65-80+ fluid ounces daily     . Weight (lb) < 190 lb (86.2 kg)       This is a list of the screening recommended for you and due dates:  Health Maintenance  Topic Date Due  . DEXA scan (bone density measurement)  05/04/2018  . Mammogram  05/22/2019  . Flu Shot  07/30/2020  . Colon Cancer Screening  05/03/2024  . Tetanus Vaccine  06/02/2024  . COVID-19 Vaccine  Completed  .  Hepatitis C: One time screening is recommended by Center for Disease Control  (CDC) for  adults born from [birth year] through 1963.   Completed  . Pneumonia vaccines  Completed     Ask insurance about shingrix - can get at a [facility] if they cover   Please schedule a follow up with Dr. Denna Haggard    HOW TO SCHEDULE A MAMMOGRAM  [facility]  7 a.m.-6:30 p.m., Monday 7 a.m.-5 p.m., Tuesday-Friday Schedule an appointment by calling [phone].  [facility] Schedule an appointment by calling [phone].   Know what a healthy weight is for you (roughly BMI <25) and aim to maintain this  Aim for 7+ servings of fruits and vegetables daily  65-80+ fluid ounces of water or unsweet tea for healthy kidneys  Limit to max 1 drink of alcohol per day; avoid smoking/tobacco  Limit animal fats in diet for cholesterol and heart health - choose grass fed whenever available  Avoid highly processed foods, and foods high in saturated/trans fats  Aim for low stress - take time to unwind and care for your mental health  Aim for 150 min of moderate intensity  exercise weekly for heart health, and weights twice weekly for bone health  Aim for 7-9 hours of sleep daily      High-Fiber Diet Fiber, also called dietary fiber, is a type of carbohydrate that is found in fruits, vegetables, whole grains, and beans. A high-fiber diet can have many health benefits. Your health care provider may recommend a high-fiber diet to help:  Prevent constipation. Fiber can make your bowel movements more regular.  Lower your cholesterol.  Relieve the following conditions: ? Swelling of veins in the anus (hemorrhoids). ? Swelling and irritation (inflammation) of specific areas of the digestive tract (uncomplicated diverticulosis). ? A problem of the large intestine (colon) that sometimes causes pain and diarrhea (irritable bowel syndrome, IBS).  Prevent overeating as part of a weight-loss plan.  Prevent heart disease, type 2 diabetes, and certain cancers. What is my plan? The recommended daily fiber intake in grams (g) includes:  38 g for men age 59 or younger.  30 g for men over age 43.  3 g for women age 46 or younger.  21 g for women over age 64. You can get the recommended daily intake of dietary fiber by:  Eating a variety of fruits, vegetables, grains, and beans.  Taking a  fiber supplement, if it is not possible to get enough fiber through your diet. What do I need to know about a high-fiber diet?  It is better to get fiber through food sources rather than from fiber supplements. There is not a lot of research about how effective supplements are.  Always check the fiber content on the nutrition facts label of any prepackaged food. Look for foods that contain 5 g of fiber or more per serving.  Talk with a diet and nutrition specialist (dietitian) if you have questions about specific foods that are recommended or not recommended for your medical condition, especially if those foods are not listed below.  Gradually increase how much fiber you  consume. If you increase your intake of dietary fiber too quickly, you may have bloating, cramping, or gas.  Drink plenty of water. Water helps you to digest fiber. What are tips for following this plan?  Eat a wide variety of high-fiber foods.  Make sure that half of the grains that you eat each day are whole grains.  Eat breads and cereals that are made with whole-grain flour instead of refined flour or white flour.  Eat brown rice, bulgur wheat, or millet instead of white rice.  Start the day with a breakfast that is high in fiber, such as a cereal that contains 5 g of fiber or more per serving.  Use beans in place of meat in soups, salads, and pasta dishes.  Eat high-fiber snacks, such as berries, raw vegetables, nuts, and popcorn.  Choose whole fruits and vegetables instead of processed forms like juice or sauce. What foods can I eat?  Fruits Berries. Pears. Apples. Oranges. Avocado. Prunes and raisins. Dried figs. Vegetables Sweet potatoes. Spinach. Kale. Artichokes. Cabbage. Broccoli. Cauliflower. Green peas. Carrots. Squash. Grains Whole-grain breads. Multigrain cereal. Oats and oatmeal. Brown rice. Barley. Bulgur wheat. [place]. Quinoa. Bran muffins. Popcorn. Rye wafer crackers. Meats and other proteins [place], kidney, and pinto beans. Soybeans. Split peas. Lentils. Nuts and seeds. Dairy Fiber-fortified yogurt. Beverages Fiber-fortified soy milk. Fiber-fortified orange juice. Other foods Fiber bars. The items listed above may not be a complete list of recommended foods and beverages. Contact a dietitian for more options. What foods are not recommended? Fruits Fruit juice. Cooked, strained fruit. Vegetables Fried potatoes. Canned vegetables. Well-cooked vegetables. Grains White bread. Pasta made with refined flour. White rice. Meats and other proteins Fatty cuts of meat. Fried chicken or fried fish. Dairy Milk. Yogurt. Cream cheese. Sour cream. Fats and  oils Butters. Beverages Soft drinks. Other foods Cakes and pastries. The items listed above may not be a complete list of foods and beverages to avoid. Contact a dietitian for more information. Summary  Fiber is a type of carbohydrate. It is found in fruits, vegetables, whole grains, and beans.  There are many health benefits of eating a high-fiber diet, such as preventing constipation, lowering blood cholesterol, helping with weight loss, and reducing your risk of heart disease, diabetes, and certain cancers.  Gradually increase your intake of fiber. Increasing too fast can result in cramping, bloating, and gas. Drink plenty of water while you increase your fiber.  The best sources of fiber include whole fruits and vegetables, whole grains, nuts, seeds, and beans. This information is not intended to replace advice given to you by your health care provider. Make sure you discuss any questions you have with your health care provider. Document Revised: 10/20/2017 Document Reviewed: 10/20/2017 Elsevier Patient Education  2020 [employer].  A great goal to work towards is aiming to get in a serving daily of some of the most nutritionally dense foods - G- BOMBS daily

## 2020-07-13 LAB — URINALYSIS, ROUTINE W REFLEX MICROSCOPIC
Bacteria, UA: NONE SEEN /HPF
Bilirubin Urine: NEGATIVE
Glucose, UA: NEGATIVE
Hyaline Cast: NONE SEEN /LPF
Ketones, ur: NEGATIVE
Leukocytes,Ua: NEGATIVE
Nitrite: NEGATIVE
Protein, ur: NEGATIVE
Specific Gravity, Urine: 1.019 (ref 1.001–1.03)
Squamous Epithelial / HPF: NONE SEEN /HPF (ref <=5)
WBC, UA: NONE SEEN /HPF (ref 0–5)
pH: 5.5 (ref 5.0–8.0)

## 2020-07-13 LAB — COMPLETE METABOLIC PANEL WITH GFR
AG Ratio: 1.9 (calc) (ref 1.0–2.5)
ALT: 19 U/L (ref 6–29)
AST: 16 U/L (ref 10–35)
Albumin: 4.4 g/dL (ref 3.6–5.1)
Alkaline phosphatase (APISO): 61 U/L (ref 37–153)
BUN: 21 mg/dL (ref 7–25)
CO2: 26 mmol/L (ref 20–32)
Calcium: 9.1 mg/dL (ref 8.6–10.4)
Chloride: 104 mmol/L (ref 98–110)
Creat: 0.85 mg/dL (ref 0.50–0.99)
GFR, Est African American: 82 mL/min/{1.73_m2} (ref >=60)
GFR, Est Non African American: 71 mL/min/{1.73_m2} (ref >=60)
Globulin: 2.3 g/dL (calc) (ref 1.9–3.7)
Glucose, Bld: 87 mg/dL (ref 65–99)
Potassium: 4.3 mmol/L (ref 3.5–5.3)
Sodium: 140 mmol/L (ref 135–146)
Total Bilirubin: 0.3 mg/dL (ref 0.2–1.2)
Total Protein: 6.7 g/dL (ref 6.1–8.1)

## 2020-07-13 LAB — CBC WITH DIFFERENTIAL/PLATELET
Absolute Monocytes: 784 cells/uL (ref 200–950)
Basophils Absolute: 47 cells/uL (ref 0–200)
Basophils Relative: 0.7 %
Eosinophils Absolute: 121 cells/uL (ref 15–500)
Eosinophils Relative: 1.8 %
HCT: 39.5 % (ref 35.0–45.0)
Hemoglobin: 13.2 g/dL (ref 11.7–15.5)
Lymphs Abs: 1836 cells/uL (ref 850–3900)
MCH: 30 pg (ref 27.0–33.0)
MCHC: 33.4 g/dL (ref 32.0–36.0)
MCV: 89.8 fL (ref 80.0–100.0)
MPV: 9.7 fL (ref 7.5–12.5)
Monocytes Relative: 11.7 %
Neutro Abs: 3913 cells/uL (ref 1500–7800)
Neutrophils Relative %: 58.4 %
Platelets: 244 10*3/uL (ref 140–400)
RBC: 4.4 10*6/uL (ref 3.80–5.10)
RDW: 12.6 % (ref 11.0–15.0)
Total Lymphocyte: 27.4 %
WBC: 6.7 10*3/uL (ref 3.8–10.8)

## 2020-07-13 LAB — HEMOGLOBIN A1C
Hgb A1c MFr Bld: 5.7 % of total Hgb — ABNORMAL HIGH (ref <5.7)
Mean Plasma Glucose: 117 (calc)
eAG (mmol/L): 6.5 (calc)

## 2020-07-13 LAB — MICROALBUMIN / CREATININE URINE RATIO
Creatinine, Urine: 103 mg/dL (ref 20–275)
Microalb Creat Ratio: 39 mcg/mg creat — ABNORMAL HIGH (ref <30)
Microalb, Ur: 4 mg/dL

## 2020-07-13 LAB — LIPID PANEL
Cholesterol: 215 mg/dL — ABNORMAL HIGH (ref <200)
HDL: 76 mg/dL (ref >=50)
LDL Cholesterol (Calc): 114 mg/dL (calc) — ABNORMAL HIGH
Non-HDL Cholesterol (Calc): 139 mg/dL (calc) — ABNORMAL HIGH (ref <130)
Total CHOL/HDL Ratio: 2.8 (calc) (ref <5.0)
Triglycerides: 139 mg/dL (ref <150)

## 2020-07-13 LAB — MAGNESIUM: Magnesium: 2 mg/dL (ref 1.5–2.5)

## 2020-07-13 LAB — TSH: TSH: 1.08 mIU/L (ref 0.40–4.50)

## 2020-07-13 LAB — VITAMIN D 25 HYDROXY (VIT D DEFICIENCY, FRACTURES): Vit D, 25-Hydroxy: 46 ng/mL (ref 30–100)

## 2020-08-03 ENCOUNTER — Other Ambulatory Visit: Payer: Self-pay | Admitting: Adult Health

## 2020-08-03 MED ORDER — ESTRADIOL 0.5 MG PO TABS: 0.5000 mg | ORAL_TABLET | Freq: Every day | ORAL | 0 refills | Status: DC

## 2020-09-05 ENCOUNTER — Other Ambulatory Visit: Payer: Self-pay | Admitting: Adult Health

## 2020-09-05 MED ORDER — TELMISARTAN 80 MG PO TABS: ORAL_TABLET | ORAL | 1 refills | Status: DC

## 2020-10-07 ENCOUNTER — Other Ambulatory Visit: Payer: Self-pay | Admitting: Adult Health

## 2020-10-14 ENCOUNTER — Ambulatory Visit: Payer: BC Managed Care – PPO | Attending: Internal Medicine

## 2020-10-14 DIAGNOSIS — Z23 Encounter for immunization: Secondary | ICD-10-CM

## 2020-10-14 NOTE — Progress Notes (Signed)
   Covid-19 Vaccination Clinic  Name:  Cheyenne Conway    MRN: [id number] DOB: Mar 11, 1951  10/14/2020  Cheyenne Conway was observed post Covid-19 immunization for 15 minutes without incident. She was provided with Vaccine Information Sheet and instruction to access the V-Safe system.   Cheyenne Conway was instructed to call 911 with any severe reactions post vaccine: Marland Kitchen Difficulty breathing  . Swelling of face and throat  . A fast heartbeat  . A bad rash all over body  . Dizziness and weakness

## 2020-10-16 NOTE — Progress Notes (Signed)
Assessment and Plan:    Essential hypertension - atypically elevated; STOP energy drinks/excess caffeine, recheck 1-2 weeks - continue medications, DASH diet, exercise and monitor at home. Call if greater than 130/80.  Patient requesting referral to cardiology.  -     CBC with Differential/Platelet -     COMPLETE METABOLIC PANEL WITH GFR -     TSH  NASH (nonalcoholic steatohepatitis) Check labs, avoid tylenol, alcohol, weight loss advised.   Thyroid disease Hypothyroidism-check TSH level-  on 1 pill daily but skips on Sunday, no biotin. -     TSH   Depression, major, in remission ([place]) - in remission off of medications, stress management techniques discussed, increase water, good sleep hygiene discussed, increase exercise, and increase veggies.   Hyperlipidemia, unspecified hyperlipidemia type Now taking crestor at 10 mg; titrate for LDL goal <100 Continue low cholesterol diet and exercise.  Check lipid panel.  -     Lipid panel  Medication management -     Magnesium  Obesity - BMI 30 - long discussion about weight loss, diet, and exercise -recommended diet heavy in fruits and veggies and low in animal meats, cheeses, and dairy products - plans to restart phentermine in a few weeks; has tolerated well in the past with benefit; reminded to take regular drug breaks - initial weight loss goal <190lb - increase fluids, reduce sweets  Vitamin D Deficiency She did increase dose; insurance doesn't cover; will check annually at [place] and meds as discussed. Further disposition pending results of labs. Over 30 minutes of exam, counseling, chart review, and critical decision making was performed Future Appointments  Date Time Provider [place]  01/22/2021  2:00 PM Liane Comber, NP [facility] None  07/23/2021  2:00 PM Liane Comber, NP [facility] None    HPI 69 y.o. female  presents for 3 month follow up on hypertension, cholesterol, prediabetes, and  vitamin D deficiency.   She has hx of major depression in remission off of medications.  She had hysterectomy, has been on estradiol, tapered off at one point but restarted due to lack of energy;   BMI is Body mass index is 30.34 kg/m., she is working on diet and exercise. She has been doing well with weight loss with phentermine/topamax, down 7 lb since last visit.  Wt Readings from Last 3 Encounters:  10/18/20 201 lb (91.2 kg)  07/12/20 208 lb 3.2 oz (94.4 kg)  04/17/20 200 lb (90.7 kg)   Diet; Irregular; trying to cut back on sweets Exercise: Y, body pump exercise class 3 days a week  Water intake: 32 fluid ounces, trying to increase   Her blood pressure has been controlled at home (130s/80s, rare up to 145), today their BP is BP: (!) 160/82, admits just had energy drink, doesn't typically drink, similar by manual recheck by provider  She does workout.   She denies chest pain, shortness of breath, dizziness.    She is on cholesterol medication, currently taking rosuvastatin 1/2 tab (10 mg) daily and denies myalgias. Her cholesterol is not at goal. The cholesterol last visit was:   Lab Results  Component Value Date   CHOL 215 (H) 07/12/2020   HDL 76 07/12/2020   LDLCALC 114 (H) 07/12/2020   TRIG 139 07/12/2020   CHOLHDL 2.8 07/12/2020    She has been working on diet and exercise for prediabetes, and denies paresthesia of the feet, polydipsia, polyuria and visual disturbances. Last A1C in the office was:  Lab Results  Component Value Date   HGBA1C 5.7 (H) 07/12/2020   She is on thyroid medication. Her medication was not changed last visit, to 1 tablet daily but none on Sunday. No diarrhea, some constipation. No palpations, sweating, anxiety.    Lab Results  Component Value Date   TSH 1.08 07/12/2020   [name], she did increase to 10000 IU daily.    Lab Results  Component Value Date   VD25OH 46 07/12/2020       Current Medications:  Current  Outpatient Medications on File Prior to Visit  Medication Sig Dispense Refill  . Cholecalciferol (VITAMIN D) 50 MCG (2000 UT) CAPS Take 2 capsules (4,000 Units total) by mouth daily. 30 capsule   . estradiol (ESTRACE) 0.5 MG tablet Take 1 tablet (0.5 mg total) by mouth daily. 90 tablet 0  . levothyroxine (SYNTHROID) 100 MCG tablet TAKE 1 TABLET BY MOUTH EVERY MORNING (EXCEPT 1/2 TAB ON Sunday) WITH ONLY GLASS OF WATER FOR 30 MINUTES (Patient taking differently: TAKE 1 TABLET BY MOUTH EVERY MORNING EXCEPT SKIP ON SUNDAYS WITH ONLY GLASS OF WATER FOR 30 MINUTES) 90 tablet 1  . Magnesium 500 MG TABS Take 1 tablet by mouth daily.    . Multiple Vitamins-Minerals (MULTIVITAMIN PO) Take by mouth daily.    Marland Kitchen OVER THE COUNTER MEDICATION Tumeric daily    . Potassium 75 MG TABS Take by mouth.    . rosuvastatin (CRESTOR) 20 MG tablet Take 0.5 tablets (10 mg total) by mouth daily. 90 tablet 3  . telmisartan (MICARDIS) 80 MG tablet Take 1/2 tablet Daily for BP 90 tablet 1  . fluticasone (FLONASE) 50 MCG/ACT nasal spray Place 2 sprays into both nostrils daily. (Patient not taking: Reported on 07/12/2020) 16 g 0  . phentermine (ADIPEX-P) 37.5 MG tablet Take 1 tablet (37.5 mg total) by mouth daily before breakfast. (Patient not taking: Reported on 10/18/2020) 30 tablet 2  . topiramate (TOPAMAX) 25 MG tablet TAKE 1/2 TO 1 TABLET BY MOUTH EVERY NIGHT AT BEDTIME (Patient not taking: Reported on 10/18/2020) 90 tablet 1   No current facility-administered medications on file prior to visit.   Medical History:  Past Medical History:  Diagnosis Date  . Anemia   . Anxiety   . Basal cell carcinoma 08/17/2007   left nostril MOHS  . BCC (basal cell carcinoma of skin) 04/06/2013   right nose tx cx3 106f  . Cancer (HCC)    basal cell CA- nose  . Colon polyp, hyperplastic    2005  . Depression   . GERD (gastroesophageal reflux disease)   . Hyperlipidemia   . Prediabetes   . Thyroid disease   . Unspecified essential  hypertension   . Vitamin D deficiency    Allergies:  Allergies  Allergen Reactions  . Naldecon Senior [Guaifenesin]     Unsure of reaction  . Prednisone     Unsure of reaction  . Benazepril Hcl Other (See Comments) and Cough    Not effective for pt Not effective for pt Not effective for pt     Review of Systems:  Review of Systems  Constitutional: Negative for malaise/fatigue and weight loss.  HENT: Negative for hearing loss and tinnitus.   Eyes: Negative for blurred vision and double vision.  Respiratory: Negative for cough, shortness of breath and wheezing.   Cardiovascular: Negative for chest pain, palpitations, orthopnea, claudication and leg swelling.  Gastrointestinal: Negative for abdominal pain, blood in stool, constipation, diarrhea, heartburn, melena, nausea  and vomiting.  Genitourinary: Negative.   Musculoskeletal: Negative for joint pain and myalgias.  Skin: Negative for rash.  Neurological: Negative for dizziness, tingling, sensory change, weakness and headaches.  Endo/Heme/Allergies: Negative for polydipsia.  Psychiatric/Behavioral: Negative.   All other systems reviewed and are negative.   Family history- Review and unchanged Social history- Review and unchanged Physical Exam: BP (!) 160/82   Pulse 74   Temp (!) 96.8 F (36 C)   Wt 201 lb (91.2 kg)   SpO2 97%   BMI 30.34 kg/m  Wt Readings from Last 3 Encounters:  10/18/20 201 lb (91.2 kg)  07/12/20 208 lb 3.2 oz (94.4 kg)  04/17/20 200 lb (90.7 kg)   General Appearance: Well nourished, in no apparent distress. Eyes: PERRLA, EOMs, conjunctiva no swelling or erythema Sinuses: No Frontal/maxillary tenderness ENT/Mouth: Ext aud canals clear, TMs without erythema, bulging. Good dentition. No erythema, swelling, or exudate on post pharynx. Tonsils not swollen or erythematous. Hearing normal.  Neck: Supple, thyroid normal.  Respiratory: Respiratory effort normal, BS equal bilaterally without rales,  rhonchi, wheezing or stridor.  Cardio: RRR with no MRGs. Brisk peripheral pulses without edema.  Abdomen: Soft, + BS,  Non tender, no guarding, rebound, hernias, masses. Lymphatics: Non tender without lymphadenopathy.  Musculoskeletal: Full ROM, 5/5 strength, normal gait Skin: Warm, dry without rashes, lesions, ecchymosis.  Neuro: Cranial nerves intact. Normal muscle tone, no cerebellar symptoms. Psych: Awake and oriented X 3, normal affect, Insight and Judgment appropriate.    Izora Ribas, NP 2:38 PM [facility]

## 2020-10-18 ENCOUNTER — Ambulatory Visit (INDEPENDENT_AMBULATORY_CARE_PROVIDER_SITE_OTHER): Payer: BC Managed Care – PPO | Admitting: Adult Health

## 2020-10-18 ENCOUNTER — Other Ambulatory Visit: Payer: Self-pay

## 2020-10-18 ENCOUNTER — Encounter: Payer: Self-pay | Admitting: Adult Health

## 2020-10-18 VITALS — BP 160/82 | HR 74 | Temp 96.8°F | Wt 201.0 lb

## 2020-10-18 DIAGNOSIS — Z79899 Other long term (current) drug therapy: Secondary | ICD-10-CM

## 2020-10-18 DIAGNOSIS — E079 Disorder of thyroid, unspecified: Secondary | ICD-10-CM | POA: Diagnosis not present

## 2020-10-18 DIAGNOSIS — E559 Vitamin D deficiency, unspecified: Secondary | ICD-10-CM

## 2020-10-18 DIAGNOSIS — E785 Hyperlipidemia, unspecified: Secondary | ICD-10-CM

## 2020-10-18 DIAGNOSIS — Z6830 Body mass index (BMI) 30.0-30.9, adult: Secondary | ICD-10-CM

## 2020-10-18 DIAGNOSIS — I10 Essential (primary) hypertension: Secondary | ICD-10-CM

## 2020-10-18 DIAGNOSIS — E669 Obesity, unspecified: Secondary | ICD-10-CM

## 2020-10-18 DIAGNOSIS — K7581 Nonalcoholic steatohepatitis (NASH): Secondary | ICD-10-CM

## 2020-10-18 DIAGNOSIS — F325 Major depressive disorder, single episode, in full remission: Secondary | ICD-10-CM

## 2020-10-18 DIAGNOSIS — R7309 Other abnormal glucose: Secondary | ICD-10-CM

## 2020-10-18 DIAGNOSIS — E66811 Obesity, class 1: Secondary | ICD-10-CM

## 2020-10-18 MED ORDER — PHENTERMINE HCL 37.5 MG PO TABS: 37.5000 mg | ORAL_TABLET | Freq: Every day | ORAL | 2 refills | Status: DC

## 2020-10-18 MED ORDER — ESTRADIOL 0.5 MG PO TABS: ORAL_TABLET | ORAL | 0 refills | Status: DC

## 2020-10-18 MED ORDER — TOPIRAMATE 25 MG PO TABS: ORAL_TABLET | ORAL | 1 refills | Status: DC

## 2020-10-18 NOTE — Patient Instructions (Addendum)
Goals    . Blood Pressure < 130/80    . DIET - EAT MORE FRUITS AND VEGETABLES     Aim for 7+ servings (1/2 cup each) daily     . DIET - INCREASE WATER INTAKE     65-80+ fluid ounces daily     . Weight (lb) < 190 lb (86.2 kg)       Try estradiol taper - alternate 1/2 and whole for 1 month, then 1/2 tab daily -      HYPERTENSION INFORMATION  Monitor your blood pressure at home, please keep a record and bring that in with you to your next office visit.   Go to the ER if any CP, SOB, nausea, dizziness, severe HA, changes vision/speech  Testing/Procedures: HOW TO TAKE YOUR BLOOD PRESSURE:  Rest 5 minutes before taking your blood pressure.  Don't smoke or drink caffeinated beverages for at least 30 minutes before.  Take your blood pressure before (not after) you eat.  Sit comfortably with your back supported and both feet on the floor (don't cross your legs).  Elevate your arm to heart level on a table or a desk.  Use the proper sized cuff. It should fit smoothly and snugly around your bare upper arm. There should be enough room to slip a fingertip under the cuff. The bottom edge of the cuff should be 1 inch above the crease of the elbow.   Your most recent BP: BP: (!) 160/82   Take your medications faithfully as instructed. Maintain a healthy weight. Get at least 150 minutes of aerobic exercise per week. Minimize salt intake. Minimize alcohol intake  DASH Eating Plan DASH stands for "Dietary Approaches to Stop Hypertension." The DASH eating plan is a healthy eating plan that has been shown to reduce high blood pressure (hypertension). Additional health benefits may include reducing the risk of type 2 diabetes mellitus, heart disease, and stroke. The DASH eating plan may also help with weight loss. WHAT DO I NEED TO KNOW ABOUT THE DASH EATING PLAN? For the DASH eating plan, you will follow these general guidelines:  Choose foods with a percent daily value for sodium of  less than 5% (as listed on the food label).  Use salt-free seasonings or herbs instead of table salt or sea salt.  Check with your health care provider or pharmacist before using salt substitutes.  Eat lower-sodium products, often labeled as "lower sodium" or "no salt added."  Eat fresh foods.  Eat more vegetables, fruits, and low-fat dairy products.  Choose whole grains. Look for the word "whole" as the first word in the ingredient list.  Choose fish and skinless chicken or [place] more often than red meat. Limit fish, poultry, and meat to 6 oz (170 g) each day.  Limit sweets, desserts, sugars, and sugary drinks.  Choose heart-healthy fats.  Limit cheese to 1 oz (28 g) per day.  Eat more home-cooked food and less restaurant, buffet, and fast food.  Limit fried foods.  Cook foods using methods other than frying.  Limit canned vegetables. If you do use them, rinse them well to decrease the sodium.  When eating at a restaurant, ask that your food be prepared with less salt, or no salt if possible. WHAT FOODS CAN I EAT? Seek help from a dietitian for individual calorie needs. Grains Whole grain or whole wheat bread. Brown rice. Whole grain or whole wheat pasta. Quinoa, bulgur, and whole grain cereals. Low-sodium cereals. Corn or whole wheat flour  tortillas. Whole grain cornbread. Whole grain crackers. Low-sodium crackers. Vegetables Fresh or frozen vegetables (raw, steamed, roasted, or grilled). Low-sodium or reduced-sodium tomato and vegetable juices. Low-sodium or reduced-sodium tomato sauce and paste. Low-sodium or reduced-sodium canned vegetables.  Fruits All fresh, canned (in natural juice), or frozen fruits. Meat and Other Protein Products Ground beef (85% or leaner), grass-fed beef, or beef trimmed of fat. Skinless chicken or [place]. Ground chicken or [place]. Pork trimmed of fat. All fish and seafood. Eggs. Dried beans, peas, or lentils. Unsalted nuts and seeds. Unsalted  canned beans. Dairy Low-fat dairy products, such as skim or 1% milk, 2% or reduced-fat cheeses, low-fat ricotta or cottage cheese, or plain low-fat yogurt. Low-sodium or reduced-sodium cheeses. Fats and Oils Tub margarines without trans fats. Light or reduced-fat mayonnaise and salad dressings (reduced sodium). Avocado. Safflower, olive, or canola oils. Natural peanut or almond butter. Other Unsalted popcorn and pretzels. The items listed above may not be a complete list of recommended foods or beverages. Contact your dietitian for more options. WHAT FOODS ARE NOT RECOMMENDED? Grains White bread. White pasta. White rice. Refined cornbread. Bagels and croissants. Crackers that contain trans fat. Vegetables Creamed or fried vegetables. Vegetables in a cheese sauce. Regular canned vegetables. Regular canned tomato sauce and paste. Regular tomato and vegetable juices. Fruits Dried fruits. Canned fruit in light or heavy syrup. Fruit juice. Meat and Other Protein Products Fatty cuts of meat. Ribs, chicken wings, bacon, sausage, bologna, salami, chitterlings, fatback, hot dogs, bratwurst, and packaged luncheon meats. Salted nuts and seeds. Canned beans with salt. Dairy Whole or 2% milk, cream, half-and-half, and cream cheese. Whole-fat or sweetened yogurt. Full-fat cheeses or blue cheese. Nondairy creamers and whipped toppings. Processed cheese, cheese spreads, or cheese curds. Condiments Onion and garlic salt, seasoned salt, table salt, and sea salt. Canned and packaged gravies. Worcestershire sauce. Tartar sauce. Barbecue sauce. Teriyaki sauce. Soy sauce, including reduced sodium. Steak sauce. Fish sauce. Oyster sauce. Cocktail sauce. Horseradish. Ketchup and mustard. Meat flavorings and tenderizers. Bouillon cubes. Hot sauce. Tabasco sauce. Marinades. Taco seasonings. Relishes. Fats and Oils Butter, stick margarine, lard, shortening, ghee, and bacon fat. Coconut, palm kernel, or palm oils. Regular  salad dressings. Other Pickles and olives. Salted popcorn and pretzels. The items listed above may not be a complete list of foods and beverages to avoid. Contact your dietitian for more information. WHERE CAN I FIND MORE INFORMATION? National Heart, Lung, and Blood Institute: travelstabloid.com Document Released: 12/05/2011 Document Revised: 05/02/2014 Document Reviewed: 10/20/2013 [facility] Patient Information 2015 [place], [place]. This information is not intended to replace advice given to you by your health care provider. Make sure you discuss any questions you have with your health care provider.

## 2020-10-19 ENCOUNTER — Other Ambulatory Visit: Payer: Self-pay | Admitting: Adult Health

## 2020-10-19 DIAGNOSIS — I10 Essential (primary) hypertension: Secondary | ICD-10-CM

## 2020-10-19 LAB — CBC WITH DIFFERENTIAL/PLATELET
Absolute Monocytes: 634 cells/uL (ref 200–950)
Basophils Absolute: 61 cells/uL (ref 0–200)
Basophils Relative: 1 %
Eosinophils Absolute: 146 cells/uL (ref 15–500)
Eosinophils Relative: 2.4 %
HCT: 40.8 % (ref 35.0–45.0)
Hemoglobin: 13.6 g/dL (ref 11.7–15.5)
Lymphs Abs: 1635 cells/uL (ref 850–3900)
MCH: 30.1 pg (ref 27.0–33.0)
MCHC: 33.3 g/dL (ref 32.0–36.0)
MCV: 90.3 fL (ref 80.0–100.0)
MPV: 9.5 fL (ref 7.5–12.5)
Monocytes Relative: 10.4 %
Neutro Abs: 3623 cells/uL (ref 1500–7800)
Neutrophils Relative %: 59.4 %
Platelets: 231 10*3/uL (ref 140–400)
RBC: 4.52 10*6/uL (ref 3.80–5.10)
RDW: 12.6 % (ref 11.0–15.0)
Total Lymphocyte: 26.8 %
WBC: 6.1 10*3/uL (ref 3.8–10.8)

## 2020-10-19 LAB — COMPLETE METABOLIC PANEL WITH GFR
AG Ratio: 2 (calc) (ref 1.0–2.5)
ALT: 21 U/L (ref 6–29)
AST: 15 U/L (ref 10–35)
Albumin: 4.4 g/dL (ref 3.6–5.1)
Alkaline phosphatase (APISO): 68 U/L (ref 37–153)
BUN: 13 mg/dL (ref 7–25)
CO2: 26 mmol/L (ref 20–32)
Calcium: 9.3 mg/dL (ref 8.6–10.4)
Chloride: 105 mmol/L (ref 98–110)
Creat: 0.78 mg/dL (ref 0.50–0.99)
GFR, Est African American: 91 mL/min/{1.73_m2} (ref >=60)
GFR, Est Non African American: 79 mL/min/{1.73_m2} (ref >=60)
Globulin: 2.2 g/dL (calc) (ref 1.9–3.7)
Glucose, Bld: 90 mg/dL (ref 65–99)
Potassium: 4.1 mmol/L (ref 3.5–5.3)
Sodium: 141 mmol/L (ref 135–146)
Total Bilirubin: 0.4 mg/dL (ref 0.2–1.2)
Total Protein: 6.6 g/dL (ref 6.1–8.1)

## 2020-10-19 LAB — LIPID PANEL
Cholesterol: 211 mg/dL — ABNORMAL HIGH (ref <200)
HDL: 82 mg/dL (ref >=50)
LDL Cholesterol (Calc): 109 mg/dL (calc) — ABNORMAL HIGH
Non-HDL Cholesterol (Calc): 129 mg/dL (calc) (ref <130)
Total CHOL/HDL Ratio: 2.6 (calc) (ref <5.0)
Triglycerides: 107 mg/dL (ref <150)

## 2020-10-19 LAB — TSH: TSH: 1.42 mIU/L (ref 0.40–4.50)

## 2020-10-19 LAB — MAGNESIUM: Magnesium: 2.1 mg/dL (ref 1.5–2.5)

## 2020-10-19 MED ORDER — ROSUVASTATIN CALCIUM 20 MG PO TABS: 20.0000 mg | ORAL_TABLET | Freq: Every day | ORAL | 3 refills | Status: DC

## 2020-11-01 ENCOUNTER — Other Ambulatory Visit: Payer: Self-pay

## 2020-11-01 ENCOUNTER — Ambulatory Visit (INDEPENDENT_AMBULATORY_CARE_PROVIDER_SITE_OTHER): Payer: BC Managed Care – PPO

## 2020-11-01 VITALS — BP 158/90 | HR 92 | Temp 97.2°F | Wt 203.0 lb

## 2020-11-01 DIAGNOSIS — I10 Essential (primary) hypertension: Secondary | ICD-10-CM

## 2020-11-01 MED ORDER — HYDROCHLOROTHIAZIDE 25 MG PO TABS: ORAL_TABLET | ORAL | 0 refills | Status: DC

## 2020-11-01 NOTE — Progress Notes (Signed)
Patient presents to the office for a recheck of BP. Reading today was 158-90. Provider aware and advised patient to start taking HCTZ 29m, take 1/2 tablet daily and then in a one week, if BP is >140/80 to increase to one full tablet.  Patient needs to follow up in 4 weeks to recheck BP and have labs done.

## 2020-11-08 ENCOUNTER — Other Ambulatory Visit: Payer: Self-pay | Admitting: Internal Medicine

## 2020-11-08 DIAGNOSIS — Z1231 Encounter for screening mammogram for malignant neoplasm of breast: Secondary | ICD-10-CM

## 2020-11-13 ENCOUNTER — Other Ambulatory Visit: Payer: Self-pay | Admitting: Adult Health

## 2020-11-13 MED ORDER — LEVOTHYROXINE SODIUM 100 MCG PO TABS: ORAL_TABLET | ORAL | 3 refills | Status: DC

## 2020-11-17 DIAGNOSIS — Z1231 Encounter for screening mammogram for malignant neoplasm of breast: Secondary | ICD-10-CM

## 2020-11-29 ENCOUNTER — Ambulatory Visit: Payer: Self-pay

## 2020-11-29 ENCOUNTER — Other Ambulatory Visit: Payer: Self-pay

## 2020-11-29 VITALS — BP 140/80 | HR 105 | Temp 96.4°F | Wt 202.2 lb

## 2020-11-29 DIAGNOSIS — Z79899 Other long term (current) drug therapy: Secondary | ICD-10-CM

## 2020-11-29 DIAGNOSIS — N289 Disorder of kidney and ureter, unspecified: Secondary | ICD-10-CM | POA: Diagnosis not present

## 2020-11-29 DIAGNOSIS — I10 Essential (primary) hypertension: Secondary | ICD-10-CM

## 2020-11-29 DIAGNOSIS — N182 Chronic kidney disease, stage 2 (mild): Secondary | ICD-10-CM | POA: Diagnosis not present

## 2020-11-30 ENCOUNTER — Other Ambulatory Visit: Payer: Self-pay | Admitting: Adult Health

## 2020-11-30 DIAGNOSIS — I10 Essential (primary) hypertension: Secondary | ICD-10-CM

## 2020-11-30 DIAGNOSIS — Z79899 Other long term (current) drug therapy: Secondary | ICD-10-CM

## 2020-11-30 LAB — BASIC METABOLIC PANEL WITH GFR
BUN/Creatinine Ratio: 16 (calc) (ref 6–22)
BUN: 19 mg/dL (ref 7–25)
CO2: 29 mmol/L (ref 20–32)
Calcium: 9.9 mg/dL (ref 8.6–10.4)
Chloride: 103 mmol/L (ref 98–110)
Creat: 1.21 mg/dL — ABNORMAL HIGH (ref 0.50–0.99)
GFR, Est African American: 54 mL/min/{1.73_m2} — ABNORMAL LOW (ref >=60)
GFR, Est Non African American: 46 mL/min/{1.73_m2} — ABNORMAL LOW (ref >=60)
Glucose, Bld: 130 mg/dL — ABNORMAL HIGH (ref 65–99)
Potassium: 4 mmol/L (ref 3.5–5.3)
Sodium: 141 mmol/L (ref 135–146)

## 2020-11-30 MED ORDER — BISOPROLOL-HYDROCHLOROTHIAZIDE 5-6.25 MG PO TABS: ORAL_TABLET | ORAL | 0 refills | Status: DC

## 2020-12-16 ENCOUNTER — Other Ambulatory Visit: Payer: Self-pay | Admitting: Adult Health

## 2020-12-16 MED ORDER — TELMISARTAN 80 MG PO TABS: ORAL_TABLET | ORAL | 1 refills | Status: DC

## 2020-12-24 ENCOUNTER — Other Ambulatory Visit: Payer: Self-pay | Admitting: Internal Medicine

## 2020-12-24 DIAGNOSIS — I10 Essential (primary) hypertension: Secondary | ICD-10-CM

## 2020-12-24 MED ORDER — ROSUVASTATIN CALCIUM 20 MG PO TABS: ORAL_TABLET | ORAL | 0 refills | Status: DC

## 2021-01-18 ENCOUNTER — Other Ambulatory Visit: Payer: Self-pay | Admitting: Internal Medicine

## 2021-01-18 DIAGNOSIS — Z1231 Encounter for screening mammogram for malignant neoplasm of breast: Secondary | ICD-10-CM

## 2021-01-19 NOTE — Progress Notes (Signed)
Annual Medicare Visit  Assessment and Plan:  Welcome to medicare  Due mammogram- has scheduled Plan bone density with next mammogram  AAA screening done today   Essential hypertension - resume full dose telmisartan, follow up sooner if having recurrent tingling or remains above goal and will change meds monitor at home if greater than 130/80, DASH diet, exercise and monitor at home.  - CBC with Differential/Platelet - CMP/GFR  Other abnormal glucose (prediabetes) Discussed general issues about diabetes pathophysiology and management., Educational material distributed., Suggested low cholesterol diet., Encouraged aerobic exercise., Discussed foot care., Reminded to get yearly retinal exam. Check A1C q56m- A1C  Thyroid disease Hypothyroidism-check TSH level, continue medications the same, reminded to take on an empty stomach 30-638ms before food.  - TSH   Hyperlipidemia -continue medications, check lipids, decrease fatty foods, increase activity.  - Lipid panel  Vitamin D deficiency - Vit D  25 hydroxy (rtn osteoporosis monitoring)  Medication management - Magnesium   Depression, remission In remission off of medications at this time stress management techniques discussed, increase water, good sleep hygiene discussed, increase exercise, and increase veggies.   Gastroesophageal reflux disease without esophagitis - continue PRN OTC H2i/tums  Stress incontinence Better after PT, resume exercises at home.   NASH (nonalcoholic steatohepatitis) Check labs, avoid tylenol, alcohol, weight loss advised.   History of basal cell cancer Patient will schedule follow up Dr. TaDenna HaggardIdiopathic hematuria (microscopic) Stable/unchanged for 5+ years; saw urology and no further workup recommended per patient  BMI 30 Long discussion about weight loss, diet, and exercise Discussed final goal weight and current weight loss goal (<190lb) Patient will work on cut down on snacking,  increase exercise Patient has been on phentermine with benefit and no SE, taking drug breaks; also topamax; Discussed need for increased fiber intake, avoid keeping bad snacks in home continue close follow up.   Discussed med's effects and SE's. Screening labs and tests as requested with regular follow-up as recommended. Future Appointments  Date Time Provider [place]2/08/2021 11:50 AM [facility] MM 3 [facility]  07/23/2021  2:00 PM CoLiane ComberNP [facility] None    HPI 89.o. female  presents for AWV and 3 month follow up. She has Essential hypertension; Hyperlipidemia; Thyroid disease; Other abnormal glucose (prediabetes); GERD (gastroesophageal reflux disease); Depression, major, in remission ([place] Vitamin D deficiency; Medication management; Stress incontinence; NASH (nonalcoholic steatohepatitis); History of basal cell cancer; Obesity (BMI 30.0-34.9); and Idiopathic hematuria on their problem list.   Married, no children, recently retired aiMicrobiologistEnjoying retirement. Has a lot of home projects to work on.   She has history of stress incontinence, has improved with pelvic floor PT, setting alarm at home to do more regular exercise.   She is no longer following up with Dr. HoPhilis Piques/p hysterectomy, has recently tapered off of estrogen and feels doing well.   Idiopathic hematuria, was evaluated by urology and no further workup recommended, has been stable for several years.   Hx of major depression in remission.   BMI is Body mass index is 30.64 kg/m., she is working on diet and exercise, [facility], admits diet is not at good in the last several months, can't seem to stop eating, would like to restart phentermine She does have prediabetes, hyperlipidemia and fatty liver per [id number].  Wt Readings from Last 3 Encounters:  01/22/21 203 lb (92.1 kg)  11/29/20 202  lb 3.2 oz (91.7 kg)  11/01/20 203 lb  (92.1 kg)   Her blood pressure has been running 120/60s, taking telmisartan 80 mg daily, today their BP is BP: 118/76 She does workout. She denies chest pain, shortness of breath, dizziness.   She is on cholesterol medication, rosuvastatin 20 mg daily and denies myalgias. Her LDL cholesterol is not at goal. The cholesterol last visit was:   Lab Results  Component Value Date   CHOL 211 (H) 10/18/2020   HDL 82 10/18/2020   LDLCALC 109 (H) 10/18/2020   TRIG 107 10/18/2020   CHOLHDL 2.6 10/18/2020   She denies Dm polys. Last A1C in the office was:  Lab Results  Component Value Date   HGBA1C 5.7 (H) 07/12/2020   She has been pushing water intake, getting 1-2 L daily, up from previous. Last GFR:  Lab Results  Component Value Date   GFRNONAA 46 (L) 11/29/2020   GFRNONAA 79 10/18/2020   GFRNONAA 71 07/12/2020   [name],  taking ? 5000 IU daily. Lab Results  Component Value Date   VD25OH 46 07/12/2020     She is on thyroid medication. Her medication was not changed last visit. Patient denies nervousness, palpitations and weight changes.  She takes 100 mcg 1 tab daily except Sundays not taking anything  Lab Results  Component Value Date   TSH 1.42 10/18/2020      Current Medications:  Current Outpatient Medications on File Prior to Visit  Medication Sig  . bisoprolol-hydrochlorothiazide (ZIAC) 5-6.25 MG tablet Start by taking 1/2 tab daily for blood pressure; then if still running 130/80+ in 1 week, increase to whole tab.  . Cholecalciferol (VITAMIN D) 50 MCG (2000 UT) CAPS Take 2 capsules (4,000 Units total) by mouth daily.  Marland Kitchen levothyroxine (SYNTHROID) 100 MCG tablet TAKE 1 TABLET BY MOUTH EVERY MORNING EXCEPT SKIP ON SUNDAYS WITH ONLY GLASS OF WATER FOR 30 MINUTES  . Magnesium 500 MG TABS Take 1 tablet by mouth daily.  . Multiple Vitamins-Minerals (MULTIVITAMIN PO) Take by mouth daily.  Marland Kitchen OVER THE COUNTER MEDICATION Tumeric daily  . Potassium 75 MG  TABS Take by mouth.  . rosuvastatin (CRESTOR) 20 MG tablet Take     1 tablet     Daily      for Cholesterol  . telmisartan (MICARDIS) 80 MG tablet Take 1/2 tablet Daily for BP  . phentermine (ADIPEX-P) 37.5 MG tablet Take 1 tablet (37.5 mg total) by mouth daily before breakfast. (Patient not taking: Reported on 01/22/2021)  . topiramate (TOPAMAX) 25 MG tablet TAKE 1/2 TO 1 TABLET BY MOUTH EVERY NIGHT AT BEDTIME (Patient not taking: Reported on 01/22/2021)   No current facility-administered medications on file prior to visit.   Health Maintenance:   Immunization History  Administered Date(s) Administered  . Influenza-Unspecified 09/30/2015, 09/29/2016, 10/12/2019, 09/28/2020  . MMR 05/30/2014  . PFIZER(Purple Top)SARS-COV-2 Vaccination 02/24/2020, 03/21/2020, 10/14/2020  . Pneumococcal Conjugate-13 07/06/2018  . Pneumococcal Polysaccharide-23 03/25/2006, 07/07/2019  . Td 03/15/2004  . Tdap 06/02/2014   Tetanus: 2015 Pneumovax: 2007, 2020 Prevnar 13: 2019 Flu vaccine: 08/2020 Shingrix: will call insurance  Covid 19: 3/3, 2021, pfizer   Pap: 2018 -s/p total hysterectomy, Dr. Philis Pique. DONE MGM: 04/2017 diagnostic right, - has scheduled 02/07/2021 DEXA: 2007 via GYN - schedule with mammogram 2023  Colonoscopy: 04/2014 due 10 years EGD: N/A  Vision: Dr. Coral Ceo, Dr. Clydene Laming, last visit 2021 Dental: Dr. Posey Pronto, last visit 2022, goes q42mSkin: hx of  BCC, Dr. Denna Haggard, 2019 encouraged to schedule   Patient Care Team: Unk Pinto, MD as PCP - General (Internal Medicine) Melissa Noon, [place] as Referring Physician (Optometry) Lavonna Monarch, MD as Consulting Physician (Dermatology) Inda Castle, MD (Inactive) as Consulting Physician (Gastroenterology) Bobbye Charleston, MD as Consulting Physician (Obstetrics and Gynecology) Elsie Saas, MD as Consulting Physician (Orthopedic Surgery)  Medical History:  Past Medical History:  Diagnosis Date  . Anemia   . Anxiety   . Basal cell  carcinoma 08/17/2007   left nostril MOHS  . BCC (basal cell carcinoma of skin) 04/06/2013   right nose tx cx3 55f  . Cancer (HCC)    basal cell CA- nose  . Colon polyp, hyperplastic    2005  . Depression   . GERD (gastroesophageal reflux disease)   . Hyperlipidemia   . Prediabetes   . Thyroid disease   . Unspecified essential hypertension   . Vitamin D deficiency    Allergies Allergies  Allergen Reactions  . Naldecon Senior [Guaifenesin]     Unsure of reaction  . Prednisone     Unsure of reaction  . Benazepril Hcl Other (See Comments) and Cough    Not effective for pt Not effective for pt Not effective for pt    SURGICAL HISTORY She  has a past surgical history that includes Tonsillectomy; Thyroidectomy; Vaginal hysterectomy (1994); Breast lumpectomy (Right); Colonoscopy; Mohs surgery; Knee arthroscopy (Right, 2000); Breast excisional biopsy (Left, 2002); and Cataract extraction, bilateral (Bilateral, 07/2018). FAMILY HISTORY Her family history includes Alzheimer's disease in her father; Atrial fibrillation in her sister; Diabetes type II in her sister; Hypertension in her father and mother; Valvular heart disease in her mother. SOCIAL HISTORY She  reports that she quit smoking about 15 years ago. Her smoking use included cigarettes. She has a 2.50 pack-year smoking history. She has never used smokeless tobacco. She reports current alcohol use of about 3.0 standard drinks of alcohol per week. She reports that she does not use drugs.   MEDICARE WELLNESS OBJECTIVES: Physical activity:   Cardiac risk factors:   Depression/mood screen:   [facility] 07[facility]  Decreased Interest 0  Down, Depressed, Hopeless 1  PHQ - 2 Score 1  Altered sleeping 0  Tired, decreased energy 0  Change in appetite 0  Feeling bad or failure about yourself  0  Trouble concentrating 0  Moving slowly or fidgety/restless 0  Suicidal thoughts 0  PHQ-9 Score 1  Difficult doing  work/chores Not difficult at all    ADLs:  No flowsheet data found.   Cognitive Testing  Alert? Yes  Normal Appearance?Yes  Oriented to person? Yes  Place? Yes   Time? Yes  Recall of three objects?  Yes  Can perform simple calculations? Yes  Displays appropriate judgment?Yes  Can read the correct time from a watch face?Yes  EOL planning: Does Patient Have a Medical Advance Directive?: No Would patient like information on creating a medical advance directive?: No - Patient declined     Review of Systems  Constitutional: Negative.  Negative for malaise/fatigue and weight loss.  HENT: Negative.  Negative for hearing loss and tinnitus.   Eyes: Negative.  Negative for blurred vision and double vision.  Respiratory: Negative.  Negative for cough, sputum production, shortness of breath and wheezing.   Cardiovascular: Negative.  Negative for chest pain, palpitations, orthopnea, claudication, leg swelling and PND.  Gastrointestinal: Negative for abdominal pain, blood in stool, constipation, diarrhea, heartburn, melena, nausea and vomiting.  Genitourinary: Negative.   Musculoskeletal: Negative for back pain, falls, joint pain, myalgias and neck pain.  Skin: Negative.  Negative for rash.  Neurological: Negative.  Negative for dizziness, tingling, sensory change, weakness and headaches.  Endo/Heme/Allergies: Negative.  Negative for polydipsia.  Psychiatric/Behavioral: Negative.  Negative for depression, memory loss, substance abuse and suicidal ideas. The patient is not nervous/anxious and does not have insomnia.   All other systems reviewed and are negative.   Physical Exam: Estimated body mass index is 30.64 kg/m as calculated from the following:   Height as of 07/12/20: 5' 8.25" (1.734 m).   Weight as of this encounter: 203 lb (92.1 kg). BP 118/76   Pulse 73   Temp 97.9 F (36.6 C)   Wt 203 lb (92.1 kg)   SpO2 97%   BMI 30.64 kg/m    General Appearance: Well nourished, in no  apparent distress. Eyes: PERRLA, EOMs, conjunctiva no swelling or erythema Sinuses: No Frontal/maxillary tenderness ENT/Mouth: Ext aud canals clear, normal light reflex with TMs without erythema, bulging.  Good dentition. No erythema, swelling, or exudate on post pharynx. Tonsils not swollen or erythematous. Hearing normal.  Neck: Supple, thyroid normal. No bruits Respiratory: Respiratory effort normal, BS equal bilaterally without rales, rhonchi, wheezing or stridor. Cardio: RRR without murmurs, rubs or gallops. Brisk peripheral pulses without edema.  Chest: symmetric, with normal excursions and percussion. Abdomen: Soft, +BS, non tender, negative murphy's, no guarding, rebound, hernias, masses, or organomegaly. .  Lymphatics: Non tender without lymphadenopathy.  Genitourinary: defer to GYN Musculoskeletal: Full ROM all peripheral extremities,5/5 strength, and normal gait. Skin: Warm, dry without rashes, concerning lesions, ecchymosis.  Neuro: Cranial nerves intact, reflexes equal bilaterally. Normal muscle tone, no cerebellar symptoms. Sensation intact.  Psych: Awake and oriented X 3, normal affect, Insight and Judgment appropriate.    Medicare Attestation I have personally reviewed: The patient's medical and social history Their use of alcohol, tobacco or illicit drugs Their current medications and supplements The patient's functional ability including ADLs,fall risks, home safety risks, cognitive, and hearing and visual impairment Diet and physical activities Evidence for depression or mood disorders  The patient's weight, height, BMI, and visual acuity have been recorded in the chart.  I have made referrals, counseling, and provided education to the patient based on review of the above and I have provided the patient with a written personalized care plan for preventive services.     Izora Ribas, NP 2:05 PM [facility]

## 2021-01-22 ENCOUNTER — Other Ambulatory Visit: Payer: Self-pay

## 2021-01-22 ENCOUNTER — Encounter: Payer: Self-pay | Admitting: Adult Health

## 2021-01-22 ENCOUNTER — Ambulatory Visit (INDEPENDENT_AMBULATORY_CARE_PROVIDER_SITE_OTHER): Payer: Medicare Other | Admitting: Adult Health

## 2021-01-22 VITALS — BP 118/76 | HR 73 | Temp 97.9°F | Wt 203.0 lb

## 2021-01-22 DIAGNOSIS — I10 Essential (primary) hypertension: Secondary | ICD-10-CM | POA: Diagnosis not present

## 2021-01-22 DIAGNOSIS — E785 Hyperlipidemia, unspecified: Secondary | ICD-10-CM

## 2021-01-22 DIAGNOSIS — Z79899 Other long term (current) drug therapy: Secondary | ICD-10-CM

## 2021-01-22 DIAGNOSIS — Z0001 Encounter for general adult medical examination with abnormal findings: Secondary | ICD-10-CM

## 2021-01-22 DIAGNOSIS — E079 Disorder of thyroid, unspecified: Secondary | ICD-10-CM | POA: Diagnosis not present

## 2021-01-22 DIAGNOSIS — Z0000 Encounter for general adult medical examination without abnormal findings: Secondary | ICD-10-CM

## 2021-01-22 DIAGNOSIS — E66811 Obesity, class 1: Secondary | ICD-10-CM

## 2021-01-22 DIAGNOSIS — K219 Gastro-esophageal reflux disease without esophagitis: Secondary | ICD-10-CM

## 2021-01-22 DIAGNOSIS — F325 Major depressive disorder, single episode, in full remission: Secondary | ICD-10-CM

## 2021-01-22 DIAGNOSIS — K7581 Nonalcoholic steatohepatitis (NASH): Secondary | ICD-10-CM

## 2021-01-22 DIAGNOSIS — R6889 Other general symptoms and signs: Secondary | ICD-10-CM

## 2021-01-22 DIAGNOSIS — E669 Obesity, unspecified: Secondary | ICD-10-CM

## 2021-01-22 DIAGNOSIS — N029 Recurrent and persistent hematuria with unspecified morphologic changes: Secondary | ICD-10-CM

## 2021-01-22 DIAGNOSIS — E559 Vitamin D deficiency, unspecified: Secondary | ICD-10-CM | POA: Diagnosis not present

## 2021-01-22 DIAGNOSIS — Z136 Encounter for screening for cardiovascular disorders: Secondary | ICD-10-CM | POA: Diagnosis not present

## 2021-01-22 DIAGNOSIS — Z85828 Personal history of other malignant neoplasm of skin: Secondary | ICD-10-CM

## 2021-01-22 DIAGNOSIS — R7309 Other abnormal glucose: Secondary | ICD-10-CM

## 2021-01-22 DIAGNOSIS — N393 Stress incontinence (female) (male): Secondary | ICD-10-CM

## 2021-01-22 NOTE — Patient Instructions (Addendum)
Ms. Gavidia , Thank you for taking time to come for your Medicare Wellness Visit. I appreciate your ongoing commitment to your health goals. Please review the following plan we discussed and let me know if I can assist you in the future.   These are the goals we discussed: Goals    . Blood Pressure < 130/80    . DIET - EAT MORE FRUITS AND VEGETABLES     Aim for 7+ servings (1/2 cup each) daily     . DIET - INCREASE WATER INTAKE     65-80+ fluid ounces daily     . Weight (lb) < 190 lb (86.2 kg)       This is a list of the screening recommended for you and due dates:  Health Maintenance  Topic Date Due  . Mammogram  05/22/2019  . DEXA scan (bone density measurement)  07/22/2021*  . COVID-19 Vaccine (4 - Booster for Pfizer series) 04/14/2021  . Colon Cancer Screening  05/03/2024  . Tetanus Vaccine  06/02/2024  . Flu Shot  Completed  .  Hepatitis C: One time screening is recommended by Center for Disease Control  (CDC) for  adults born from [birth year] through 1963.   Completed  . Pneumonia vaccines  Completed  *Topic was postponed. The date shown is not the original due date.    Ask insurance about shingrix - 2 series shingles - can get at [facility] or [facility]     Zoster Vaccine, Recombinant injection What is this medicine? ZOSTER VACCINE (ZOS ter vak SEEN) is a vaccine used to reduce the risk of getting shingles. This vaccine is not used to treat shingles or nerve pain from shingles. This medicine may be used for other purposes; ask your health care provider or pharmacist if you have questions. COMMON BRAND NAME(S): [facility] What should I tell my health care provider before I take this medicine? They need to know if you have any of these conditions:  cancer  immune system problems  an unusual or allergic reaction to Zoster vaccine, other medications, foods, dyes, or preservatives  pregnant or trying to get pregnant  breast-feeding How should I use this medicine? This vaccine  is injected into a muscle. It is given by a health care provider. A copy of Vaccine Information Statements will be given before each vaccination. Be sure to read this information carefully each time. This sheet may change often. Talk to your health care provider about the use of this vaccine in children. This vaccine is not approved for use in children. Overdosage: If you think you have taken too much of this medicine contact a poison control center or emergency room at once. NOTE: This medicine is only for you. Do not share this medicine with others. What if I miss a dose? Keep appointments for follow-up (booster) doses. It is important not to miss your dose. Call your health care provider if you are unable to keep an appointment. What may interact with this medicine?  medicines that suppress your immune system  medicines to treat cancer  steroid medicines like prednisone or cortisone This list may not describe all possible interactions. Give your health care provider a list of all the medicines, herbs, non-prescription drugs, or dietary supplements you use. Also tell them if you smoke, drink alcohol, or use illegal drugs. Some items may interact with your medicine. What should I watch for while using this medicine? Visit your health care provider regularly. This vaccine, like all vaccines, may not  fully protect everyone. What side effects may I notice from receiving this medicine? Side effects that you should report to your doctor or health care professional as soon as possible:  allergic reactions (skin rash, itching or hives; swelling of the face, lips, or tongue)  trouble breathing Side effects that usually do not require medical attention (report these to your doctor or health care professional if they continue or are bothersome):  chills  headache  fever  nausea  pain, redness, or irritation at site where injected  tiredness  vomiting This list may not describe all  possible side effects. Call your doctor for medical advice about side effects. You may report side effects to FDA at 1-800-FDA-1088. Where should I keep my medicine? This vaccine is only given by a health care provider. It will not be stored at home. NOTE: This sheet is a summary. It may not cover all possible information. If you have questions about this medicine, talk to your doctor, pharmacist, or health care provider.  2021 Elsevier/Gold Standard (2020-01-21 16:23:07)

## 2021-01-23 ENCOUNTER — Other Ambulatory Visit: Payer: Self-pay | Admitting: Adult Health

## 2021-01-23 ENCOUNTER — Encounter: Payer: Self-pay | Admitting: Adult Health

## 2021-01-23 DIAGNOSIS — E079 Disorder of thyroid, unspecified: Secondary | ICD-10-CM

## 2021-01-23 DIAGNOSIS — I10 Essential (primary) hypertension: Secondary | ICD-10-CM

## 2021-01-23 DIAGNOSIS — N183 Chronic kidney disease, stage 3 unspecified: Secondary | ICD-10-CM | POA: Insufficient documentation

## 2021-01-23 DIAGNOSIS — N1831 Chronic kidney disease, stage 3a: Secondary | ICD-10-CM

## 2021-01-23 DIAGNOSIS — E785 Hyperlipidemia, unspecified: Secondary | ICD-10-CM

## 2021-01-23 LAB — CBC WITH DIFFERENTIAL/PLATELET
Absolute Monocytes: 874 cells/uL (ref 200–950)
Basophils Absolute: 50 cells/uL (ref 0–200)
Basophils Relative: 0.6 %
Eosinophils Absolute: 50 cells/uL (ref 15–500)
Eosinophils Relative: 0.6 %
HCT: 44 % (ref 35.0–45.0)
Hemoglobin: 14.7 g/dL (ref 11.7–15.5)
Lymphs Abs: 1520 cells/uL (ref 850–3900)
MCH: 29.8 pg (ref 27.0–33.0)
MCHC: 33.4 g/dL (ref 32.0–36.0)
MCV: 89.1 fL (ref 80.0–100.0)
MPV: 9.6 fL (ref 7.5–12.5)
Monocytes Relative: 10.4 %
Neutro Abs: 5905 cells/uL (ref 1500–7800)
Neutrophils Relative %: 70.3 %
Platelets: 259 10*3/uL (ref 140–400)
RBC: 4.94 10*6/uL (ref 3.80–5.10)
RDW: 12.7 % (ref 11.0–15.0)
Total Lymphocyte: 18.1 %
WBC: 8.4 10*3/uL (ref 3.8–10.8)

## 2021-01-23 LAB — COMPLETE METABOLIC PANEL WITH GFR
AG Ratio: 2 (calc) (ref 1.0–2.5)
ALT: 22 U/L (ref 6–29)
AST: 16 U/L (ref 10–35)
Albumin: 4.8 g/dL (ref 3.6–5.1)
Alkaline phosphatase (APISO): 68 U/L (ref 37–153)
BUN/Creatinine Ratio: 16 (calc) (ref 6–22)
BUN: 17 mg/dL (ref 7–25)
CO2: 26 mmol/L (ref 20–32)
Calcium: 10.1 mg/dL (ref 8.6–10.4)
Chloride: 103 mmol/L (ref 98–110)
Creat: 1.04 mg/dL — ABNORMAL HIGH (ref 0.50–0.99)
GFR, Est African American: 64 mL/min/{1.73_m2} (ref >=60)
GFR, Est Non African American: 56 mL/min/{1.73_m2} — ABNORMAL LOW (ref >=60)
Globulin: 2.4 g/dL (calc) (ref 1.9–3.7)
Glucose, Bld: 93 mg/dL (ref 65–99)
Potassium: 4.6 mmol/L (ref 3.5–5.3)
Sodium: 141 mmol/L (ref 135–146)
Total Bilirubin: 0.4 mg/dL (ref 0.2–1.2)
Total Protein: 7.2 g/dL (ref 6.1–8.1)

## 2021-01-23 LAB — LIPID PANEL
Cholesterol: 216 mg/dL — ABNORMAL HIGH (ref <200)
HDL: 73 mg/dL (ref >=50)
LDL Cholesterol (Calc): 119 mg/dL (calc) — ABNORMAL HIGH
Non-HDL Cholesterol (Calc): 143 mg/dL (calc) — ABNORMAL HIGH (ref <130)
Total CHOL/HDL Ratio: 3 (calc) (ref <5.0)
Triglycerides: 128 mg/dL (ref <150)

## 2021-01-23 LAB — HEMOGLOBIN A1C
Hgb A1c MFr Bld: 6 % of total Hgb — ABNORMAL HIGH (ref <5.7)
Mean Plasma Glucose: 126 mg/dL
eAG (mmol/L): 7 mmol/L

## 2021-01-23 LAB — TSH: TSH: 0.35 mIU/L — ABNORMAL LOW (ref 0.40–4.50)

## 2021-01-23 LAB — MAGNESIUM: Magnesium: 2.1 mg/dL (ref 1.5–2.5)

## 2021-01-23 LAB — VITAMIN D 25 HYDROXY (VIT D DEFICIENCY, FRACTURES): Vit D, 25-Hydroxy: 86 ng/mL (ref 30–100)

## 2021-01-23 MED ORDER — LEVOTHYROXINE SODIUM 100 MCG PO TABS: ORAL_TABLET | ORAL | 3 refills | Status: DC

## 2021-01-23 MED ORDER — ROSUVASTATIN CALCIUM 40 MG PO TABS: ORAL_TABLET | ORAL | 1 refills | Status: DC

## 2021-01-26 ENCOUNTER — Other Ambulatory Visit: Payer: Self-pay | Admitting: Adult Health

## 2021-02-07 DIAGNOSIS — Z1231 Encounter for screening mammogram for malignant neoplasm of breast: Secondary | ICD-10-CM

## 2021-02-12 ENCOUNTER — Other Ambulatory Visit: Payer: Self-pay | Admitting: Adult Health

## 2021-02-12 MED ORDER — BISOPROLOL-HYDROCHLOROTHIAZIDE 2.5-6.25 MG PO TABS: 1.0000 | ORAL_TABLET | Freq: Every day | ORAL | 1 refills | Status: DC

## 2021-02-12 MED ORDER — LEVOTHYROXINE SODIUM 100 MCG PO TABS: ORAL_TABLET | ORAL | 3 refills | Status: DC

## 2021-03-23 ENCOUNTER — Ambulatory Visit
Admission: RE | Admit: 2021-03-23 | Discharge: 2021-03-23 | Disposition: A | Discharge status: Home / Self Care | Payer: Medicare Other | Source: Ambulatory Visit | Attending: Internal Medicine | Admitting: Internal Medicine

## 2021-03-23 ENCOUNTER — Other Ambulatory Visit: Payer: Self-pay

## 2021-03-23 DIAGNOSIS — Z1231 Encounter for screening mammogram for malignant neoplasm of breast: Secondary | ICD-10-CM

## 2021-03-25 MED ORDER — TELMISARTAN 80 MG PO TABS: ORAL_TABLET | ORAL | 3 refills | Status: DC

## 2021-03-28 ENCOUNTER — Other Ambulatory Visit: Payer: Self-pay | Admitting: Internal Medicine

## 2021-03-28 DIAGNOSIS — R928 Other abnormal and inconclusive findings on diagnostic imaging of breast: Secondary | ICD-10-CM

## 2021-04-17 ENCOUNTER — Other Ambulatory Visit: Payer: Self-pay | Admitting: Internal Medicine

## 2021-04-17 ENCOUNTER — Ambulatory Visit
Admission: RE | Admit: 2021-04-17 | Discharge: 2021-04-17 | Disposition: A | Discharge status: Home / Self Care | Payer: Medicare Other | Source: Ambulatory Visit | Attending: Internal Medicine | Admitting: Internal Medicine

## 2021-04-17 ENCOUNTER — Ambulatory Visit: Admission: RE | Admit: 2021-04-17 | Payer: Medicare Other | Source: Ambulatory Visit

## 2021-04-17 ENCOUNTER — Ambulatory Visit: Payer: Medicare Other

## 2021-04-17 ENCOUNTER — Other Ambulatory Visit: Payer: Self-pay

## 2021-04-17 DIAGNOSIS — R928 Other abnormal and inconclusive findings on diagnostic imaging of breast: Secondary | ICD-10-CM

## 2021-04-17 DIAGNOSIS — R921 Mammographic calcification found on diagnostic imaging of breast: Secondary | ICD-10-CM | POA: Diagnosis not present

## 2021-05-30 ENCOUNTER — Other Ambulatory Visit: Payer: Self-pay | Admitting: Adult Health

## 2021-05-30 MED ORDER — BUPROPION HCL ER (XL) 150 MG PO TB24: 150.0000 mg | ORAL_TABLET | ORAL | 0 refills | Status: DC

## 2021-05-30 MED ORDER — TOPIRAMATE 25 MG PO TABS: ORAL_TABLET | ORAL | 0 refills | Status: DC

## 2021-07-19 NOTE — Progress Notes (Deleted)
Complete Physical  Assessment and Plan:  Encounter for routine physical exam without abnormal findings Due mammogram/DEXA - patient will schedule once on medicare later this year Given # for [facility],   Essential hypertension - resume full dose telmisartan***, follow up sooner if having recurrent tingling or remains above goal and will change meds monitor at home if greater than 130/80, DASH diet, exercise and monitor at home.  - CBC with Differential/Platelet - CMP/GFR - Urinalysis, Routine w reflex microscopic (not at [facility]) - Microalbumin / creatinine urine ratio - EKG 12-Lead  Abnormal glucose Discussed general issues about diabetes pathophysiology and management., Educational material distributed., Suggested low cholesterol diet., Encouraged aerobic exercise., Discussed foot care., Reminded to get yearly retinal exam. - Check A1C  Hypotyroidism -check TSH level, continue medications the same, reminded to take on an empty stomach 30-32mns before food.   Hyperlipidemia -continue medications, check lipids, decrease fatty foods, increase activity.  - Lipid panel  Vitamin D deficiency - Vit D  25 hydroxy (rtn osteoporosis monitoring)  Medication management - Magnesium  Depression, remission In remission off of medications at this time stress management techniques discussed, increase water, good sleep hygiene discussed, increase exercise, and increase veggies.   Gastroesophageal reflux disease without esophagitis - continue PRN OTC H2i/tums  NASH (nonalcoholic steatohepatitis) Check labs, avoid tylenol, alcohol, weight loss advised.   History of basal cell cancer Patient will schedule follow up Dr. TDenna Haggard**  Obesity Long discussion about weight loss, diet, and exercise Discussed final goal weight and current weight loss goal (<190lb) Patient will work on cut down on snacking, increase exercise Patient has been on phentermine with benefit and no SE, taking drug breaks;  restart; will try to topamax in the evening Discussed need for increased fiber intake, avoid keeping bad snacks in home continue close follow up.   Discussed med's effects and SE's. Screening labs and tests as requested with regular follow-up as recommended. Future Appointments  Date Time Provider DMarkesan 07/23/2021  2:00 PM MMagda Bernheim NP [facility] None  01/23/2022  2:30 PM CLiane Comber NP [facility] None    HPI 70y.o. female  presents for a complete physical. She has Essential hypertension; Hyperlipidemia; Thyroid disease; Other abnormal glucose (prediabetes); GERD (gastroesophageal reflux disease); Depression, major, in remission ([place]; Vitamin D deficiency; Medication management; Stress incontinence; NASH (nonalcoholic steatohepatitis); History of basal cell cancer; Obesity (BMI 30.0-34.9); Idiopathic hematuria; and CKD (chronic kidney disease) stage 3, GFR 30-59 ml/min (HCC) on their problem list.   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   She is no longer following up with Dr. HPhilis Pique s/p hysterectomy, on estrace 0.5 mg taking 1/2 tab daily. Denies recent abnormal PAPs.   Hx of major depression in remission.   BMI is There is no height or weight on file to calculate BMI., she is working on diet and exercise, [facility] to do exercises classes, admits diet is not at good in the last several months, can't seem to stop eating, would like to restart phentermine She does have prediabetes, hyperlipidemia and fatty liver per [place].  Wt Readings from Last 3 Encounters:  01/22/21 203 lb (92.1 kg)  11/29/20 202 lb 3.2 oz (91.7 kg)  11/01/20 203 lb (92.1 kg)   Her blood pressure has been running 140s/80s, has been cutting telmisartan in 1/2 as she thought was causing tingling in feet, improved since reducing dose, today their BP is  She does workout. She denies chest pain, shortness of breath,  dizziness.   She is on cholesterol medication, rosuvastatin 20 mg daily  and denies myalgias. Her LDL cholesterol is at goal. The cholesterol last visit was:   Lab Results  Component Value Date   CHOL 216 (H) 01/22/2021   HDL 73 01/22/2021   LDLCALC 119 (H) 01/22/2021   TRIG 128 01/22/2021   CHOLHDL 3.0 01/22/2021   She denies Dm polys. Last A1C in the office was:  Lab Results  Component Value Date   HGBA1C 6.0 (H) 01/22/2021    Last GFR:  Lab Results  Component Value Date   GFRNONAA 56 (L) 01/22/2021   [name], on alternates taking 4000 and 2000 IU daily. Lab Results  Component Value Date   VD25OH 78 01/22/2021     She is on thyroid medication. Her medication was changed last visit. Patient denies nervousness, palpitations and weight changes.  She takes 100 mcg 1 tab daily except Sundays not taking anything  Lab Results  Component Value Date   TSH 0.35 (L) 01/22/2021      Current Medications:  Current Outpatient Medications on File Prior to Visit  Medication Sig   bisoprolol-hydrochlorothiazide (ZIAC) 2.5-6.25 MG tablet Take 1 tablet by mouth daily.   buPROPion (WELLBUTRIN XL) 150 MG 24 hr tablet Take 1 tablet (150 mg total) by mouth every morning.   Cholecalciferol (VITAMIN D) 50 MCG (2000 UT) CAPS Take 2 capsules (4,000 Units total) by mouth daily.   levothyroxine (SYNTHROID) 100 MCG tablet TAKE 1/2-1 TABLET BY MOUTH EVERY MORNING AS DIRECTED; Take 1/2 tab MWF, whole tab all other days. WITH ONLY GLASS OF WATER FOR 30 MINUTES   Magnesium 500 MG TABS Take 1 tablet by mouth daily.   Multiple Vitamins-Minerals (MULTIVITAMIN PO) Take by mouth daily.   OVER THE COUNTER MEDICATION Tumeric daily   phentermine (ADIPEX-P) 37.5 MG tablet Take 1 tablet (37.5 mg total) by mouth daily before breakfast. (Patient not taking: Reported on 01/22/2021)   Potassium 75 MG TABS Take by mouth.   rosuvastatin (CRESTOR) 40 MG tablet Take     1 tablet     Daily       for Cholesterol   telmisartan (MICARDIS) 80 MG tablet Take 1/2 tablet Daily for BP   topiramate (TOPAMAX) 25 MG tablet TAKE  1 TABLET BY MOUTH EVERY NIGHT AT [facility] TIME.   No current facility-administered medications on file prior to visit.   Health Maintenance:   Immunization History  Administered Date(s) Administered   Influenza-Unspecified 09/30/2015, 09/29/2016, 10/12/2019, 09/28/2020   MMR 05/30/2014   PFIZER(Purple Top)SARS-COV-2 Vaccination 02/24/2020, 03/21/2020, 10/14/2020   Pneumococcal Conjugate-13 07/06/2018   Pneumococcal Polysaccharide-23 03/25/2006, 07/07/2019   Td 03/15/2004   Tdap 06/02/2014   Tetanus: 2015 Pneumovax: 2007, 2020 Prevnar 13: 2019 Flu vaccine: 09/28/20 Zostavax: *** Covid 19: Pfizer 02/24/20, 03/21/20, 10/14/20  Pap: 2018 -s/p total hysterectomy, Dr. Philis Pique. DONE MGM: 02/2021 calcifications left breast , 04/17/21 spot compression benign F/U 1 year DEXA: 2007 via GYN -***  Colonoscopy: 04/2014 due 2025   Vision: Dr. Coral Ceo, Dr. Clydene Laming, last visit 2021 Dental: Dr. Posey Pronto, last visit 2021, goes q31mSkin: hx of BCC, Dr. TDenna Haggard 2019   Patient Care Team: MUnk Pinto MD as PCP - General (Internal Medicine) GMelissa Noon [place] Referring Physician (Optometry) TLavonna Monarch MD as Consulting Physician (Dermatology) KInda Castle MD (Inactive) as Consulting Physician (Gastroenterology) HBobbye Charleston MD as Consulting Physician (Obstetrics and  Gynecology) Elsie Saas, MD as Consulting Physician (Orthopedic Surgery)  Medical History:  Past Medical History:  Diagnosis Date   Anemia    Anxiety    Basal cell carcinoma 08/17/2007   left nostril MOHS   BCC (basal cell carcinoma of skin) 04/06/2013   right nose tx cx3 14f   [place]cer [place]    basal cell CA- nose   Colon polyp, hyperplastic    2005   Depression    GERD (gastroesophageal reflux disease)    Hyperlipidemia    Prediabetes    Thyroid disease    Unspecified essential  hypertension    Vitamin D deficiency    Allergies Allergies  Allergen Reactions   Naldecon Senior [Guaifenesin]     Unsure of reaction   Prednisone     Unsure of reaction   Benazepril Hcl Other (See Comments) and Cough    Not effective for pt Not effective for pt Not effective for pt    SURGICAL HISTORY She  has a past surgical history that includes Tonsillectomy; Thyroidectomy; Vaginal hysterectomy (1994); Breast lumpectomy (Right); Colonoscopy; Mohs surgery; Knee arthroscopy (Right, 2000); Breast excisional biopsy (Left, 2002); and Cataract extraction, bilateral (Bilateral, 07/2018). FAMILY HISTORY Her family history includes Alzheimer's disease in her father; Atrial fibrillation in her sister; Diabetes type II in her sister; Hypertension in her father and mother; Valvular heart disease in her mother. SOCIAL HISTORY She  reports that she quit smoking about 16 years ago. Her smoking use included cigarettes. She has a 2.50 pack-year smoking history. She has never used smokeless tobacco. She reports current alcohol use of about 3.0 standard drinks of alcohol per week. She reports that she does not use drugs.   Review of Systems  Constitutional: Negative.  Negative for chills, fever, malaise/fatigue and weight loss.  HENT: Negative.  Negative for congestion, hearing loss, sinus pain, sore throat and tinnitus.   Eyes: Negative.  Negative for blurred vision and double vision.  Respiratory: Negative.  Negative for cough, hemoptysis, sputum production, shortness of breath and wheezing.   Cardiovascular: Negative.  Negative for chest pain, palpitations, orthopnea, claudication, leg swelling and PND.  Gastrointestinal:  Negative for abdominal pain, blood in stool, constipation, diarrhea, heartburn, melena, nausea and vomiting.  Genitourinary: Negative.  Negative for dysuria and urgency.  Musculoskeletal:  Negative for back pain, falls, joint pain, myalgias and neck pain.  Skin: Negative.   Negative for rash.  Neurological: Negative.  Negative for dizziness, tingling, tremors, sensory change, weakness and headaches.  Endo/Heme/Allergies: Negative.  Negative for polydipsia. Does not bruise/bleed easily.  Psychiatric/Behavioral: Negative.  Negative for depression, memory loss, substance abuse and suicidal ideas. The patient is not nervous/anxious and does not have insomnia.   All other systems reviewed and are negative.   Physical Exam: Estimated body mass index is 30.64 kg/m as calculated from the following:   Height as of 07/12/20: 5' 8.25" (1.734 m).   Weight as of 01/22/21: 203 lb (92.1 kg). There were no vitals taken for this visit.   General Appearance: Well nourished, in no apparent distress. Eyes: PERRLA, EOMs, conjunctiva no swelling or erythema Sinuses: No Frontal/maxillary tenderness ENT/Mouth: Ext aud canals clear, normal light reflex with TMs without erythema, bulging.  Good dentition. No erythema, swelling, or exudate on post pharynx. Tonsils not swollen or erythematous. Hearing normal.  Neck: Supple, thyroid normal. No bruits Respiratory: Respiratory effort normal, BS equal bilaterally without rales, rhonchi, wheezing or stridor. Cardio: RRR without murmurs, rubs or gallops. Brisk peripheral pulses without  edema.  Chest: symmetric, with normal excursions and percussion. Breasts: Breasts: breasts appear normal, no suspicious masses, no skin or nipple changes or axillary nodes, fibrous and dense texture throughout bilaterally . Abdomen: Soft, +BS, non tender, negative murphy's, no guarding, rebound, hernias, masses, or organomegaly. .  Lymphatics: Non tender without lymphadenopathy.  Genitourinary: defer to GYN Musculoskeletal: Full ROM all peripheral extremities,5/5 strength, and normal gait. Skin: Warm, dry without rashes, concerning lesions, ecchymosis.  Neuro: Cranial nerves intact, reflexes equal bilaterally. Normal muscle tone, no cerebellar symptoms.  Sensation intact.  Psych: Awake and oriented X 3, normal affect, Insight and Judgment appropriate.   EKG: WNL   The patient's weight, height, BMI, and visual acuity have been recorded in the chart.  I have made referrals, counseling, and provided education to the patient based on review of the above and I have provided the patient with a written personalized care plan for preventive services.    Magda Bernheim, NP 10:09 AM [facility]

## 2021-07-23 ENCOUNTER — Encounter: Payer: Self-pay | Admitting: Nurse Practitioner

## 2021-07-23 DIAGNOSIS — Z0001 Encounter for general adult medical examination with abnormal findings: Secondary | ICD-10-CM

## 2021-07-23 DIAGNOSIS — E785 Hyperlipidemia, unspecified: Secondary | ICD-10-CM

## 2021-07-23 DIAGNOSIS — E669 Obesity, unspecified: Secondary | ICD-10-CM

## 2021-07-23 DIAGNOSIS — K7581 Nonalcoholic steatohepatitis (NASH): Secondary | ICD-10-CM

## 2021-07-23 DIAGNOSIS — I10 Essential (primary) hypertension: Secondary | ICD-10-CM

## 2021-07-23 DIAGNOSIS — E559 Vitamin D deficiency, unspecified: Secondary | ICD-10-CM

## 2021-07-23 DIAGNOSIS — E079 Disorder of thyroid, unspecified: Secondary | ICD-10-CM

## 2021-07-23 DIAGNOSIS — Z79899 Other long term (current) drug therapy: Secondary | ICD-10-CM

## 2021-07-23 DIAGNOSIS — R7309 Other abnormal glucose: Secondary | ICD-10-CM

## 2021-07-23 DIAGNOSIS — Z136 Encounter for screening for cardiovascular disorders: Secondary | ICD-10-CM

## 2021-07-23 DIAGNOSIS — Z1389 Encounter for screening for other disorder: Secondary | ICD-10-CM

## 2021-07-23 DIAGNOSIS — N1831 Chronic kidney disease, stage 3a: Secondary | ICD-10-CM

## 2021-07-23 DIAGNOSIS — K219 Gastro-esophageal reflux disease without esophagitis: Secondary | ICD-10-CM

## 2021-07-23 DIAGNOSIS — E039 Hypothyroidism, unspecified: Secondary | ICD-10-CM

## 2021-07-23 DIAGNOSIS — F325 Major depressive disorder, single episode, in full remission: Secondary | ICD-10-CM

## 2021-08-02 NOTE — Progress Notes (Signed)
Complete Physical  Assessment and Plan:  Encounter for routine physical exam without abnormal findings Mammogram due 02/2022 and wants to have DEXA at same time  Essential hypertension - Taking telmisartan 80 mg 1/2 tab QD and Ziac 2.5/6.25 QD - Monitor at home if greater than 130/80, DASH diet, exercise and monitor at home.  - CBC with Differential/Platelet - CMP/GFR - Urinalysis, Routine w reflex microscopic  - Microalbumin / creatinine urine ratio  Abnormal glucose Discussed general issues about diabetes pathophysiology and management., Educational material distributed., Suggested low cholesterol diet., Encouraged aerobic exercise., Discussed foot care., Reminded to get yearly retinal exam. - Check A1C  Hypothyroidism -check TSH level, continue medications the same, reminded to take on an empty stomach 30-18mns before food. Currently 1039m 1 tab 4 days a week and 1/2 tab M,W, F  Hyperlipidemia -continue medications, check lipids, decrease fatty foods, increase activity.  - Lipid panel  Vitamin D deficiency - Vit D  25 hydroxy (rtn osteoporosis monitoring) - Continue Supplementation , taking Vit D3 5000 units 2 tabs daily  Medication management - Magnesium  Depression, remission -Taking Wellbutrin 30074mD  Gastroesophageal reflux disease without esophagitis - continue PRN TUMS  NASH (nonalcoholic steatohepatitis) Check labs, avoid tylenol, alcohol, weight loss advised.   History of basal cell cancer Patient will schedule follow up Dr. TafDenna Haggardue for skin check  Obesity Long discussion about weight loss, diet, and exercise Discussed final goal weight and current weight loss goal (<190lb) Patient will work on cut down on snacking, increase exercise Patient has used Phentermine in the past, had been off due to depression symptoms but wants to restart, as well as Topamax Discussed need for increased fiber intake, avoid keeping bad snacks in home continue close  follow up.   Discussed med's effects and SE's. Screening labs and tests as requested with regular follow-up as recommended. Future Appointments  Date Time Provider DepValhalla/25/2023  2:30 PM CorLiane ComberP [facility] None  08/06/2022  3:00 PM MulMagda BernheimP [facility] None    HPI [age]o. female  presents for a complete physical. She has Essential hypertension; Hyperlipidemia; Thyroid disease; Other abnormal glucose (prediabetes); GERD (gastroesophageal reflux disease); Depression, major, in remission ([place]itamin D deficiency; Medication management; Stress incontinence; NASH (nonalcoholic steatohepatitis); History of basal cell cancer; Obesity (BMI 30.0-34.9); Idiopathic hematuria; and CKD (chronic kidney disease) stage 3, GFR 30-59 ml/min (HCC) on their problem list.   Married, no children, pt is retired!  She is no longer following up with Dr. HorPhilis Pique/p hysterectomy.Denies recent abnormal PAPs.   Hx of major depression in remission and is currently on Wellbutrin 300m41m and Topamax 25 mg and symptoms have improved.   BMI is Body mass index is 30.34 kg/m., she is working on diet and exercise, [facility] to do exercises classes, admits diet is not at good in the last several months,appetite is much more control. Wants to retry phentermine now that depression is under control. She does have prediabetes, hyperlipidemia and fatty liver per [place].  Wt Readings from Last 3 Encounters:  08/06/21 201 lb (91.2 kg)  01/22/21 203 lb (92.1 kg)  11/29/20 202 lb 3.2 oz (91.7 kg)   Her blood pressure has been running 140s/80s, Taking Telmisartan 80 mg daily their BP is BP: (!) 148/72 She does workout. She denies chest pain, shortness of breath, dizziness.   She is on cholesterol medication, rosuvastatin 20 mg daily  and denies  myalgias. Her LDL cholesterol is not at goal. The cholesterol last visit was:   Lab Results  Component Value Date   CHOL 216 (H)  01/22/2021   HDL 73 01/22/2021   LDLCALC 119 (H) 01/22/2021   TRIG 128 01/22/2021   CHOLHDL 3.0 01/22/2021   She denies Dm polys. Last A1C in the office was:  Lab Results  Component Value Date   HGBA1C 6.0 (H) 01/22/2021    Last GFR:  Lab Results  Component Value Date   GFRNONAA 56 (L) 01/22/2021   [name], 5000 units  2 tabsdaily Lab Results  Component Value Date   VD25OH 86 01/22/2021     She is on thyroid medication.  She takes 100 mcg 1 tab daily  4 days a week and 1/2 tab M, W, F Lab Results  Component Value Date   TSH 0.35 (L) 01/22/2021      Current Medications:  Current Outpatient Medications on File Prior to Visit  Medication Sig   bisoprolol-hydrochlorothiazide (ZIAC) 2.5-6.25 MG tablet Take 1 tablet by mouth daily.   Cholecalciferol (VITAMIN D) 50 MCG (2000 UT) CAPS Take 2 capsules (4,000 Units total) by mouth daily. (Patient taking differently: Take 4,000 Units by mouth daily. Taking 10,000 units)   levothyroxine (SYNTHROID) 100 MCG tablet TAKE 1/2-1 TABLET BY MOUTH EVERY MORNING AS DIRECTED; Take 1/2 tab MWF, whole tab all other days. WITH ONLY GLASS OF WATER FOR 30 MINUTES   Magnesium 500 MG TABS Take 1 tablet by mouth daily.   Multiple Vitamins-Minerals (MULTIVITAMIN PO) Take by mouth daily.   OVER THE COUNTER MEDICATION Tumeric daily   Potassium 75 MG TABS Take by mouth.   rosuvastatin (CRESTOR) 40 MG tablet Take     1 tablet     Daily      for Cholesterol   telmisartan (MICARDIS) 80 MG tablet Take 1/2 tablet Daily for BP   topiramate (TOPAMAX) 25 MG tablet TAKE  1 TABLET BY MOUTH EVERY NIGHT AT [facility] TIME.   No current facility-administered medications on file prior to visit.    Tetanus: 2015 Pneumovax: 2007, 2020 Prevnar 13: 2019 Flu vaccine: 09/28/20 Zostavax: has not yet because not covered by insurance Covid 19: [place] 02/24/20, 03/21/20, 10/14/20  Pap: 2018 -s/p total hysterectomy, Dr. Philis Pique. DONE MGM: 02/2021  calcifications left breast , 04/17/21 spot compression benign F/U 1 year DEXA: 2007 via GYN - will plan to due with next mammogram  Colonoscopy: 04/2014 due 2025   Vision: Dr. Clydene Laming, last visit 2021 Dental: Dr. Posey Pronto, last visit 2021, goes q13mSkin: hx of BCC, Dr. TDenna Haggard 2019 , due for skin check  Patient Care Team: MUnk Pinto MD as PCP - General (Internal Medicine) GMelissa Noon [place] Referring Physician (Optometry) TLavonna Monarch MD as Consulting Physician (Dermatology) KInda Castle MD (Inactive) as Consulting Physician (Gastroenterology) HBobbye Charleston MD as Consulting Physician (Obstetrics and Gynecology) WElsie Saas MD as Consulting Physician (Orthopedic Surgery)  Medical History:  Past Medical History:  Diagnosis Date   Anemia    Anxiety    Basal cell carcinoma 08/17/2007   left nostril MOHS   BCC (basal cell carcinoma of skin) 04/06/2013   right nose tx cx3 528f  Cancer ([place]   basal cell CA- nose   Colon polyp, hyperplastic    2005   Depression    GERD (gastroesophageal reflux disease)    Hyperlipidemia    Prediabetes    Thyroid disease  Unspecified essential hypertension    Vitamin D deficiency    Allergies Allergies  Allergen Reactions   Naldecon Senior [Guaifenesin]     Unsure of reaction   Prednisone     Unsure of reaction   Benazepril Hcl Other (See Comments) and Cough    Not effective for pt Not effective for pt Not effective for pt    SURGICAL HISTORY She  has a past surgical history that includes Tonsillectomy; Thyroidectomy; Vaginal hysterectomy (1994); Breast lumpectomy (Right); Colonoscopy; Mohs surgery; Knee arthroscopy (Right, 2000); Breast excisional biopsy (Left, 2002); and Cataract extraction, bilateral (Bilateral, 07/2018). FAMILY HISTORY Her family history includes Alzheimer's disease in her father; Atrial fibrillation in her sister; Diabetes type II in her sister; Hypertension in her father and mother; Valvular  heart disease in her mother. SOCIAL HISTORY She  reports that she quit smoking about 16 years ago. Her smoking use included cigarettes. She has a 2.50 pack-year smoking history. She has never used smokeless tobacco. She reports current alcohol use of about 3.0 standard drinks of alcohol per week. She reports that she does not use drugs.   Review of Systems  Constitutional: Negative.  Negative for chills, fever, malaise/fatigue and weight loss.  HENT: Negative.  Negative for congestion, hearing loss, sinus pain, sore throat and tinnitus.   Eyes: Negative.  Negative for blurred vision and double vision.  Respiratory: Negative.  Negative for cough, hemoptysis, sputum production, shortness of breath and wheezing.   Cardiovascular: Negative.  Negative for chest pain, palpitations, orthopnea, claudication, leg swelling and PND.  Gastrointestinal:  Positive for constipation (occ relieved with fiber) and heartburn (occ relieved with TUMS). Negative for abdominal pain, blood in stool, diarrhea, melena, nausea and vomiting.  Genitourinary: Negative.  Negative for dysuria and urgency.  Musculoskeletal:  Negative for back pain, falls, joint pain, myalgias and neck pain.  Skin: Negative.  Negative for rash.  Neurological: Negative.  Negative for dizziness, tingling, tremors, sensory change, weakness and headaches.  Endo/Heme/Allergies: Negative.  Negative for polydipsia. Does not bruise/bleed easily.  Psychiatric/Behavioral:  Positive for depression (improved with medication). Negative for memory loss, substance abuse and suicidal ideas. The patient is not nervous/anxious and does not have insomnia.   All other systems reviewed and are negative.  Physical Exam: Estimated body mass index is 30.34 kg/m as calculated from the following:   Height as of 07/12/20: 5' 8.25" (1.734 m).   Weight as of this encounter: 201 lb (91.2 kg). BP (!) 148/72   Pulse 75   Temp 97.7 F (36.5 C)   Wt 201 lb (91.2 kg)    SpO2 96%   BMI 30.34 kg/m    General Appearance: Well nourished, in no apparent distress. Eyes: PERRLA, EOMs, conjunctiva no swelling or erythema Sinuses: No Frontal/maxillary tenderness ENT/Mouth: Ext aud canals clear, normal light reflex with TMs without erythema, bulging.  Good dentition. No erythema, swelling, or exudate on post pharynx. Tonsils not swollen or erythematous. Hearing normal.  Neck: Supple, thyroid normal. No bruits Respiratory: Respiratory effort normal, BS equal bilaterally without rales, rhonchi, wheezing or stridor. Cardio: RRR without murmurs, rubs or gallops. Brisk peripheral pulses without edema.  Chest: symmetric, with normal excursions and percussion. Breasts:Defer to GYN Abdomen: Soft, +BS, non tender, negative murphy's, no guarding, rebound, hernias, masses, or organomegaly. .  Lymphatics: Non tender without lymphadenopathy.  Genitourinary: defer to GYN Musculoskeletal: Full ROM all peripheral extremities,5/5 strength, and normal gait. Skin: Warm, dry without rashes, concerning lesions, ecchymosis.  Neuro: Cranial nerves intact, reflexes  equal bilaterally. Normal muscle tone, no cerebellar symptoms. Sensation intact.  Psych: Awake and oriented X 3, normal affect, Insight and Judgment appropriate.    The patient's weight, height, BMI, and visual acuity have been recorded in the chart.  I have made referrals, counseling, and provided education to the patient based on review of the above and I have provided the patient with a written personalized care plan for preventive services.    Magda Bernheim, NP 3:58 PM [facility]

## 2021-08-06 ENCOUNTER — Encounter: Payer: Self-pay | Admitting: Nurse Practitioner

## 2021-08-06 ENCOUNTER — Other Ambulatory Visit: Payer: Self-pay

## 2021-08-06 ENCOUNTER — Ambulatory Visit (INDEPENDENT_AMBULATORY_CARE_PROVIDER_SITE_OTHER): Payer: Medicare Other | Admitting: Nurse Practitioner

## 2021-08-06 VITALS — BP 148/72 | HR 75 | Temp 97.7°F | Wt 201.0 lb

## 2021-08-06 DIAGNOSIS — I10 Essential (primary) hypertension: Secondary | ICD-10-CM | POA: Diagnosis not present

## 2021-08-06 DIAGNOSIS — Z0000 Encounter for general adult medical examination without abnormal findings: Secondary | ICD-10-CM | POA: Diagnosis not present

## 2021-08-06 DIAGNOSIS — Z0001 Encounter for general adult medical examination with abnormal findings: Secondary | ICD-10-CM

## 2021-08-06 DIAGNOSIS — E559 Vitamin D deficiency, unspecified: Secondary | ICD-10-CM

## 2021-08-06 DIAGNOSIS — E039 Hypothyroidism, unspecified: Secondary | ICD-10-CM

## 2021-08-06 DIAGNOSIS — F325 Major depressive disorder, single episode, in full remission: Secondary | ICD-10-CM

## 2021-08-06 DIAGNOSIS — N1831 Chronic kidney disease, stage 3a: Secondary | ICD-10-CM

## 2021-08-06 DIAGNOSIS — R7309 Other abnormal glucose: Secondary | ICD-10-CM

## 2021-08-06 DIAGNOSIS — Z79899 Other long term (current) drug therapy: Secondary | ICD-10-CM

## 2021-08-06 DIAGNOSIS — Z136 Encounter for screening for cardiovascular disorders: Secondary | ICD-10-CM

## 2021-08-06 DIAGNOSIS — E785 Hyperlipidemia, unspecified: Secondary | ICD-10-CM | POA: Diagnosis not present

## 2021-08-06 DIAGNOSIS — K7581 Nonalcoholic steatohepatitis (NASH): Secondary | ICD-10-CM

## 2021-08-06 DIAGNOSIS — E669 Obesity, unspecified: Secondary | ICD-10-CM

## 2021-08-06 DIAGNOSIS — Z1389 Encounter for screening for other disorder: Secondary | ICD-10-CM

## 2021-08-06 DIAGNOSIS — K219 Gastro-esophageal reflux disease without esophagitis: Secondary | ICD-10-CM

## 2021-08-06 MED ORDER — BUPROPION HCL ER (XL) 300 MG PO TB24: 300.0000 mg | ORAL_TABLET | ORAL | 3 refills | Status: DC

## 2021-08-06 MED ORDER — PHENTERMINE HCL 37.5 MG PO TABS: ORAL_TABLET | ORAL | 2 refills | Status: DC

## 2021-08-06 NOTE — Patient Instructions (Signed)

## 2021-08-07 LAB — COMPLETE METABOLIC PANEL WITH GFR
AG Ratio: 1.9 (calc) (ref 1.0–2.5)
ALT: 38 U/L — ABNORMAL HIGH (ref 6–29)
AST: 21 U/L (ref 10–35)
Albumin: 4.7 g/dL (ref 3.6–5.1)
Alkaline phosphatase (APISO): 68 U/L (ref 37–153)
BUN/Creatinine Ratio: 18 (calc) (ref 6–22)
BUN: 20 mg/dL (ref 7–25)
CO2: 28 mmol/L (ref 20–32)
Calcium: 10.2 mg/dL (ref 8.6–10.4)
Chloride: 105 mmol/L (ref 98–110)
Creat: 1.12 mg/dL — ABNORMAL HIGH (ref 0.50–1.05)
Globulin: 2.5 g/dL (calc) (ref 1.9–3.7)
Glucose, Bld: 104 mg/dL — ABNORMAL HIGH (ref 65–99)
Potassium: 4 mmol/L (ref 3.5–5.3)
Sodium: 143 mmol/L (ref 135–146)
Total Bilirubin: 0.3 mg/dL (ref 0.2–1.2)
Total Protein: 7.2 g/dL (ref 6.1–8.1)
eGFR: 54 mL/min/{1.73_m2} — ABNORMAL LOW (ref >=60)

## 2021-08-07 LAB — TSH: TSH: 0.9 mIU/L (ref 0.40–4.50)

## 2021-08-07 LAB — URINALYSIS, ROUTINE W REFLEX MICROSCOPIC
Bacteria, UA: NONE SEEN /HPF
Bilirubin Urine: NEGATIVE
Glucose, UA: NEGATIVE
Hyaline Cast: NONE SEEN /LPF
Ketones, ur: NEGATIVE
Leukocytes,Ua: NEGATIVE
Nitrite: NEGATIVE
Protein, ur: NEGATIVE
RBC / HPF: NONE SEEN /HPF (ref 0–2)
Specific Gravity, Urine: 1.016 (ref 1.001–1.035)
Squamous Epithelial / HPF: NONE SEEN /HPF (ref <=5)
WBC, UA: NONE SEEN /HPF (ref 0–5)
pH: 7 (ref 5.0–8.0)

## 2021-08-07 LAB — LIPID PANEL
Cholesterol: 198 mg/dL (ref <200)
HDL: 74 mg/dL (ref >=50)
LDL Cholesterol (Calc): 100 mg/dL (calc) — ABNORMAL HIGH
Non-HDL Cholesterol (Calc): 124 mg/dL (calc) (ref <130)
Total CHOL/HDL Ratio: 2.7 (calc) (ref <5.0)
Triglycerides: 143 mg/dL (ref <150)

## 2021-08-07 LAB — CBC WITH DIFFERENTIAL/PLATELET
Absolute Monocytes: 847 cells/uL (ref 200–950)
Basophils Absolute: 58 cells/uL (ref 0–200)
Basophils Relative: 0.7 %
Eosinophils Absolute: 91 cells/uL (ref 15–500)
Eosinophils Relative: 1.1 %
HCT: 41 % (ref 35.0–45.0)
Hemoglobin: 13.8 g/dL (ref 11.7–15.5)
Lymphs Abs: 1536 cells/uL (ref 850–3900)
MCH: 30.9 pg (ref 27.0–33.0)
MCHC: 33.7 g/dL (ref 32.0–36.0)
MCV: 91.9 fL (ref 80.0–100.0)
MPV: 10.1 fL (ref 7.5–12.5)
Monocytes Relative: 10.2 %
Neutro Abs: 5769 cells/uL (ref 1500–7800)
Neutrophils Relative %: 69.5 %
Platelets: 243 10*3/uL (ref 140–400)
RBC: 4.46 10*6/uL (ref 3.80–5.10)
RDW: 12.3 % (ref 11.0–15.0)
Total Lymphocyte: 18.5 %
WBC: 8.3 10*3/uL (ref 3.8–10.8)

## 2021-08-07 LAB — HEMOGLOBIN A1C
Hgb A1c MFr Bld: 5.6 % of total Hgb (ref <5.7)
Mean Plasma Glucose: 114 mg/dL
eAG (mmol/L): 6.3 mmol/L

## 2021-08-07 LAB — MAGNESIUM: Magnesium: 2.1 mg/dL (ref 1.5–2.5)

## 2021-08-07 LAB — MICROALBUMIN / CREATININE URINE RATIO
Creatinine, Urine: 101 mg/dL (ref 20–275)
Microalb Creat Ratio: 7 mcg/mg creat (ref <30)
Microalb, Ur: 0.7 mg/dL

## 2021-08-07 LAB — MICROSCOPIC MESSAGE

## 2021-08-07 LAB — VITAMIN D 25 HYDROXY (VIT D DEFICIENCY, FRACTURES): Vit D, 25-Hydroxy: 105 ng/mL — ABNORMAL HIGH (ref 30–100)

## 2021-08-26 ENCOUNTER — Other Ambulatory Visit: Payer: Self-pay | Admitting: Adult Health

## 2021-08-26 DIAGNOSIS — I10 Essential (primary) hypertension: Secondary | ICD-10-CM

## 2021-08-26 DIAGNOSIS — E785 Hyperlipidemia, unspecified: Secondary | ICD-10-CM

## 2021-09-07 ENCOUNTER — Other Ambulatory Visit: Payer: Self-pay | Admitting: Adult Health

## 2022-01-22 DIAGNOSIS — M25511 Pain in right shoulder: Secondary | ICD-10-CM | POA: Diagnosis not present

## 2022-01-23 ENCOUNTER — Ambulatory Visit (INDEPENDENT_AMBULATORY_CARE_PROVIDER_SITE_OTHER): Payer: Medicare Other | Admitting: Adult Health

## 2022-01-23 ENCOUNTER — Other Ambulatory Visit: Payer: Self-pay

## 2022-01-23 ENCOUNTER — Encounter: Payer: Self-pay | Admitting: Adult Health

## 2022-01-23 VITALS — BP 126/72 | HR 69 | Temp 97.9°F | Wt 187.0 lb

## 2022-01-23 DIAGNOSIS — R6889 Other general symptoms and signs: Secondary | ICD-10-CM | POA: Diagnosis not present

## 2022-01-23 DIAGNOSIS — F325 Major depressive disorder, single episode, in full remission: Secondary | ICD-10-CM

## 2022-01-23 DIAGNOSIS — Z0000 Encounter for general adult medical examination without abnormal findings: Secondary | ICD-10-CM

## 2022-01-23 DIAGNOSIS — Z79899 Other long term (current) drug therapy: Secondary | ICD-10-CM

## 2022-01-23 DIAGNOSIS — Z0001 Encounter for general adult medical examination with abnormal findings: Secondary | ICD-10-CM | POA: Diagnosis not present

## 2022-01-23 DIAGNOSIS — E559 Vitamin D deficiency, unspecified: Secondary | ICD-10-CM | POA: Diagnosis not present

## 2022-01-23 DIAGNOSIS — R7309 Other abnormal glucose: Secondary | ICD-10-CM | POA: Diagnosis not present

## 2022-01-23 DIAGNOSIS — N183 Chronic kidney disease, stage 3 unspecified: Secondary | ICD-10-CM

## 2022-01-23 DIAGNOSIS — E785 Hyperlipidemia, unspecified: Secondary | ICD-10-CM

## 2022-01-23 DIAGNOSIS — K7581 Nonalcoholic steatohepatitis (NASH): Secondary | ICD-10-CM | POA: Diagnosis not present

## 2022-01-23 DIAGNOSIS — N393 Stress incontinence (female) (male): Secondary | ICD-10-CM

## 2022-01-23 DIAGNOSIS — N029 Recurrent and persistent hematuria with unspecified morphologic changes: Secondary | ICD-10-CM | POA: Diagnosis not present

## 2022-01-23 DIAGNOSIS — E663 Overweight: Secondary | ICD-10-CM

## 2022-01-23 DIAGNOSIS — I10 Essential (primary) hypertension: Secondary | ICD-10-CM | POA: Diagnosis not present

## 2022-01-23 DIAGNOSIS — K219 Gastro-esophageal reflux disease without esophagitis: Secondary | ICD-10-CM

## 2022-01-23 DIAGNOSIS — E669 Obesity, unspecified: Secondary | ICD-10-CM

## 2022-01-23 DIAGNOSIS — Z85828 Personal history of other malignant neoplasm of skin: Secondary | ICD-10-CM | POA: Diagnosis not present

## 2022-01-23 DIAGNOSIS — E079 Disorder of thyroid, unspecified: Secondary | ICD-10-CM | POA: Diagnosis not present

## 2022-01-23 DIAGNOSIS — Z1231 Encounter for screening mammogram for malignant neoplasm of breast: Secondary | ICD-10-CM

## 2022-01-23 DIAGNOSIS — Z13820 Encounter for screening for osteoporosis: Secondary | ICD-10-CM

## 2022-01-23 DIAGNOSIS — E2839 Other primary ovarian failure: Secondary | ICD-10-CM

## 2022-01-23 MED ORDER — PHENTERMINE HCL 37.5 MG PO TABS: ORAL_TABLET | ORAL | 0 refills | Status: DC

## 2022-01-23 NOTE — Progress Notes (Signed)
AWV and follow up  Assessment and Plan:  Annual Medicare Wellness Visit Due annually  Health maintenance reviewed  Mammogram due 4/192023 and wants to have DEXA at same time, order will be faxed to [facility]  Essential hypertension - Continue meds - Monitor at home if greater than 130/80, DASH diet, exercise and monitor at home.   Abnormal glucose (prediabetes) Discussed general issues about diabetes pathophysiology and management., Educational material distributed., Suggested low cholesterol diet., Encouraged aerobic exercise., Discussed foot care., Reminded to get yearly retinal exam. - Check A1C  Hypothyroidism -check TSH level, continue medications the same, reminded to take on an empty stomach 30-18mns before food.   Hyperlipidemia -continue medications, check lipids, decrease fatty foods, increase activity.  - Lipid panel  Vitamin D deficiency - Vit D  25 hydroxy (rtn osteoporosis monitoring) - Continue Supplementation for goal 60-100  Medication management - Magnesium  Depression, remission -Taking Wellbutrin 3044mQD  Gastroesophageal reflux disease without esophagitis - continue PRN TUMS  NASH (nonalcoholic steatohepatitis) Check labs, avoid tylenol, alcohol, weight loss advised.   History of basal cell cancer Patient will schedule follow up Dr. TaDenna Haggardoverdue for skin check  Overweight - BMI 28 Long discussion about weight loss, diet, and exercise Discussed final goal weight and current weight loss goal (<190lb) Patient will work on cut down on snacking, increase exercise Doing well with wellbutrin, intermittent phentermine, regular exercise  Discussed need for increased fiber intake, avoid keeping bad snacks in home continue close follow up.   Orders Placed This Encounter  Procedures   MM Digital Screening   DG Bone Density   CBC with Differential/Platelet   COMPLETE METABOLIC PANEL WITH GFR   Magnesium   Lipid panel   TSH   VITAMIN D 25 Hydroxy  (Vit-D Deficiency, Fractures)    Discussed med's effects and SE's. Screening labs and tests as requested with regular follow-up as recommended. Future Appointments  Date Time Provider [place]/20[place]  3:00 PM MuMagda BernheimNP [facility] None  03/11/2023  2:00 PM CoLiane ComberNP [facility] None     Plan:   During the course of the visit the patient was educated and counseled about appropriate screening and preventive services including:   Pneumococcal vaccine  Prevnar 13 Influenza vaccine Td vaccine Screening electrocardiogram Bone densitometry screening Colorectal cancer screening Diabetes screening Glaucoma screening Nutrition counseling  Advanced directives: requested   HPI [age].o. female  presents for a complete physical. She has Essential hypertension; Hyperlipidemia; Thyroid disease; Other abnormal glucose (prediabetes); GERD (gastroesophageal reflux disease); Depression, major, in remission ([place] Vitamin D deficiency; Medication management; Stress incontinence; NASH (nonalcoholic steatohepatitis); History of basal cell cancer; Overweight (BMI 25.0-29.9); Idiopathic hematuria; and CKD (chronic kidney disease) stage 3, GFR 30-59 ml/min (HCC) on their problem list.   Married, no children, retired.   She is no longer following up with Dr. HoPhilis Piques/p hysterectomy. Denies recent abnormal PAPs. She is planning [facility]  Hx of major depression in remission and is currently on Wellbutrin 30022md and doing much better.   BMI is Body mass index is 28.23 kg/m., she is working on diet and exercise, [facility], admits diet is not at good in the last several months,appetite is much more control. Doing well with intermittent phentermine, topamax caused dizziness.  She does have prediabetes, hyperlipidemia and fatty liver per [place].  Wt Readings from Last  3 Encounters:  01/23/22 187  lb (84.8 kg)  08/06/21 201 lb (91.2 kg)  01/22/21 203 lb (92.1 kg)   Her blood pressure has been running 140s/80s, Taking Telmisartan 80 mg daily their BP is BP: 126/72 She does workout. She denies chest pain, shortness of breath, dizziness.   She is on cholesterol medication, rosuvastatin 40 mg daily  and denies myalgias. Her LDL cholesterol is not at goal. The cholesterol last visit was:   Lab Results  Component Value Date   CHOL 198 08/06/2021   HDL 74 08/06/2021   LDLCALC 100 (H) 08/06/2021   TRIG 143 08/06/2021   CHOLHDL 2.7 08/06/2021   She denies Dm polys, has hx of prediabetes. Last A1C in the office was:  Lab Results  Component Value Date   HGBA1C 5.6 08/06/2021   Stable CKD IIIa. Last GFR:  Lab Results  Component Value Date   EGFR 54 (L) 08/06/2021   [name], 5000 units once daily Lab Results  Component Value Date   VD25OH 105 (H) 08/06/2021     She is on thyroid medication.  She takes 100 mcg 1 tab daily  4 days a week and 1/2 tab M, W, F Lab Results  Component Value Date   TSH 0.90 08/06/2021      Current Medications:  Current Outpatient Medications on File Prior to Visit  Medication Sig   Biotin 2500 MCG CAPS Take by mouth.   bisoprolol-hydrochlorothiazide (ZIAC) 2.5-6.25 MG tablet Take  1 tablet  Daily  for BP   buPROPion (WELLBUTRIN XL) 300 MG 24 hr tablet Take 1 tablet (300 mg total) by mouth every morning.   Cholecalciferol (VITAMIN D) 50 MCG (2000 UT) CAPS Take 2 capsules (4,000 Units total) by mouth daily. (Patient taking differently: Take 4,000 Units by mouth daily. Taking 10,000 units)   levothyroxine (SYNTHROID) 100 MCG tablet TAKE 1/2-1 TABLET BY MOUTH EVERY MORNING AS DIRECTED; Take 1/2 tab MWF, whole tab all other days. WITH ONLY GLASS OF WATER FOR 30 MINUTES   MAGNESIUM-POTASSIUM PO Take by mouth daily.   Multiple Vitamins-Minerals (MULTIVITAMIN PO) Take by mouth daily.   OVER THE COUNTER MEDICATION Tumeric daily    rosuvastatin (CRESTOR) 40 MG tablet Take  1 tablet  Daily  for Cholesterol   telmisartan (MICARDIS) 80 MG tablet Take 1/2 tablet Daily for BP   No current facility-administered medications on file prior to visit.    Immunization History  Administered Date(s) Administered   Influenza, High Dose Seasonal PF 09/29/2021   Influenza-Unspecified 09/30/2015, 09/29/2016, 10/12/2019, 09/28/2020   MMR 05/30/2014   PFIZER(Purple Top)SARS-COV-2 Vaccination 02/24/2020, 03/21/2020, 10/14/2020, 10/03/2021   Pneumococcal Conjugate-13 07/06/2018   Pneumococcal Polysaccharide-23 03/25/2006, 07/07/2019   Td 03/15/2004   Tdap 06/02/2014   Health Maintenance  Topic Date Due   DEXA SCAN  05/04/2018   COVID-19 Vaccine (5 - Booster for Pfizer series) 11/28/2021   Zoster Vaccines- Shingrix (1 of 2) 01/23/2023 (Originally 05/04/1970)   MAMMOGRAM  04/18/2023   COLONOSCOPY (Pts 45-42yr Insurance coverage will need to be confirmed)  05/03/2024   TETANUS/TDAP  06/02/2024   Pneumonia Vaccine 71 Years old  Completed   INFLUENZA VACCINE  Completed   Hepatitis C Screening  Completed   HPV VACCINES  Aged Out   Pap: 2018 -s/p total hysterectomy, Dr. HPhilis Pique DONE MGM: 02/2021 calcifications left breast , 04/17/21 spot compression benign F/U 1 year DEXA: 2007 via GYN - will plan with next mammogram   Colonoscopy: 04/2014  due 2025  Vision: Dr. Clydene Laming, last visit 2022 Dental: Dr. Posey Pronto, last visit 2022, goes q55mSkin: hx of BBasye Dr. TDenna Haggard 2019, due for skin check  Patient Care Team: MUnk Pinto MD as PCP - General (Internal Medicine) GMelissa Noon [place] Referring Physician (Optometry) TLavonna Monarch MD as Consulting Physician (Dermatology) KInda Castle MD (Inactive) as Consulting Physician (Gastroenterology) HBobbye Charleston MD as Consulting Physician (Obstetrics and Gynecology) WElsie Saas MD as Consulting Physician (Orthopedic Surgery)  Medical History:  Past Medical History:  Diagnosis  Date   Anemia    Anxiety    Basal cell carcinoma 08/17/2007   left nostril MOHS   BCC (basal cell carcinoma of skin) 04/06/2013   right nose tx cx3 518f  Cancer (HCPetrolia   basal cell CA- nose   Colon polyp, hyperplastic    2005   Depression    GERD (gastroesophageal reflux disease)    Hyperlipidemia    Prediabetes    Thyroid disease    Unspecified essential hypertension    Vitamin D deficiency    Allergies Allergies  Allergen Reactions   Topamax [Topiramate] Shortness Of Breath   Naldecon Senior [Guaifenesin]     Unsure of reaction   Prednisone     Unsure of reaction   Benazepril Hcl Other (See Comments) and Cough    Not effective for pt Not effective for pt Not effective for pt    SURGICAL HISTORY She  has a past surgical history that includes Tonsillectomy; Thyroidectomy; Vaginal hysterectomy (1994); Breast lumpectomy (Right); Colonoscopy; Mohs surgery; Knee arthroscopy (Right, 2000); Breast excisional biopsy (Left, 2002); and Cataract extraction, bilateral (Bilateral, 07/2018). FAMILY HISTORY Her family history includes Alzheimer's disease in her father; Atrial fibrillation in her sister; Diabetes type II in her sister; Hypertension in her father and mother; Valvular heart disease in her mother. SOCIAL HISTORY She  reports that she quit smoking about 16 years ago. Her smoking use included cigarettes. She has a 2.50 pack-year smoking history. She has never used smokeless tobacco. She reports current alcohol use of about 3.0 standard drinks per week. She reports that she does not use drugs.   MEDICARE WELLNESS OBJECTIVES: Physical activity: Current Exercise Habits: Home exercise routine, Type of exercise: strength training/weights;yoga;Other - see comments (cycling), Time (Minutes): 60, Frequency (Times/Week): 4, Weekly Exercise (Minutes/Week): 240, Intensity: Mild, Exercise limited by: None identified Cardiac risk factors: Cardiac Risk Factors include: advanced age (>5564m  >65>77men);dyslipidemia;hypertension Depression/mood screen:   Depression screen [facility] 01/23/2022  Decreased Interest 0  Down, Depressed, Hopeless 0  PHQ - 2 Score 0  Altered sleeping 0  Tired, decreased energy 0  Change in appetite 0  Feeling bad or failure about yourself  0  Trouble concentrating 0  Moving slowly or fidgety/restless 0  Suicidal thoughts 0  PHQ-9 Score 0  Difficult doing work/chores Not difficult at all    ADLs:  In your present state of health, do you have any difficulty performing the following activities: 01/23/2022  Hearing? N  Vision? N  Difficulty concentrating or making decisions? N  Walking or climbing stairs? N  Dressing or bathing? N  Doing errands, shopping? N  Some recent data might be hidden     Cognitive Testing  Alert? Yes  Normal Appearance?Yes  Oriented to person? Yes  Place? Yes   Time? Yes  Recall of three objects?  Yes  Can perform simple calculations? Yes  Displays appropriate judgment?Yes  Can read the correct time from a watch  face?Yes  EOL planning: Does Patient Have a Medical Advance Directive?: No Would patient like information on creating a medical advance directive?: No - Patient declined   Review of Systems  Constitutional: Negative.  Negative for chills, fever, malaise/fatigue and weight loss.  HENT: Negative.  Negative for congestion, hearing loss, sinus pain, sore throat and tinnitus.   Eyes: Negative.  Negative for blurred vision and double vision.  Respiratory: Negative.  Negative for cough, hemoptysis, sputum production, shortness of breath and wheezing.   Cardiovascular: Negative.  Negative for chest pain, palpitations, orthopnea, claudication, leg swelling and PND.  Gastrointestinal:  Positive for constipation (occ relieved with fiber) and heartburn (occ relieved with TUMS). Negative for abdominal pain, blood in stool, diarrhea, melena, nausea and vomiting.  Genitourinary: Negative.  Negative for dysuria and  urgency.  Musculoskeletal:  Negative for back pain, falls, joint pain, myalgias and neck pain.  Skin: Negative.  Negative for rash.  Neurological: Negative.  Negative for dizziness, tingling, tremors, sensory change, weakness and headaches.  Endo/Heme/Allergies: Negative.  Negative for polydipsia. Does not bruise/bleed easily.  Psychiatric/Behavioral:  Positive for depression (improved with medication). Negative for memory loss, substance abuse and suicidal ideas. The patient is not nervous/anxious and does not have insomnia.   All other systems reviewed and are negative.  Physical Exam: Estimated body mass index is 28.23 kg/m as calculated from the following:   Height as of 07/12/20: 5' 8.25" (1.734 m).   Weight as of this encounter: 187 lb (84.8 kg). BP 126/72    Pulse 69    Temp 97.9 F (36.6 C)    Wt 187 lb (84.8 kg)    SpO2 99%    BMI 28.23 kg/m    General Appearance: Well nourished, in no apparent distress. Eyes: PERRLA, EOMs, conjunctiva no swelling or erythema Sinuses: No Frontal/maxillary tenderness ENT/Mouth: Ext aud canals clear, normal light reflex with TMs without erythema, bulging.  Good dentition. No erythema, swelling, or exudate on post pharynx. Tonsils not swollen or erythematous. Hearing normal.  Neck: Supple, thyroid normal. No bruits Respiratory: Respiratory effort normal, BS equal bilaterally without rales, rhonchi, wheezing or stridor. Cardio: RRR without murmurs, rubs or gallops. Brisk peripheral pulses without edema.  Chest: symmetric, with normal excursions and percussion. Breasts:Defer to GYN Abdomen: Soft, +BS, non tender, negative murphy's, no guarding, rebound, hernias, masses, or organomegaly. .  Lymphatics: Non tender without lymphadenopathy.  Genitourinary: defer to GYN Musculoskeletal: Full ROM all peripheral extremities,5/5 strength, and normal gait. Skin: Warm, dry without rashes, concerning lesions, ecchymosis.  Neuro: Cranial nerves intact, reflexes  equal bilaterally. Normal muscle tone, no cerebellar symptoms. Sensation intact.  Psych: Awake and oriented X 3, normal affect, Insight and Judgment appropriate.     Medicare Attestation I have personally reviewed: The patient's medical and social history Their use of alcohol, tobacco or illicit drugs Their current medications and supplements The patient's functional ability including ADLs,fall risks, home safety risks, cognitive, and hearing and visual impairment Diet and physical activities Evidence for depression or mood disorders  The patient's weight, height, BMI, and visual acuity have been recorded in the chart.  I have made referrals, counseling, and provided education to the patient based on review of the above and I have provided the patient with a written personalized care plan for preventive services.     Izora Ribas, NP 3:13 PM [facility]

## 2022-01-24 LAB — COMPLETE METABOLIC PANEL WITH GFR
AG Ratio: 1.8 (calc) (ref 1.0–2.5)
ALT: 20 U/L (ref 6–29)
AST: 17 U/L (ref 10–35)
Albumin: 4.4 g/dL (ref 3.6–5.1)
Alkaline phosphatase (APISO): 69 U/L (ref 37–153)
BUN/Creatinine Ratio: 14 (calc) (ref 6–22)
BUN: 16 mg/dL (ref 7–25)
CO2: 32 mmol/L (ref 20–32)
Calcium: 10 mg/dL (ref 8.6–10.4)
Chloride: 103 mmol/L (ref 98–110)
Creat: 1.11 mg/dL — ABNORMAL HIGH (ref 0.50–1.05)
Globulin: 2.4 g/dL (calc) (ref 1.9–3.7)
Glucose, Bld: 86 mg/dL (ref 65–99)
Potassium: 3.9 mmol/L (ref 3.5–5.3)
Sodium: 142 mmol/L (ref 135–146)
Total Bilirubin: 0.3 mg/dL (ref 0.2–1.2)
Total Protein: 6.8 g/dL (ref 6.1–8.1)
eGFR: 54 mL/min/{1.73_m2} — ABNORMAL LOW (ref >=60)

## 2022-01-24 LAB — CBC WITH DIFFERENTIAL/PLATELET
Absolute Monocytes: 689 cells/uL (ref 200–950)
Basophils Absolute: 57 cells/uL (ref 0–200)
Basophils Relative: 0.8 %
Eosinophils Absolute: 71 cells/uL (ref 15–500)
Eosinophils Relative: 1 %
HCT: 41.9 % (ref 35.0–45.0)
Hemoglobin: 14 g/dL (ref 11.7–15.5)
Lymphs Abs: 1782 cells/uL (ref 850–3900)
MCH: 31 pg (ref 27.0–33.0)
MCHC: 33.4 g/dL (ref 32.0–36.0)
MCV: 92.7 fL (ref 80.0–100.0)
MPV: 9.9 fL (ref 7.5–12.5)
Monocytes Relative: 9.7 %
Neutro Abs: 4501 cells/uL (ref 1500–7800)
Neutrophils Relative %: 63.4 %
Platelets: 257 10*3/uL (ref 140–400)
RBC: 4.52 10*6/uL (ref 3.80–5.10)
RDW: 12.2 % (ref 11.0–15.0)
Total Lymphocyte: 25.1 %
WBC: 7.1 10*3/uL (ref 3.8–10.8)

## 2022-01-24 LAB — LIPID PANEL
Cholesterol: 255 mg/dL — ABNORMAL HIGH (ref <200)
HDL: 82 mg/dL (ref >=50)
LDL Cholesterol (Calc): 143 mg/dL (calc) — ABNORMAL HIGH
Non-HDL Cholesterol (Calc): 173 mg/dL (calc) — ABNORMAL HIGH (ref <130)
Total CHOL/HDL Ratio: 3.1 (calc) (ref <5.0)
Triglycerides: 167 mg/dL — ABNORMAL HIGH (ref <150)

## 2022-01-24 LAB — TSH: TSH: 1.44 mIU/L (ref 0.40–4.50)

## 2022-01-24 LAB — MAGNESIUM: Magnesium: 2.2 mg/dL (ref 1.5–2.5)

## 2022-01-24 LAB — VITAMIN D 25 HYDROXY (VIT D DEFICIENCY, FRACTURES): Vit D, 25-Hydroxy: 72 ng/mL (ref 30–100)

## 2022-01-27 ENCOUNTER — Encounter: Payer: Self-pay | Admitting: Adult Health

## 2022-01-28 ENCOUNTER — Other Ambulatory Visit: Payer: Self-pay | Admitting: Nurse Practitioner

## 2022-01-28 ENCOUNTER — Encounter: Payer: Self-pay | Admitting: Adult Health

## 2022-01-28 DIAGNOSIS — F325 Major depressive disorder, single episode, in full remission: Secondary | ICD-10-CM

## 2022-01-28 MED ORDER — BUPROPION HCL ER (XL) 300 MG PO TB24: 300.0000 mg | ORAL_TABLET | ORAL | 3 refills | Status: DC

## 2022-01-29 ENCOUNTER — Other Ambulatory Visit: Payer: Self-pay | Admitting: Adult Health

## 2022-01-29 DIAGNOSIS — E669 Obesity, unspecified: Secondary | ICD-10-CM

## 2022-01-29 MED ORDER — PHENTERMINE HCL 37.5 MG PO TABS: ORAL_TABLET | ORAL | 0 refills | Status: DC

## 2022-01-29 NOTE — Progress Notes (Signed)
Future Appointments  Date Time Provider [place]  08/06/2022  3:00 PM Magda Bernheim, NP [facility] None  03/11/2023  2:00 PM Liane Comber, NP [facility] None   PDMP reviewed for phentermine refill request.

## 2022-02-05 DIAGNOSIS — M6281 Muscle weakness (generalized): Secondary | ICD-10-CM | POA: Diagnosis not present

## 2022-02-05 DIAGNOSIS — M25511 Pain in right shoulder: Secondary | ICD-10-CM | POA: Diagnosis not present

## 2022-02-12 DIAGNOSIS — M25511 Pain in right shoulder: Secondary | ICD-10-CM | POA: Diagnosis not present

## 2022-02-12 DIAGNOSIS — M6281 Muscle weakness (generalized): Secondary | ICD-10-CM | POA: Diagnosis not present

## 2022-02-14 DIAGNOSIS — M25511 Pain in right shoulder: Secondary | ICD-10-CM | POA: Diagnosis not present

## 2022-02-14 DIAGNOSIS — M6281 Muscle weakness (generalized): Secondary | ICD-10-CM | POA: Diagnosis not present

## 2022-02-19 DIAGNOSIS — M6281 Muscle weakness (generalized): Secondary | ICD-10-CM | POA: Diagnosis not present

## 2022-02-19 DIAGNOSIS — M25511 Pain in right shoulder: Secondary | ICD-10-CM | POA: Diagnosis not present

## 2022-02-21 DIAGNOSIS — M6281 Muscle weakness (generalized): Secondary | ICD-10-CM | POA: Diagnosis not present

## 2022-02-21 DIAGNOSIS — M25511 Pain in right shoulder: Secondary | ICD-10-CM | POA: Diagnosis not present

## 2022-02-24 ENCOUNTER — Encounter: Payer: Self-pay | Admitting: Nurse Practitioner

## 2022-02-25 ENCOUNTER — Other Ambulatory Visit: Payer: Self-pay | Admitting: Nurse Practitioner

## 2022-02-25 DIAGNOSIS — I10 Essential (primary) hypertension: Secondary | ICD-10-CM

## 2022-02-25 DIAGNOSIS — E079 Disorder of thyroid, unspecified: Secondary | ICD-10-CM

## 2022-02-25 MED ORDER — BISOPROLOL-HYDROCHLOROTHIAZIDE 2.5-6.25 MG PO TABS: ORAL_TABLET | ORAL | 3 refills | Status: DC

## 2022-02-25 MED ORDER — LEVOTHYROXINE SODIUM 100 MCG PO TABS: ORAL_TABLET | ORAL | 3 refills | Status: DC

## 2022-02-26 DIAGNOSIS — M25511 Pain in right shoulder: Secondary | ICD-10-CM | POA: Diagnosis not present

## 2022-02-26 DIAGNOSIS — M6281 Muscle weakness (generalized): Secondary | ICD-10-CM | POA: Diagnosis not present

## 2022-03-03 ENCOUNTER — Encounter: Payer: Self-pay | Admitting: Nurse Practitioner

## 2022-03-04 ENCOUNTER — Other Ambulatory Visit: Payer: Self-pay | Admitting: Nurse Practitioner

## 2022-03-04 DIAGNOSIS — E785 Hyperlipidemia, unspecified: Secondary | ICD-10-CM

## 2022-03-04 MED ORDER — ROSUVASTATIN CALCIUM 40 MG PO TABS: ORAL_TABLET | ORAL | 3 refills | Status: DC

## 2022-03-04 MED ORDER — TELMISARTAN 80 MG PO TABS: ORAL_TABLET | ORAL | 3 refills | Status: DC

## 2022-03-05 DIAGNOSIS — M6281 Muscle weakness (generalized): Secondary | ICD-10-CM | POA: Diagnosis not present

## 2022-03-05 DIAGNOSIS — M25511 Pain in right shoulder: Secondary | ICD-10-CM | POA: Diagnosis not present

## 2022-03-07 DIAGNOSIS — M6281 Muscle weakness (generalized): Secondary | ICD-10-CM | POA: Diagnosis not present

## 2022-03-07 DIAGNOSIS — M25511 Pain in right shoulder: Secondary | ICD-10-CM | POA: Diagnosis not present

## 2022-03-12 DIAGNOSIS — M6281 Muscle weakness (generalized): Secondary | ICD-10-CM | POA: Diagnosis not present

## 2022-03-12 DIAGNOSIS — M25511 Pain in right shoulder: Secondary | ICD-10-CM | POA: Diagnosis not present

## 2022-03-14 DIAGNOSIS — M25511 Pain in right shoulder: Secondary | ICD-10-CM | POA: Diagnosis not present

## 2022-03-14 DIAGNOSIS — M6281 Muscle weakness (generalized): Secondary | ICD-10-CM | POA: Diagnosis not present

## 2022-03-19 DIAGNOSIS — M25511 Pain in right shoulder: Secondary | ICD-10-CM | POA: Diagnosis not present

## 2022-03-21 DIAGNOSIS — M6281 Muscle weakness (generalized): Secondary | ICD-10-CM | POA: Diagnosis not present

## 2022-03-21 DIAGNOSIS — M25511 Pain in right shoulder: Secondary | ICD-10-CM | POA: Diagnosis not present

## 2022-03-26 ENCOUNTER — Other Ambulatory Visit: Payer: Self-pay | Admitting: Adult Health Nurse Practitioner

## 2022-04-30 ENCOUNTER — Ambulatory Visit: Payer: Medicare Other | Admitting: Dermatology

## 2022-04-30 ENCOUNTER — Encounter: Payer: Self-pay | Admitting: Dermatology

## 2022-04-30 DIAGNOSIS — Z1283 Encounter for screening for malignant neoplasm of skin: Secondary | ICD-10-CM

## 2022-04-30 DIAGNOSIS — L309 Dermatitis, unspecified: Secondary | ICD-10-CM | POA: Diagnosis not present

## 2022-04-30 DIAGNOSIS — L219 Seborrheic dermatitis, unspecified: Secondary | ICD-10-CM

## 2022-04-30 DIAGNOSIS — L821 Other seborrheic keratosis: Secondary | ICD-10-CM

## 2022-04-30 DIAGNOSIS — D1800 Hemangioma unspecified site: Secondary | ICD-10-CM

## 2022-04-30 MED ORDER — CLOBETASOL PROP EMOLLIENT BASE 0.05 % EX CREA: TOPICAL_CREAM | CUTANEOUS | 2 refills | Status: AC

## 2022-05-17 ENCOUNTER — Encounter: Payer: Self-pay | Admitting: Dermatology

## 2022-05-17 NOTE — Progress Notes (Signed)
   New Patient   Subjective  Cheyenne Conway is a 71 y.o. female who presents for the following: Annual Exam (Right shoulder- rough spot/patch & dry itch ears- driving me crazy ).  General skin examination, several spots of concern Location:  Duration:  Quality:  Associated Signs/Symptoms: Modifying Factors:  Severity:  Timing: Context:    The following portions of the chart were reviewed this encounter and updated as appropriate:  Tobacco  Allergies  Meds  Problems  Med Hx  Surg Hx  Fam Hx      Objective  Well appearing patient in no apparent distress; mood and affect are within normal limits. Full body skin examination: No atypical pigmented lesions (all checked with dermoscopy), no nonmelanoma skin cancer  left shin Multiple stucco keratoses legs more than arms.  2 pink slightly inflamed 2 mm crusts on legs which could represent I SK or DSAP.  Return if there is growth or bleeding.     Multiple smooth red 1 mm dermal papules  Right Shoulder - Anterior Subacute patch of dermatitis, etiology uncertain.    A full examination was performed including scalp, head, eyes, ears, nose, lips, neck, chest, axillae, abdomen, back, buttocks, bilateral upper extremities, bilateral lower extremities, hands, feet, fingers, toes, fingernails, and toenails. All findings within normal limits unless otherwise noted below.  Undergarments not fully examined.   Assessment & Plan  Encounter for screening for malignant neoplasm of skin  Annual skin examination, encouraged to self examine twice annually.  Continued ultraviolet protection.  Seborrheic keratosis left shin  Dermatitis Right Shoulder - Anterior  Topical clobetasol daily for up to 2 weeks.  Do not use on other areas.  Contact me if there is no improvement.  Clobetasol Prop Emollient Base (CLOBETASOL PROPIONATE E) 0.05 % emollient cream - Right Shoulder - Anterior Apply to affected area qd  Hemangioma, unspecified  site  No intervention necessary

## 2022-05-30 DIAGNOSIS — Z780 Asymptomatic menopausal state: Secondary | ICD-10-CM | POA: Diagnosis not present

## 2022-05-30 LAB — HM DEXA SCAN

## 2022-06-03 ENCOUNTER — Encounter: Payer: Self-pay | Admitting: Internal Medicine

## 2022-06-09 ENCOUNTER — Encounter: Payer: Self-pay | Admitting: Internal Medicine

## 2022-07-23 ENCOUNTER — Encounter: Payer: Medicare Other | Admitting: Nurse Practitioner

## 2022-07-30 DIAGNOSIS — I10 Essential (primary) hypertension: Secondary | ICD-10-CM | POA: Diagnosis not present

## 2022-07-30 DIAGNOSIS — H26492 Other secondary cataract, left eye: Secondary | ICD-10-CM | POA: Diagnosis not present

## 2022-07-30 DIAGNOSIS — E119 Type 2 diabetes mellitus without complications: Secondary | ICD-10-CM | POA: Diagnosis not present

## 2022-08-06 ENCOUNTER — Encounter: Payer: Medicare Other | Admitting: Nurse Practitioner

## 2022-09-01 ENCOUNTER — Encounter: Payer: Self-pay | Admitting: Nurse Practitioner

## 2022-09-12 NOTE — Progress Notes (Unsigned)
Complete Physical  Assessment and Plan:  Encounter for routine physical exam without abnormal findings Mammogram due 02/2022 and wants to have DEXA at same time  Essential hypertension - Taking telmisartan 80 mg 1/2 tab QD and Ziac 2.5/6.25 QD - Monitor at home if greater than 130/80, DASH diet, exercise and monitor at home.  - CBC with Differential/Platelet - CMP/GFR - Urinalysis, Routine w reflex microscopic  - Microalbumin / creatinine urine ratio  Abnormal glucose Discussed general issues about diabetes pathophysiology and management., Educational material distributed., Suggested low cholesterol diet., Encouraged aerobic exercise., Discussed foot care., Reminded to get yearly retinal exam. - Check A1C  Hypothyroidism -check TSH level, continue medications the same, reminded to take on an empty stomach 30-4mns before food. Currently 1086m 1 tab 4 days a week and 1/2 tab M,W, F  Hyperlipidemia -continue medications, check lipids, decrease fatty foods, increase activity.  - Lipid panel  Vitamin D deficiency - Vit D  25 hydroxy (rtn osteoporosis monitoring) - Continue Supplementation , taking Vit D3 5000 units 2 tabs daily  Medication management - Magnesium  Depression, remission -Taking Wellbutrin 30053mD, mood is fairly well controlled.   Gastroesophageal reflux disease without esophagitis - continue PRN TUMS  NASH (nonalcoholic steatohepatitis) Check labs, avoid tylenol, alcohol, weight loss advised.   History of basal cell cancer Patient will schedule follow up Dr. TafDenna Haggardue for skin check  Obesity Long discussion about weight loss, diet, and exercise Discussed final goal weight and current weight loss goal (<190lb) Patient will work on cut down on snacking, increase exercise Patient has used Phentermine in the past, had been off due to depression symptoms but wants to restart Discussed need for increased fiber intake, avoid keeping bad snacks in  home continue close follow up.   Screening for ischemic heart disease - EKG  Screening for AAA - AAA    Discussed med's effects and SE's. Screening labs and tests as requested with regular follow-up as recommended. Future Appointments  Date Time Provider [place]/11/2023  2:00 PM CraDarrol JumpP [facility] None  09/22/2023  2:00 PM McKUnk PintoD [facility] None    HPI 71 6[age]. female  presents for a complete physical. She has Essential hypertension; Hyperlipidemia; Thyroid disease; Other abnormal glucose (prediabetes); GERD (gastroesophageal reflux disease); Depression, major, in remission (HCCButlerVitamin D deficiency; Medication management; Stress incontinence; NASH (nonalcoholic steatohepatitis); History of basal cell cancer; Overweight (BMI 25.0-29.9); Idiopathic hematuria; and CKD (chronic kidney disease) stage 3, GFR 30-59 ml/min (HCC) on their problem list.   Married, no children, pt is retired!  She is no longer following up with Dr. HorPhilis Pique/p hysterectomy.Denies recent abnormal PAPs.   Hx of major depression in remission and is currently on Wellbutrin 300m23m and symptoms are controlled  BMI is Body mass index is 30.43 kg/m., she is working on diet and exercise, She is exercising regularly Mon& Sat body pump, Wednesday Spin class.  She has not been following her diet . Previously used Phentermine and gained weight back since stopping- wants to restart She does have prediabetes, hyperlipidemia and fatty liver per [place].  Wt Readings from Last 3 Encounters:  09/16/22 201 lb 9.6 oz (91.4 kg)  01/23/22 187 lb (84.8 kg)  08/06/21 201 lb (91.2 kg)   Her blood pressure has been controlled at home,Taking Telmisartan 80 mg daily their BP is BP: 120/70  BP Readings from Last 3 Encounters:  09/16/22 120/70  01/23/22 126/72  08/06/21 (!) 148/72  She  does workout. She denies chest pain, shortness of breath, dizziness.  She gets some episodes of  constipation, does seem to be able to control with fiber supplement every other day.    She is on cholesterol medication, rosuvastatin 40 mg daily  and denies myalgias. She is limiting red meat and dairyHer LDL cholesterol is not at goal. The cholesterol last visit was:   Lab Results  Component Value Date   CHOL 255 (H) 01/23/2022   HDL 82 01/23/2022   LDLCALC 143 (H) 01/23/2022   TRIG 167 (H) 01/23/2022   CHOLHDL 3.1 01/23/2022   She denies Dm polys. Last A1C in the office was:  Lab Results  Component Value Date   HGBA1C 5.6 08/06/2021    Last GFR:  Lab Results  Component Value Date   EGFR 54 (L) 01/23/2022    [name], 5000 units  2 tabsdaily Lab Results  Component Value Date   VD25OH 72 01/23/2022     She is on thyroid medication.  She takes 100 mcg 1 tab daily  4 days a week and 1/2 tab M, W, F Lab Results  Component Value Date   TSH 1.44 01/23/2022      Current Medications:  Current Outpatient Medications on File Prior to Visit  Medication Sig   bisoprolol-hydrochlorothiazide (ZIAC) 2.5-6.25 MG tablet Take  1 tablet  Daily  for BP   buPROPion (WELLBUTRIN XL) 300 MG 24 hr tablet Take 1 tablet (300 mg total) by mouth every morning.   Cholecalciferol (VITAMIN D) 50 MCG (2000 UT) CAPS Take 2 capsules (4,000 Units total) by mouth daily. (Patient taking differently: Take 4,000 Units by mouth daily. Taking 10,000 units)   Clobetasol Prop Emollient Base (CLOBETASOL PROPIONATE E) 0.05 % emollient cream Apply to affected area qd   levothyroxine (SYNTHROID) 100 MCG tablet TAKE 1/2-1 TABLET BY MOUTH EVERY MORNING AS DIRECTED; Take 1/2 tab MWF, whole tab all other days. WITH ONLY GLASS OF WATER FOR 30 MINUTES   MAGNESIUM-POTASSIUM PO Take by mouth daily.   Multiple Vitamins-Minerals (MULTIVITAMIN PO) Take by mouth daily.   OVER THE COUNTER MEDICATION Tumeric daily   rosuvastatin (CRESTOR) 40 MG tablet Take  1 tablet  Daily  for Cholesterol    telmisartan (MICARDIS) 80 MG tablet TAKE 1/2 TABLET BY MOUTH DAILY FOR BLOOD PRESSURE   Biotin 2500 MCG CAPS Take by mouth. (Patient not taking: Reported on 09/16/2022)   phentermine (ADIPEX-P) 37.5 MG tablet Take 1/2 to 1 tablet every morning for dieting & weight loss (Patient not taking: Reported on 09/16/2022)   No current facility-administered medications on file prior to visit.    Tetanus: 2015 Pneumovax: 2007, 2020 Prevnar 13: 2019 Flu vaccine: 09/28/20 Zostavax: has not yet because not covered by insurance Covid 19: [place] 02/24/20, 03/21/20, 10/14/20  Pap: 2018 -s/p total hysterectomy, Dr. Philis Pique. DONE MGM: 05/2022 dense breast tissue DEXA: 05/30/22- Normal  Colonoscopy: 04/2014 due 2025   Vision: Dr. Clydene Laming, last visit 2023, follow up this week Dental: Dr. Posey Pronto, last visit 2023, goes q23mSkin: hx of BCC, Dr. TDenna Haggard 2019 , due for skin check  Patient Care Team: MUnk Pinto MD as PCP - General (Internal Medicine) GMelissa Noon OElyriaas Referring Physician (Optometry) TLavonna Monarch MD (Inactive) as Consulting Physician (Dermatology) KInda Castle MD (Inactive) as Consulting Physician (Gastroenterology) HBobbye Charleston MD as Consulting Physician (Obstetrics and Gynecology) WElsie Saas MD as Consulting Physician (Orthopedic Surgery)  Medical History:  Past Medical History:  Diagnosis  Date   Anemia    Anxiety    Basal cell carcinoma 08/17/2007   left nostril MOHS   BCC (basal cell carcinoma of skin) 04/06/2013   right nose tx cx3 30f   Cancer (HCC)    basal cell CA- nose   Colon polyp, hyperplastic    2005   Depression    GERD (gastroesophageal reflux disease)    Hyperlipidemia    Prediabetes    Thyroid disease    Unspecified essential hypertension    Vitamin D deficiency    Allergies Allergies  Allergen Reactions   Topamax [Topiramate] Shortness Of Breath   Naldecon Senior [Guaifenesin]     Unsure of reaction   Prednisone     Unsure of  reaction   Benazepril Hcl Other (See Comments) and Cough    Not effective for pt Not effective for pt Not effective for pt    SURGICAL HISTORY She  has a past surgical history that includes Tonsillectomy; Thyroidectomy; Vaginal hysterectomy (1994); Breast lumpectomy (Right); Colonoscopy; Mohs surgery; Knee arthroscopy (Right, 2000); Breast excisional biopsy (Left, 2002); and Cataract extraction, bilateral (Bilateral, 07/2018). FAMILY HISTORY Her family history includes Alzheimer's disease in her father; Atrial fibrillation in her sister; Diabetes type II in her sister; Hypertension in her father and mother; Valvular heart disease in her mother. SOCIAL HISTORY She  reports that she quit smoking about 17 years ago. Her smoking use included cigarettes. She has a 2.50 pack-year smoking history. She has never used smokeless tobacco. She reports current alcohol use of about 3.0 standard drinks of alcohol per week. She reports that she does not use drugs.   Review of Systems  Constitutional: Negative.  Negative for chills, fever, malaise/fatigue and weight loss.  HENT: Negative.  Negative for congestion, hearing loss, sinus pain, sore throat and tinnitus.   Eyes: Negative.  Negative for blurred vision and double vision.  Respiratory: Negative.  Negative for cough, hemoptysis, sputum production, shortness of breath and wheezing.   Cardiovascular: Negative.  Negative for chest pain, palpitations, orthopnea, claudication, leg swelling and PND.  Gastrointestinal:  Positive for constipation (occ relieved with fiber) and heartburn (occ relieved with TUMS). Negative for abdominal pain, blood in stool, diarrhea, melena, nausea and vomiting.  Genitourinary: Negative.  Negative for dysuria and urgency.  Musculoskeletal:  Negative for back pain, falls, joint pain, myalgias and neck pain.  Skin: Negative.  Negative for rash.  Neurological: Negative.  Negative for dizziness, tingling, tremors, sensory change,  weakness and headaches.  Endo/Heme/Allergies: Negative.  Negative for polydipsia. Does not bruise/bleed easily.  Psychiatric/Behavioral:  Positive for depression (improved with medication). Negative for memory loss, substance abuse and suicidal ideas. The patient is not nervous/anxious and does not have insomnia.   All other systems reviewed and are negative.   Physical Exam: Estimated body mass index is 30.43 kg/m as calculated from the following:   Height as of this encounter: 5' 8.25" (1.734 m).   Weight as of this encounter: 201 lb 9.6 oz (91.4 kg). BP 120/70   Pulse 80   Temp (!) 97.5 F (36.4 C)   Ht 5' 8.25" (1.734 m)   Wt 201 lb 9.6 oz (91.4 kg)   SpO2 97%   BMI 30.43 kg/m    General Appearance: Well nourished, in no apparent distress. Eyes: PERRLA, EOMs, conjunctiva no swelling or erythema Sinuses: No Frontal/maxillary tenderness ENT/Mouth: Ext aud canals clear, normal light reflex with TMs without erythema, bulging.  Good dentition. No erythema, swelling, or exudate on  post pharynx. Tonsils not swollen or erythematous. Hearing normal.  Neck: Supple, thyroid normal. No bruits Respiratory: Respiratory effort normal, BS equal bilaterally without rales, rhonchi, wheezing or stridor. Cardio: RRR without murmurs, rubs or gallops. Brisk peripheral pulses without edema.  Chest: symmetric, with normal excursions and percussion. Breasts:Defer to GYN Abdomen: Soft, +BS, non tender, negative murphy's, no guarding, rebound, hernias, masses, or organomegaly. .  Lymphatics: Non tender without lymphadenopathy.  Genitourinary: defer to GYN Musculoskeletal: Full ROM all peripheral extremities,5/5 strength, and normal gait. Skin: Warm, dry without rashes, concerning lesions, ecchymosis.  Neuro: Cranial nerves intact, reflexes equal bilaterally. Normal muscle tone, no cerebellar symptoms. Sensation intact.  Psych: Awake and oriented X 3, normal affect, Insight and Judgment appropriate.   EKG: NSR, No ST changes  AAA: < 3 cm   Cheyenne Worthing Mikki Santee, NP 2:02 PM [facility]

## 2022-09-16 ENCOUNTER — Ambulatory Visit (INDEPENDENT_AMBULATORY_CARE_PROVIDER_SITE_OTHER): Payer: Medicare Other | Admitting: Nurse Practitioner

## 2022-09-16 ENCOUNTER — Encounter: Payer: Self-pay | Admitting: Nurse Practitioner

## 2022-09-16 VITALS — BP 120/70 | HR 80 | Temp 97.5°F | Ht 68.25 in | Wt 201.6 lb

## 2022-09-16 DIAGNOSIS — E785 Hyperlipidemia, unspecified: Secondary | ICD-10-CM

## 2022-09-16 DIAGNOSIS — Z79899 Other long term (current) drug therapy: Secondary | ICD-10-CM

## 2022-09-16 DIAGNOSIS — I10 Essential (primary) hypertension: Secondary | ICD-10-CM | POA: Diagnosis not present

## 2022-09-16 DIAGNOSIS — E669 Obesity, unspecified: Secondary | ICD-10-CM

## 2022-09-16 DIAGNOSIS — Z0000 Encounter for general adult medical examination without abnormal findings: Secondary | ICD-10-CM

## 2022-09-16 DIAGNOSIS — Z136 Encounter for screening for cardiovascular disorders: Secondary | ICD-10-CM

## 2022-09-16 DIAGNOSIS — N183 Chronic kidney disease, stage 3 unspecified: Secondary | ICD-10-CM

## 2022-09-16 DIAGNOSIS — F325 Major depressive disorder, single episode, in full remission: Secondary | ICD-10-CM

## 2022-09-16 DIAGNOSIS — E039 Hypothyroidism, unspecified: Secondary | ICD-10-CM

## 2022-09-16 DIAGNOSIS — R7309 Other abnormal glucose: Secondary | ICD-10-CM

## 2022-09-16 DIAGNOSIS — K7581 Nonalcoholic steatohepatitis (NASH): Secondary | ICD-10-CM

## 2022-09-16 DIAGNOSIS — I700 Atherosclerosis of aorta: Secondary | ICD-10-CM | POA: Diagnosis not present

## 2022-09-16 DIAGNOSIS — Z0001 Encounter for general adult medical examination with abnormal findings: Secondary | ICD-10-CM

## 2022-09-16 DIAGNOSIS — E559 Vitamin D deficiency, unspecified: Secondary | ICD-10-CM

## 2022-09-16 DIAGNOSIS — Z85828 Personal history of other malignant neoplasm of skin: Secondary | ICD-10-CM

## 2022-09-16 MED ORDER — PHENTERMINE HCL 37.5 MG PO TABS: ORAL_TABLET | ORAL | 1 refills | Status: DC

## 2022-09-17 ENCOUNTER — Other Ambulatory Visit: Payer: Self-pay | Admitting: Nurse Practitioner

## 2022-09-17 DIAGNOSIS — E785 Hyperlipidemia, unspecified: Secondary | ICD-10-CM

## 2022-09-17 LAB — TSH: TSH: 0.81 mIU/L (ref 0.40–4.50)

## 2022-09-17 LAB — COMPLETE METABOLIC PANEL WITH GFR
AG Ratio: 1.8 (calc) (ref 1.0–2.5)
ALT: 20 U/L (ref 6–29)
AST: 17 U/L (ref 10–35)
Albumin: 4.4 g/dL (ref 3.6–5.1)
Alkaline phosphatase (APISO): 63 U/L (ref 37–153)
BUN/Creatinine Ratio: 16 (calc) (ref 6–22)
BUN: 20 mg/dL (ref 7–25)
CO2: 29 mmol/L (ref 20–32)
Calcium: 9.7 mg/dL (ref 8.6–10.4)
Chloride: 105 mmol/L (ref 98–110)
Creat: 1.24 mg/dL — ABNORMAL HIGH (ref 0.50–1.05)
Globulin: 2.5 g/dL (calc) (ref 1.9–3.7)
Glucose, Bld: 100 mg/dL — ABNORMAL HIGH (ref 65–99)
Potassium: 4.1 mmol/L (ref 3.5–5.3)
Sodium: 143 mmol/L (ref 135–146)
Total Bilirubin: 0.3 mg/dL (ref 0.2–1.2)
Total Protein: 6.9 g/dL (ref 6.1–8.1)
eGFR: 47 mL/min/{1.73_m2} — ABNORMAL LOW (ref >=60)

## 2022-09-17 LAB — CBC WITH DIFFERENTIAL/PLATELET
Absolute Monocytes: 772 cells/uL (ref 200–950)
Basophils Absolute: 50 cells/uL (ref 0–200)
Basophils Relative: 0.6 %
Eosinophils Absolute: 66 cells/uL (ref 15–500)
Eosinophils Relative: 0.8 %
HCT: 40.8 % (ref 35.0–45.0)
Hemoglobin: 13.9 g/dL (ref 11.7–15.5)
Lymphs Abs: 1967 cells/uL (ref 850–3900)
MCH: 31.5 pg (ref 27.0–33.0)
MCHC: 34.1 g/dL (ref 32.0–36.0)
MCV: 92.5 fL (ref 80.0–100.0)
MPV: 9.5 fL (ref 7.5–12.5)
Monocytes Relative: 9.3 %
Neutro Abs: 5445 cells/uL (ref 1500–7800)
Neutrophils Relative %: 65.6 %
Platelets: 245 10*3/uL (ref 140–400)
RBC: 4.41 10*6/uL (ref 3.80–5.10)
RDW: 12 % (ref 11.0–15.0)
Total Lymphocyte: 23.7 %
WBC: 8.3 10*3/uL (ref 3.8–10.8)

## 2022-09-17 LAB — LIPID PANEL
Cholesterol: 225 mg/dL — ABNORMAL HIGH (ref <200)
HDL: 92 mg/dL (ref >=50)
LDL Cholesterol (Calc): 106 mg/dL (calc) — ABNORMAL HIGH
Non-HDL Cholesterol (Calc): 133 mg/dL (calc) — ABNORMAL HIGH (ref <130)
Total CHOL/HDL Ratio: 2.4 (calc) (ref <5.0)
Triglycerides: 156 mg/dL — ABNORMAL HIGH (ref <150)

## 2022-09-17 LAB — MAGNESIUM: Magnesium: 2.2 mg/dL (ref 1.5–2.5)

## 2022-09-17 LAB — MICROALBUMIN / CREATININE URINE RATIO
Creatinine, Urine: 54 mg/dL (ref 20–275)
Microalb, Ur: <0.2 mg/dL

## 2022-09-17 LAB — VITAMIN D 25 HYDROXY (VIT D DEFICIENCY, FRACTURES): Vit D, 25-Hydroxy: 60 ng/mL (ref 30–100)

## 2022-09-17 LAB — URINALYSIS, ROUTINE W REFLEX MICROSCOPIC
Bacteria, UA: NONE SEEN /HPF
Bilirubin Urine: NEGATIVE
Glucose, UA: NEGATIVE
Hyaline Cast: NONE SEEN /LPF
Ketones, ur: NEGATIVE
Leukocytes,Ua: NEGATIVE
Nitrite: NEGATIVE
Protein, ur: NEGATIVE
RBC / HPF: NONE SEEN /HPF (ref 0–2)
Specific Gravity, Urine: 1.01 (ref 1.001–1.035)
Squamous Epithelial / HPF: NONE SEEN /HPF (ref <=5)
WBC, UA: NONE SEEN /HPF (ref 0–5)
pH: 6.5 (ref 5.0–8.0)

## 2022-09-17 LAB — MICROSCOPIC MESSAGE

## 2022-09-17 LAB — HEMOGLOBIN A1C
Hgb A1c MFr Bld: 5.7 % of total Hgb — ABNORMAL HIGH (ref <5.7)
Mean Plasma Glucose: 117 mg/dL
eAG (mmol/L): 6.5 mmol/L

## 2022-09-17 MED ORDER — EZETIMIBE 10 MG PO TABS: 10.0000 mg | ORAL_TABLET | Freq: Every day | ORAL | 11 refills | Status: DC

## 2022-09-18 DIAGNOSIS — H26492 Other secondary cataract, left eye: Secondary | ICD-10-CM | POA: Diagnosis not present

## 2022-09-18 DIAGNOSIS — I10 Essential (primary) hypertension: Secondary | ICD-10-CM | POA: Diagnosis not present

## 2022-09-18 DIAGNOSIS — H18413 Arcus senilis, bilateral: Secondary | ICD-10-CM | POA: Diagnosis not present

## 2022-09-26 DIAGNOSIS — H26492 Other secondary cataract, left eye: Secondary | ICD-10-CM | POA: Diagnosis not present

## 2022-10-20 ENCOUNTER — Other Ambulatory Visit: Payer: Self-pay | Admitting: Nurse Practitioner

## 2022-10-20 DIAGNOSIS — I10 Essential (primary) hypertension: Secondary | ICD-10-CM

## 2022-10-20 DIAGNOSIS — E785 Hyperlipidemia, unspecified: Secondary | ICD-10-CM

## 2022-11-12 ENCOUNTER — Other Ambulatory Visit: Payer: Self-pay | Admitting: Nurse Practitioner

## 2022-11-12 DIAGNOSIS — F325 Major depressive disorder, single episode, in full remission: Secondary | ICD-10-CM

## 2023-01-23 DIAGNOSIS — S46212A Strain of muscle, fascia and tendon of other parts of biceps, left arm, initial encounter: Secondary | ICD-10-CM | POA: Diagnosis not present

## 2023-01-24 ENCOUNTER — Other Ambulatory Visit: Payer: Self-pay | Admitting: Nurse Practitioner

## 2023-01-24 DIAGNOSIS — E079 Disorder of thyroid, unspecified: Secondary | ICD-10-CM

## 2023-01-28 DIAGNOSIS — M6281 Muscle weakness (generalized): Secondary | ICD-10-CM | POA: Diagnosis not present

## 2023-01-28 DIAGNOSIS — M25512 Pain in left shoulder: Secondary | ICD-10-CM | POA: Diagnosis not present

## 2023-01-30 DIAGNOSIS — M25512 Pain in left shoulder: Secondary | ICD-10-CM | POA: Diagnosis not present

## 2023-01-30 DIAGNOSIS — M6281 Muscle weakness (generalized): Secondary | ICD-10-CM | POA: Diagnosis not present

## 2023-02-04 DIAGNOSIS — M25512 Pain in left shoulder: Secondary | ICD-10-CM | POA: Diagnosis not present

## 2023-02-04 DIAGNOSIS — M6281 Muscle weakness (generalized): Secondary | ICD-10-CM | POA: Diagnosis not present

## 2023-02-06 DIAGNOSIS — M25512 Pain in left shoulder: Secondary | ICD-10-CM | POA: Diagnosis not present

## 2023-02-06 DIAGNOSIS — M6281 Muscle weakness (generalized): Secondary | ICD-10-CM | POA: Diagnosis not present

## 2023-02-11 DIAGNOSIS — M25512 Pain in left shoulder: Secondary | ICD-10-CM | POA: Diagnosis not present

## 2023-02-11 DIAGNOSIS — M6281 Muscle weakness (generalized): Secondary | ICD-10-CM | POA: Diagnosis not present

## 2023-02-13 DIAGNOSIS — M6281 Muscle weakness (generalized): Secondary | ICD-10-CM | POA: Diagnosis not present

## 2023-02-13 DIAGNOSIS — M25512 Pain in left shoulder: Secondary | ICD-10-CM | POA: Diagnosis not present

## 2023-02-18 DIAGNOSIS — M25512 Pain in left shoulder: Secondary | ICD-10-CM | POA: Diagnosis not present

## 2023-02-18 DIAGNOSIS — M6281 Muscle weakness (generalized): Secondary | ICD-10-CM | POA: Diagnosis not present

## 2023-02-20 DIAGNOSIS — M6281 Muscle weakness (generalized): Secondary | ICD-10-CM | POA: Diagnosis not present

## 2023-02-20 DIAGNOSIS — M25512 Pain in left shoulder: Secondary | ICD-10-CM | POA: Diagnosis not present

## 2023-02-25 DIAGNOSIS — M25512 Pain in left shoulder: Secondary | ICD-10-CM | POA: Diagnosis not present

## 2023-02-25 DIAGNOSIS — M6281 Muscle weakness (generalized): Secondary | ICD-10-CM | POA: Diagnosis not present

## 2023-02-27 DIAGNOSIS — M6281 Muscle weakness (generalized): Secondary | ICD-10-CM | POA: Diagnosis not present

## 2023-02-27 DIAGNOSIS — M25512 Pain in left shoulder: Secondary | ICD-10-CM | POA: Diagnosis not present

## 2023-03-03 DIAGNOSIS — M6281 Muscle weakness (generalized): Secondary | ICD-10-CM | POA: Diagnosis not present

## 2023-03-03 DIAGNOSIS — M25512 Pain in left shoulder: Secondary | ICD-10-CM | POA: Diagnosis not present

## 2023-03-11 ENCOUNTER — Encounter: Payer: Self-pay | Admitting: Nurse Practitioner

## 2023-03-11 ENCOUNTER — Ambulatory Visit (INDEPENDENT_AMBULATORY_CARE_PROVIDER_SITE_OTHER): Payer: Medicare Other | Admitting: Nurse Practitioner

## 2023-03-11 VITALS — BP 128/76 | HR 69 | Temp 97.7°F | Ht 68.25 in | Wt 198.4 lb

## 2023-03-11 DIAGNOSIS — R7309 Other abnormal glucose: Secondary | ICD-10-CM

## 2023-03-11 DIAGNOSIS — K219 Gastro-esophageal reflux disease without esophagitis: Secondary | ICD-10-CM

## 2023-03-11 DIAGNOSIS — E785 Hyperlipidemia, unspecified: Secondary | ICD-10-CM | POA: Diagnosis not present

## 2023-03-11 DIAGNOSIS — Z0001 Encounter for general adult medical examination with abnormal findings: Secondary | ICD-10-CM

## 2023-03-11 DIAGNOSIS — K7581 Nonalcoholic steatohepatitis (NASH): Secondary | ICD-10-CM

## 2023-03-11 DIAGNOSIS — I10 Essential (primary) hypertension: Secondary | ICD-10-CM | POA: Diagnosis not present

## 2023-03-11 DIAGNOSIS — E669 Obesity, unspecified: Secondary | ICD-10-CM

## 2023-03-11 DIAGNOSIS — N183 Chronic kidney disease, stage 3 unspecified: Secondary | ICD-10-CM | POA: Diagnosis not present

## 2023-03-11 DIAGNOSIS — E039 Hypothyroidism, unspecified: Secondary | ICD-10-CM

## 2023-03-11 DIAGNOSIS — F325 Major depressive disorder, single episode, in full remission: Secondary | ICD-10-CM

## 2023-03-11 DIAGNOSIS — E559 Vitamin D deficiency, unspecified: Secondary | ICD-10-CM | POA: Diagnosis not present

## 2023-03-11 DIAGNOSIS — N029 Recurrent and persistent hematuria with unspecified morphologic changes: Secondary | ICD-10-CM | POA: Diagnosis not present

## 2023-03-11 DIAGNOSIS — R6889 Other general symptoms and signs: Secondary | ICD-10-CM | POA: Diagnosis not present

## 2023-03-11 DIAGNOSIS — Z85828 Personal history of other malignant neoplasm of skin: Secondary | ICD-10-CM

## 2023-03-11 DIAGNOSIS — Z79899 Other long term (current) drug therapy: Secondary | ICD-10-CM

## 2023-03-11 DIAGNOSIS — Z0000 Encounter for general adult medical examination without abnormal findings: Secondary | ICD-10-CM

## 2023-03-11 NOTE — Progress Notes (Signed)
AWV and follow up  Assessment and Plan:  Annual Medicare Wellness Visit Due annually  Health maintenance reviewed  Essential hypertension Continue medications. Discussed DASH (Dietary Approaches to Stop Hypertension) DASH diet is lower in sodium than a typical American diet. Cut back on foods that are high in saturated fat, cholesterol, and trans fats. Eat more whole-grain foods, fish, poultry, and nuts Remain active and exercise as tolerated daily.  Monitor BP at home-Call if greater than 130/80.  Check CMP/CBC  Abnormal glucose (prediabetes) Education: Reviewed 'ABCs' of diabetes management  Discussed goals to be met and/or maintained include A1C (<7) Blood pressure (<130/80) Cholesterol (LDL <70) Continue Eye Exam yearly  Continue Dental Exam Q6 mo Discussed dietary recommendations Discussed Physical Activity recommendations Foot exam UTD Check A1C  Hypothyroidism Controlled. Continue Levothyroxine. Reminded to take on an empty stomach 30-72mns before food.  Stop any Biotin Supplement 48-72 hours before next TSH level to reduce the risk of falsely low TSH levels. Continue to monitor.    Hyperlipidemia Discussed lifestyle modifications. Recommended diet heavy in fruits and veggies, omega 3's. Decrease consumption of animal meats, cheeses, and dairy products. Remain active and exercise as tolerated. Continue to monitor. Check lipids/TSH  Vitamin D deficiency Continue Supplementation for goal 60-100 Monitor levels  Medication management All medications discussed and reviewed in full. All questions and concerns regarding medications addressed.    Depression, remission Controlled Continue Wellbutrin '300mg'$  QD  Gastroesophageal reflux disease without esophagitis Continue antacid PRN No suspected reflux complications (Bar[place]/stricture). Lifestyle modification:  wt loss, avoid meals 2-3h before bedtime. Consider eliminating food triggers:  chocolate, caffeine,  EtOH, acid/spicy food.  NASH (nonalcoholic steatohepatitis) Check labs, avoid tylenol, alcohol, weight loss advised.   History of basal cell cancer Dermatology Dr. TDenna Haggardfollowing  Overweight - BMI 28 Discussed appropriate BMI Diet modification. Physical activity. Encouraged/praised to build confidence.  CKD3 Discussed how what you eat and drink can aide in kidney protection. Stay well hydrated. Avoid high salt foods. Avoid NSAIDS. Keep BP and BG well controlled.   Take medications as prescribed. Remain active and exercise as tolerated daily. Maintain weight.  Continue to monitor. Check CMP/GFR/Microablumin  Idiopathic Hematuria/Stress incontinence Monitor UA/CBC [place]egal exercises   Orders Placed This Encounter  Procedures   CBC with Differential/Platelet   COMPLETE METABOLIC PANEL WITH GFR   Lipid panel   Hemoglobin A1c   TSH    Notify office for further evaluation and treatment, questions or concerns if any reported s/s fail to improve.   The patient was advised to call back or seek an in-person evaluation if any symptoms worsen or if the condition fails to improve as anticipated.   Further disposition pending results of labs. Discussed med's effects and SE's.    I discussed the assessment and treatment plan with the patient. The patient was provided an opportunity to ask questions and all were answered. The patient agreed with the plan and demonstrated an understanding of the instructions.  Discussed med's effects and SE's. Screening labs and tests as requested with regular follow-up as recommended.  I provided 35 minutes of face-to-face time during this encounter including counseling, chart review, and critical decision making was preformed.  Today's Plan of Care is based on a patient-centered health care approach known as shared decision making - the decisions, tests and treatments allow for patient preferences and values to be balanced with clinical evidence.        Future Appointments  Date Time Provider [place] 09/22/2023  2:00 PM  Unk Pinto, MD [facility] None  03/22/2024  4:00 PM Darrol Jump, NP [facility] None     Plan:   During the course of the visit the patient was educated and counseled about appropriate screening and preventive services including:   Pneumococcal vaccine  Prevnar 13 Influenza vaccine Td vaccine Screening electrocardiogram Bone densitometry screening Colorectal cancer screening Diabetes screening Glaucoma screening Nutrition counseling  Advanced directives: requested   HPI 72 y.o. female  presents for an annual wellness. She has Essential hypertension; Hyperlipidemia; Thyroid disease; Other abnormal glucose (prediabetes); GERD (gastroesophageal reflux disease); Depression, major, in remission ([place]); Vitamin D deficiency; Medication management; Stress incontinence; NASH (nonalcoholic steatohepatitis); History of basal cell cancer; Overweight (BMI 25.0-29.9); Idiopathic hematuria; and CKD (chronic kidney disease) stage 3, GFR 30-59 ml/min (HCC) on their problem list.   Married, no children, retired.   Overall she reports feeling well today.  She has no new or additional concerns at this time.   She is no longer following up with Dr. Philis Pique; s/p hysterectomy. Denies recent abnormal PAPs.   Hx of major depression in remission and is currently on Wellbutrin '300mg'$  qd and doing much better.   BMI is Body mass index is 29.95 kg/m., she is working on diet and exercise, continuing Phentermine. Wt Readings from Last 3 Encounters:  03/11/23 198 lb 6.4 oz (90 kg)  09/16/22 201 lb 9.6 oz (91.4 kg)  01/23/22 187 lb (84.8 kg)   Her blood pressure has been running 140s/80s, Taking Telmisartan 80 mg daily their BP is BP: 128/76 She does workout. She denies chest pain, shortness of breath, dizziness.   She is on cholesterol medication, rosuvastatin 40 mg daily  and denies myalgias. Her LDL  cholesterol is not at goal. The cholesterol last visit was:   Lab Results  Component Value Date   CHOL 225 (H) 09/16/2022   HDL 92 09/16/2022   LDLCALC 106 (H) 09/16/2022   TRIG 156 (H) 09/16/2022   CHOLHDL 2.4 09/16/2022   She denies Dm polys, has hx of prediabetes. Last A1C in the office was:  Lab Results  Component Value Date   HGBA1C 5.7 (H) 09/16/2022   Stable CKD IIIa. Last GFR:  Lab Results  Component Value Date   EGFR 47 (L) 09/16/2022   EGFR 54 (L) 01/23/2022   EGFR 54 (L) 08/06/2021   [name], 5000 units once daily Lab Results  Component Value Date   VD25OH 60 09/16/2022     She is on thyroid medication.  She takes 100 mcg 1 tab daily  4 days a week and 1/2 tab M, W, F Lab Results  Component Value Date   TSH 0.81 09/16/2022    Current Medications:  Current Outpatient Medications on File Prior to Visit  Medication Sig   bisoprolol-hydrochlorothiazide (ZIAC) 2.5-6.25 MG tablet TAKE 1 TABLET BY MOUTH DAILY FOR BLOOD PRESSURE   buPROPion (WELLBUTRIN XL) 300 MG 24 hr tablet TAKE 1 TABLET BY MOUTH EVERY  MORNING   Cholecalciferol (VITAMIN D) 50 MCG (2000 UT) CAPS Take 2 capsules (4,000 Units total) by mouth daily. (Patient taking differently: Take 4,000 Units by mouth daily. Taking 10,000 units)   Clobetasol Prop Emollient Base (CLOBETASOL PROPIONATE E) 0.05 % emollient cream Apply to affected area qd   ezetimibe (ZETIA) 10 MG tablet Take 1 tablet (10 mg total) by mouth daily.   levothyroxine (SYNTHROID) 100 MCG tablet TAKE EVERY MORNING AS DIRECTED;  TAKE 1/2 TAB BY MOUTH MONDAY,  WEDNESDAY, AND  FRIDAY , 1 TAB  ALL OTHER DAYS. WITH ONLY GLASS  OF WATER FOR 30 MINUTES   MAGNESIUM-POTASSIUM PO Take by mouth daily.   Multiple Vitamins-Minerals (MULTIVITAMIN PO) Take by mouth daily.   OVER THE COUNTER MEDICATION Tumeric daily   rosuvastatin (CRESTOR) 40 MG tablet TAKE 1 TABLET BY MOUTH DAILY FOR CHOLESTEROL   telmisartan (MICARDIS) 80 MG tablet  TAKE 1/2 TABLET BY MOUTH DAILY FOR BLOOD PRESSURE   phentermine (ADIPEX-P) 37.5 MG tablet Take 1/2 to 1 tablet every morning for dieting & weight loss (Patient not taking: Reported on 03/11/2023)   No current facility-administered medications on file prior to visit.    Immunization History  Administered Date(s) Administered   Influenza, High Dose Seasonal PF 09/29/2021   Influenza-Unspecified 09/30/2015, 09/29/2016, 10/12/2019, 09/28/2020   MMR 05/30/2014   PFIZER(Purple Top)SARS-COV-2 Vaccination 02/24/2020, 03/21/2020, 10/14/2020, 10/03/2021   Pneumococcal Conjugate-13 07/06/2018   Pneumococcal Polysaccharide-23 03/25/2006, 07/07/2019   Td 03/15/2004   Tdap 06/02/2014   Health Maintenance  Topic Date Due   Zoster Vaccines- Shingrix (1 of 2) Never done   COVID-19 Vaccine (5 - 2023-24 season) 08/30/2022   MAMMOGRAM  04/18/2023   Medicare Annual Wellness (AWV)  03/10/2024   COLONOSCOPY (Pts 45-67yr Insurance coverage will need to be confirmed)  05/03/2024   DTaP/Tdap/Td (3 - Td or Tdap) 06/02/2024   Pneumonia Vaccine 70 Years old  Completed   INFLUENZA VACCINE  Completed   DEXA SCAN  Completed   Hepatitis C Screening  Completed   HPV VACCINES  Aged Out   Pap: 2018 -s/p total hysterectomy, Dr. HPhilis Pique DONE MGM: 02/2021 calcifications left breast , 04/17/22 spot compression benign F/U 1 year DEXA: 2007 via GYN - will plan with next mammogram   Colonoscopy: 04/2014 due 2025  Vision: Dr. WClydene Laming last visit 2023 Dental: Dr. PPosey Pronto last visit 2023, goes [id number]: hx of BCC, Dr. TaDenna Haggard2023, due for skin check  Patient Care Team: McUnk PintoMD as PCP - General (Internal Medicine) GoMelissa NoonODEast Daileys Referring Physician (Optometry) TaLavonna MonarchMD (Inactive) as Consulting Physician (Dermatology) KaInda CastleMD (Inactive) as Consulting Physician (Gastroenterology) HoBobbye CharlestonMD as Consulting Physician (Obstetrics and Gynecology) WaElsie SaasMD as  Consulting Physician (Orthopedic Surgery)  Medical History:  Past Medical History:  Diagnosis Date   Anemia    Anxiety    Basal cell carcinoma 08/17/2007   left nostril MOHS   BCC (basal cell carcinoma of skin) 04/06/2013   right nose tx [id number] [place]  basal [place]- nose   Colon polyp, hyperplastic    2005   Depression    GERD (gastroesophageal reflux disease)    Hyperlipidemia    Prediabetes    Thyroid disease    Unspecified essential hypertension    Vitamin D deficiency    Allergies Allergies  Allergen Reactions   Topamax [Topiramate] Shortness Of Breath   Naldecon Senior [Guaifenesin]     Unsure of reaction   Prednisone     Unsure of reaction   Benazepril Hcl Other (See Comments) and Cough    Not effective for pt Not effective for pt Not effective for pt    SURGICAL HISTORY She  has a past surgical history that includes Tonsillectomy; Thyroidectomy; Vaginal hysterectomy (1994); Breast lumpectomy (Right); Colonoscopy; Mohs surgery; Knee arthroscopy (Right, 2000); Breast excisional biopsy (Left, 2002); and Cataract extraction, bilateral (Bilateral, 07/2018). FAMILY HISTORY Her family history includes Alzheimer's disease in her father; Atrial fibrillation in  her sister; Diabetes type II in her sister; Hypertension in her father and mother; Valvular heart disease in her mother. SOCIAL HISTORY She  reports that she quit smoking about 18 years ago. Her smoking use included cigarettes. She has a 2.50 pack-year smoking history. She has never used smokeless tobacco. She reports current alcohol use of about 3.0 standard drinks of alcohol per week. She reports that she does not use drugs.   MEDICARE WELLNESS OBJECTIVES: Physical activity: Current Exercise Habits: Home exercise routine Cardiac risk factors:   Depression/mood screen:      03/11/2023    2:24 PM  Depression screen PHQ 2/9  Decreased Interest 0  Down, Depressed, Hopeless 0  PHQ - 2 Score 0     ADLs:     03/11/2023    2:23 PM  In your present state of health, do you have any difficulty performing the following activities:  Hearing? 0  Vision? 0  Difficulty concentrating or making decisions? 0  Walking or climbing stairs? 0  Dressing or bathing? 0  Doing errands, shopping? 0  Preparing Food and eating ? N  Using the Toilet? N  In the past six months, have you accidently leaked urine? N  Do you have problems with loss of bowel control? N  Managing your Medications? N  Managing your Finances? N  Housekeeping or managing your Housekeeping? N     Cognitive Testing  Alert? Yes  Normal Appearance?Yes  Oriented to person? Yes  Place? Yes   Time? Yes  Recall of three objects?  Yes  Can perform simple calculations? Yes  Displays appropriate judgment?Yes  Can read the correct time from a watch face?Yes  EOL planning: Does Patient Have a Medical Advance Directive?: No   Review of Systems  Constitutional: Negative.  Negative for chills, fever, malaise/fatigue and weight loss.  HENT: Negative.  Negative for congestion, hearing loss, sinus pain, sore throat and tinnitus.   Eyes: Negative.  Negative for blurred vision and double vision.  Respiratory: Negative.  Negative for cough, hemoptysis, sputum production, shortness of breath and wheezing.   Cardiovascular: Negative.  Negative for chest pain, palpitations, orthopnea, claudication, leg swelling and PND.  Gastrointestinal:  Positive for heartburn (occ relieved with TUMS). Negative for abdominal pain, blood in stool, constipation, diarrhea, melena, nausea and vomiting.  Genitourinary: Negative.  Negative for dysuria and urgency.  Musculoskeletal:  Negative for back pain, falls, joint pain, myalgias and neck pain.  Skin: Negative.  Negative for rash.  Neurological: Negative.  Negative for dizziness, tingling, tremors, sensory change, weakness and headaches.  Endo/Heme/Allergies: Negative.  Negative for polydipsia. Does not  bruise/bleed easily.  Psychiatric/Behavioral:  Positive for depression (improved with medication). Negative for memory loss, substance abuse and suicidal ideas. The patient is not nervous/anxious and does not have insomnia.   All other systems reviewed and are negative.   Physical Exam: Estimated body mass index is 29.95 kg/m as calculated from the following:   Height as of this encounter: 5' 8.25" (1.734 m).   Weight as of this encounter: 198 lb 6.4 oz (90 kg). BP 128/76   Pulse 69   Temp 97.7 F (36.5 C)   Ht 5' 8.25" (1.734 m)   Wt 198 lb 6.4 oz (90 kg)   SpO2 99%   BMI 29.95 kg/m    General Appearance: Well nourished, in no apparent distress. Eyes: PERRLA, EOMs, conjunctiva no swelling or erythema Sinuses: No Frontal/maxillary tenderness ENT/Mouth: Ext aud canals clear, normal light reflex with TMs  without erythema, bulging.  Good dentition. No erythema, swelling, or exudate on post pharynx. Tonsils not swollen or erythematous. Hearing normal.  Neck: Supple, thyroid normal. No bruits Respiratory: Respiratory effort normal, BS equal bilaterally without rales, rhonchi, wheezing or stridor. Cardio: RRR without murmurs, rubs or gallops. Brisk peripheral pulses without edema.  Chest: symmetric, with normal excursions and percussion. Breasts:Defer to GYN Abdomen: Soft, +BS, non tender, negative murphy's, no guarding, rebound, hernias, masses, or organomegaly. .  Lymphatics: Non tender without lymphadenopathy.  Genitourinary: defer to GYN Musculoskeletal: Full ROM all peripheral extremities,5/5 strength, and normal gait. Skin: Warm, dry without rashes, concerning lesions, ecchymosis.  Neuro: Cranial nerves intact, reflexes equal bilaterally. Normal muscle tone, no cerebellar symptoms. Sensation intact.  Psych: Awake and oriented X 3, normal affect, Insight and Judgment appropriate.     Medicare Attestation I have personally reviewed: The patient's medical and social  history Their use of alcohol, tobacco or illicit drugs Their current medications and supplements The patient's functional ability including ADLs,fall risks, home safety risks, cognitive, and hearing and visual impairment Diet and physical activities Evidence for depression or mood disorders  The patient's weight, height, BMI, and visual acuity have been recorded in the chart.  I have made referrals, counseling, and provided education to the patient based on review of the above and I have provided the patient with a written personalized care plan for preventive services.     Darrol Jump, NP 2:37 PM [facility]

## 2023-03-12 LAB — CBC WITH DIFFERENTIAL/PLATELET
Absolute Monocytes: 612 cells/uL (ref 200–950)
Basophils Absolute: 61 cells/uL (ref 0–200)
Basophils Relative: 0.9 %
Eosinophils Absolute: 143 cells/uL (ref 15–500)
Eosinophils Relative: 2.1 %
HCT: 40.2 % (ref 35.0–45.0)
Hemoglobin: 13.6 g/dL (ref 11.7–15.5)
Lymphs Abs: 1877 cells/uL (ref 850–3900)
MCH: 30.8 pg (ref 27.0–33.0)
MCHC: 33.8 g/dL (ref 32.0–36.0)
MCV: 91.2 fL (ref 80.0–100.0)
MPV: 10.1 fL (ref 7.5–12.5)
Monocytes Relative: 9 %
Neutro Abs: 4107 cells/uL (ref 1500–7800)
Neutrophils Relative %: 60.4 %
Platelets: 245 10*3/uL (ref 140–400)
RBC: 4.41 10*6/uL (ref 3.80–5.10)
RDW: 12.1 % (ref 11.0–15.0)
Total Lymphocyte: 27.6 %
WBC: 6.8 10*3/uL (ref 3.8–10.8)

## 2023-03-12 LAB — HEMOGLOBIN A1C
Hgb A1c MFr Bld: 6 % of total Hgb — ABNORMAL HIGH (ref <5.7)
Mean Plasma Glucose: 126 mg/dL
eAG (mmol/L): 7 mmol/L

## 2023-03-12 LAB — LIPID PANEL
Cholesterol: 191 mg/dL (ref <200)
HDL: 85 mg/dL (ref >=50)
LDL Cholesterol (Calc): 88 mg/dL (calc)
Non-HDL Cholesterol (Calc): 106 mg/dL (calc) (ref <130)
Total CHOL/HDL Ratio: 2.2 (calc) (ref <5.0)
Triglycerides: 87 mg/dL (ref <150)

## 2023-03-12 LAB — COMPLETE METABOLIC PANEL WITH GFR
AG Ratio: 2.1 (calc) (ref 1.0–2.5)
ALT: 18 U/L (ref 6–29)
AST: 17 U/L (ref 10–35)
Albumin: 4.5 g/dL (ref 3.6–5.1)
Alkaline phosphatase (APISO): 56 U/L (ref 37–153)
BUN: 13 mg/dL (ref 7–25)
CO2: 26 mmol/L (ref 20–32)
Calcium: 9.6 mg/dL (ref 8.6–10.4)
Chloride: 106 mmol/L (ref 98–110)
Creat: 1.03 mg/dL (ref 0.50–1.05)
Globulin: 2.1 g/dL (calc) (ref 1.9–3.7)
Glucose, Bld: 96 mg/dL (ref 65–99)
Potassium: 4.1 mmol/L (ref 3.5–5.3)
Sodium: 144 mmol/L (ref 135–146)
Total Bilirubin: 0.4 mg/dL (ref 0.2–1.2)
Total Protein: 6.6 g/dL (ref 6.1–8.1)
eGFR: 59 mL/min/{1.73_m2} — ABNORMAL LOW (ref >=60)

## 2023-03-12 LAB — TSH: TSH: 1.12 mIU/L (ref 0.40–4.50)

## 2023-03-13 ENCOUNTER — Encounter: Payer: Self-pay | Admitting: Nurse Practitioner

## 2023-03-13 DIAGNOSIS — M25512 Pain in left shoulder: Secondary | ICD-10-CM | POA: Diagnosis not present

## 2023-03-13 DIAGNOSIS — E669 Obesity, unspecified: Secondary | ICD-10-CM

## 2023-03-13 DIAGNOSIS — M6281 Muscle weakness (generalized): Secondary | ICD-10-CM | POA: Diagnosis not present

## 2023-03-13 MED ORDER — PHENTERMINE HCL 37.5 MG PO TABS: ORAL_TABLET | ORAL | 1 refills | Status: DC

## 2023-03-20 DIAGNOSIS — M25512 Pain in left shoulder: Secondary | ICD-10-CM | POA: Diagnosis not present

## 2023-03-20 DIAGNOSIS — M6281 Muscle weakness (generalized): Secondary | ICD-10-CM | POA: Diagnosis not present

## 2023-04-29 ENCOUNTER — Other Ambulatory Visit: Payer: Self-pay | Admitting: Nurse Practitioner

## 2023-07-10 ENCOUNTER — Other Ambulatory Visit: Payer: Self-pay | Admitting: Nurse Practitioner

## 2023-07-10 DIAGNOSIS — E785 Hyperlipidemia, unspecified: Secondary | ICD-10-CM

## 2023-07-10 DIAGNOSIS — I10 Essential (primary) hypertension: Secondary | ICD-10-CM

## 2023-08-07 ENCOUNTER — Encounter: Payer: Medicare Other | Admitting: Internal Medicine

## 2023-08-22 ENCOUNTER — Other Ambulatory Visit: Payer: Self-pay

## 2023-08-22 ENCOUNTER — Ambulatory Visit
Admission: RE | Admit: 2023-08-22 | Discharge: 2023-08-22 | Disposition: A | Discharge status: Home / Self Care | Payer: Medicare Other | Source: Ambulatory Visit | Attending: Family Medicine | Admitting: Family Medicine

## 2023-08-22 VITALS — BP 145/76 | HR 64 | Temp 98.2°F | Resp 16

## 2023-08-22 DIAGNOSIS — J0100 Acute maxillary sinusitis, unspecified: Secondary | ICD-10-CM | POA: Diagnosis not present

## 2023-08-22 DIAGNOSIS — J309 Allergic rhinitis, unspecified: Secondary | ICD-10-CM

## 2023-08-22 DIAGNOSIS — R059 Cough, unspecified: Secondary | ICD-10-CM

## 2023-08-22 LAB — POC SARS CORONAVIRUS 2 AG -  ED: SARS Coronavirus 2 Ag: NEGATIVE

## 2023-08-22 MED ORDER — BENZONATATE 200 MG PO CAPS: 200.0000 mg | ORAL_CAPSULE | Freq: Three times a day (TID) | ORAL | 0 refills | Status: AC | PRN

## 2023-08-22 MED ORDER — FEXOFENADINE HCL 180 MG PO TABS: 180.0000 mg | ORAL_TABLET | Freq: Every day | ORAL | 0 refills | Status: AC

## 2023-08-22 MED ORDER — AMOXICILLIN-POT CLAVULANATE 875-125 MG PO TABS: 1.0000 | ORAL_TABLET | Freq: Two times a day (BID) | ORAL | 0 refills | Status: AC

## 2023-08-22 NOTE — ED Provider Notes (Signed)
[facility]    CSN: [id number] Arrival date & time: 08/22/23  3474      History   Chief Complaint Chief Complaint  Patient presents with   Cough    Entered by patient    HPI Cheyenne Conway is a 72 y.o. female.   HPI Very pleasant 72 year old female presents with cough on and off for 3 years but has been worsening over the last 7 to 8 days. PMH significant for BCC, HLD, and obesity  Past Medical History:  Diagnosis Date   Anemia    Anxiety    Basal cell carcinoma 08/17/2007   left nostril MOHS   BCC (basal cell carcinoma of skin) 04/06/2013   right nose tx cx3 49fu   Cancer (HCC)    basal cell CA- nose   Colon polyp, hyperplastic    2005   Depression    GERD (gastroesophageal reflux disease)    Hyperlipidemia    Prediabetes    Thyroid disease    Unspecified essential hypertension    Vitamin D deficiency     Patient Active Problem List   Diagnosis Date Noted   CKD (chronic kidney disease) stage 3, GFR 30-59 ml/min (HCC) 01/23/2021   Idiopathic hematuria 07/07/2018   Overweight (BMI 25.0-29.9) 07/03/2018   Stress incontinence 06/12/2015   NASH (nonalcoholic steatohepatitis) 06/12/2015   History of basal cell cancer 06/12/2015   Medication management 11/10/2014   Essential hypertension    Hyperlipidemia    Thyroid disease    Other abnormal glucose (prediabetes)    GERD (gastroesophageal reflux disease)    Depression, major, in remission (HCC)    Vitamin D deficiency     Past Surgical History:  Procedure Laterality Date   BREAST EXCISIONAL BIOPSY Left 2002   BREAST LUMPECTOMY Right    In the 90s   CATARACT EXTRACTION, BILATERAL Bilateral 07/2018   Dr. Wayland Denis   COLONOSCOPY     KNEE ARTHROSCOPY Right 2000   knee "cleanup" of right knee cartiledge, Dr. Francee Piccolo?    MOHS SURGERY     THYROIDECTOMY     In the 86s   TONSILLECTOMY     VAGINAL HYSTERECTOMY  1994    OB History   No obstetric history on file.      Home Medications     Prior to Admission medications   Medication Sig Start Date End Date Taking? Authorizing Provider  amoxicillin-clavulanate (AUGMENTIN) 875-125 MG tablet Take 1 tablet by mouth 2 (two) times daily for 10 days. 08/22/23 09/01/23 Yes Trevor Iha, FNP  benzonatate (TESSALON) 200 MG capsule Take 1 capsule (200 mg total) by mouth 3 (three) times daily as needed for up to 7 days. 08/22/23 08/29/23 Yes Trevor Iha, FNP  fexofenadine [facility] ALLERGY) 180 MG tablet Take 1 tablet (180 mg total) by mouth daily for 15 days. 08/22/23 09/06/23 Yes Trevor Iha, FNP  bisoprolol-hydrochlorothiazide (ZIAC) 2.5-6.25 MG tablet TAKE 1 TABLET BY MOUTH DAILY FOR BLOOD PRESSURE 07/11/23   Raynelle Dick, NP  buPROPion (WELLBUTRIN XL) 300 MG 24 hr tablet TAKE 1 TABLET BY MOUTH EVERY  MORNING 11/13/22   Raynelle Dick, NP  Cholecalciferol (VITAMIN D) 50 MCG (2000 UT) CAPS Take 2 capsules (4,000 Units total) by mouth daily. Patient taking differently: Take 4,000 Units by mouth daily. Taking 10,000 units 07/08/19   Judd Gaudier, NP  Clobetasol Prop Emollient Base (CLOBETASOL PROPIONATE E) 0.05 % emollient cream Apply to affected area qd 04/30/22   Janalyn Harder, MD  ezetimibe (ZETIA)  10 MG tablet Take 1 tablet (10 mg total) by mouth daily. 09/17/22 09/17/23  Raynelle Dick, NP  levothyroxine (SYNTHROID) 100 MCG tablet TAKE EVERY MORNING AS DIRECTED;  TAKE 1/2 TAB BY MOUTH MONDAY,  WEDNESDAY, AND FRIDAY , 1 TAB  ALL OTHER DAYS. WITH ONLY GLASS  OF WATER FOR 30 MINUTES 01/25/23   Raynelle Dick, NP  MAGNESIUM-POTASSIUM PO Take by mouth daily.    [provider]  Multiple Vitamins-Minerals (MULTIVITAMIN PO) Take by mouth daily.    [provider]  OVER THE COUNTER MEDICATION Tumeric daily    [provider]  phentermine (ADIPEX-P) 37.5 MG tablet Take 1/2 to 1 tablet every morning for dieting & weight loss 03/13/23   Adela Glimpse, NP  rosuvastatin (CRESTOR) 40 MG tablet TAKE 1 TABLET BY  MOUTH DAILY FOR CHOLESTEROL 07/11/23   Raynelle Dick, NP  telmisartan (MICARDIS) 80 MG tablet TAKE ONE-HALF TABLET BY MOUTH  DAILY FOR BLOOD PRESSURE.  DISCARD UNUSED 1/2 TABLET 07/11/23   Raynelle Dick, NP    Family History Family History  Problem Relation Age of Onset   Hypertension Mother    Valvular heart disease Mother    Hypertension Father    Alzheimer's disease Father    Diabetes type II Sister    Atrial fibrillation Sister    Colon cancer Neg Hx    Esophageal cancer Neg Hx    Rectal cancer Neg Hx    Stomach cancer Neg Hx    Breast cancer Neg Hx     Social History Social History   Tobacco Use   Smoking status: Former    Current packs/day: 0.00    Average packs/day: 0.5 packs/day for 5.0 years (2.5 ttl pk-yrs)    Types: Cigarettes    Start date: 02/24/2000    Quit date: 02/23/2005    Years since quitting: 18.5   Smokeless tobacco: Never  Vaping Use   Vaping status: Never Used  Substance Use Topics   Alcohol use: Yes    Alcohol/week: 3.0 standard drinks of alcohol    Types: 3 Glasses of wine per week    Comment: on Fridays- wine   Drug use: No     Allergies   Topamax [topiramate], Naldecon senior [guaifenesin], Prednisone, and Benazepril hcl   Review of Systems Review of Systems  HENT:  Positive for sore throat.   Respiratory:  Positive for cough.   All other systems reviewed and are negative.    Physical Exam Triage Vital Signs ED Triage Vitals  Encounter Vitals Group     BP 08/22/23 0941 (!) 145/76     Systolic BP Percentile --      Diastolic BP Percentile --      Pulse Rate 08/22/23 0941 64     Resp 08/22/23 0941 16     Temp 08/22/23 0941 98.2 F (36.8 C)     Temp Source 08/22/23 0941 Oral     SpO2 08/22/23 0941 98 %     Weight --      Height --      Head Circumference --      Peak Flow --      Pain Score 08/22/23 0942 3     Pain Loc --      Pain Education --      Exclude from Growth Chart --    No data found.  Updated Vital  Signs BP (!) 145/76 (BP Location: Left Arm)   Pulse 64   Temp  98.2 F (36.8 C) (Oral)   Resp 16   SpO2 98%    Physical Exam Vitals and nursing note reviewed.  Constitutional:      Appearance: Normal appearance. She is obese. She is ill-appearing.  HENT:     Head: Normocephalic and atraumatic.     Right Ear: Tympanic membrane and external ear normal.     Left Ear: Tympanic membrane and external ear normal.     Ears:     Comments: Moderate eustachian tube dysfunction noted bilaterally    Mouth/Throat:     Mouth: Mucous membranes are moist.     Pharynx: Oropharynx is clear.  Eyes:     Extraocular Movements: Extraocular movements intact.     Conjunctiva/sclera: Conjunctivae normal.     Pupils: Pupils are equal, round, and reactive to light.  Cardiovascular:     Rate and Rhythm: Normal rate and regular rhythm.     Pulses: Normal pulses.     Heart sounds: Normal heart sounds.  Pulmonary:     Effort: Pulmonary effort is normal.     Breath sounds: Normal breath sounds. No wheezing, rhonchi or rales.     Comments: Infrequent nonproductive cough noted on exam Musculoskeletal:        General: Normal range of motion.     Cervical back: Normal range of motion and neck supple.  Skin:    General: Skin is warm and dry.  Neurological:     General: No focal deficit present.     Mental Status: She is alert and oriented to person, place, and time. Mental status is at baseline.  Psychiatric:        Mood and Affect: Mood normal.        Behavior: Behavior normal.        Thought Content: Thought content normal.      UC Treatments / Results  Labs (all labs ordered are listed, but only abnormal results are displayed) Labs Reviewed  POC SARS CORONAVIRUS 2 AG -  ED    EKG   Radiology No results found.  Procedures Procedures (including critical care time)  Medications Ordered in UC Medications - No data to display  Initial Impression / Assessment and Plan / UC Course  I have  reviewed the triage vital signs and the nursing notes.  Pertinent labs & imaging results that were available during my care of the patient were reviewed by me and considered in my medical decision making (see chart for details).     MDM 1.  Acute maxillary sinusitis, recurrence not specified-Rx'd Augmentin 875/125 mg tablet twice daily x 10 days; 2.  Allergic rhinitis, unspecified seasonality, unspecified trigger-Rx'd Allegra 180 mg fexofenadine daily x 5 days, then as needed for concurrent postnasal drainage/drip; 3.  Cough, unspecified type-Rx'd Tessalon capsules 200 mg 3 times daily, as needed. Advised patient to take medication as directed with food to completion.  Advised patient to take Allegra with first dose of Augmentin for the next 5 of 10 days.  Advised may use Allegra as needed afterwards for concurrent postnasal drainage/drip.  Advised may use Tessalon capsules daily or as needed for cough.  Encouraged increase daily water intake to 64 ounces per day while taking these medications.  Advised if symptoms worsen and/or unresolved please follow-up with PCP or here for further evaluation.  Final Clinical Impressions(s) / UC Diagnoses   Final diagnoses:  Cough, unspecified type  Acute maxillary sinusitis, recurrence not specified  Allergic rhinitis, unspecified seasonality, unspecified trigger  Discharge Instructions      Advised patient to take medication as directed with food to completion.  Advised patient to take Allegra with first dose of Augmentin for the next 5 of 10 days.  Advised may use Allegra as needed afterwards for concurrent postnasal drainage/drip.  Advised may use Tessalon capsules daily or as needed for cough.  Encouraged increase daily water intake to 64 ounces per day while taking these medications.  Advised if symptoms worsen and/or unresolved please follow-up with PCP or here for further evaluation.     ED Prescriptions     Medication Sig Dispense Auth.  Provider   amoxicillin-clavulanate (AUGMENTIN) 875-125 MG tablet Take 1 tablet by mouth 2 (two) times daily for 10 days. 20 tablet Trevor Iha, FNP   fexofenadine [facility] ALLERGY) 180 MG tablet Take 1 tablet (180 mg total) by mouth daily for 15 days. 15 tablet Trevor Iha, FNP   benzonatate (TESSALON) 200 MG capsule Take 1 capsule (200 mg total) by mouth 3 (three) times daily as needed for up to 7 days. 40 capsule Trevor Iha, FNP      PDMP not reviewed this encounter.   Trevor Iha, FNP 08/22/23 1026

## 2023-08-22 NOTE — Discharge Instructions (Addendum)
Advised patient to take medication as directed with food to completion.  Advised patient to take Allegra with first dose of Augmentin for the next 5 of 10 days.  Advised may use Allegra as needed afterwards for concurrent postnasal drainage/drip.  Advised may use Tessalon capsules daily or as needed for cough.  Encouraged increase daily water intake to 64 ounces per day while taking these medications.  Advised if symptoms worsen and/or unresolved please follow-up with PCP or here for further evaluation.

## 2023-08-22 NOTE — ED Triage Notes (Addendum)
Cough x 3 years but worse lately the past 3 days. No fever. Scratchy throat and headache also.

## 2023-08-26 ENCOUNTER — Other Ambulatory Visit: Payer: Self-pay | Admitting: Nurse Practitioner

## 2023-08-26 DIAGNOSIS — E079 Disorder of thyroid, unspecified: Secondary | ICD-10-CM

## 2023-09-15 ENCOUNTER — Other Ambulatory Visit: Payer: Self-pay | Admitting: Nurse Practitioner

## 2023-09-15 DIAGNOSIS — E785 Hyperlipidemia, unspecified: Secondary | ICD-10-CM

## 2023-09-21 ENCOUNTER — Encounter: Payer: Self-pay | Admitting: Internal Medicine

## 2023-09-21 NOTE — Progress Notes (Unsigned)
Annual Screening/Preventative Visit &  Comprehensive Evaluation &  Examination  Future Appointments  Date Time Provider Department  09/22/2023            cpe  2:00 PM Lucky Cowboy, MD [facility]  03/22/2024           wellness  4:00 PM Adela Glimpse, NP [facility]  10/04/2024            cpe  2:00 PM Lucky Cowboy, MD [facility]        This very nice 73 y.o. MWF ( patient since 1993 ) presents for a Screening /Preventative Visit & comprehensive evaluation and management of multiple medical co-morbidities.  Patient has been followed for HTN, HLD, Prediabetes  and Vitamin D Deficiency.        HTN predates since 2008. Patient's BP has been controlled at home and patient denies any cardiac symptoms as chest pain, palpitations, shortness of breath, dizziness or ankle swelling. Patient has CKD3a (GFR 47-54 ) attributed to her HTN.  Today's          Patient's hyperlipidemia is controlled with diet and Rosuvastatin & Ezetimibe  since 2008. Patient denies myalgias or other medication SE's. Last lipids were   Lab Results  Component Value Date   CHOL 191 03/11/2023   HDL 85 03/11/2023   LDLCALC 88 03/11/2023   TRIG 87 03/11/2023   CHOLHDL 2.2 03/11/2023         Patient has hx/o prediabetes ( A1c 6.1% / 2014)  and patient denies reactive hypoglycemic symptoms, visual blurring, diabetic polys or paresthesias. Last A1c was   Lab Results  Component Value Date   HGBA1C 6.0 (H) 03/11/2023         Finally, patient has history of Vitamin D Deficiency("35" /2017) and last Vitamin D was at goal :  Lab Results  Component Value Date   VD25OH 60 09/16/2022     Current Outpatient Medications on File Prior to Visit  Medication Sig   bisoprolol-hctz 2.5-6.25 MG tablet TAKE 1 TABLET DAILY    buPROPion  XL 300 MG  TAKE 1 TABLET BY MOUTH EVERY  MORNING   VITAMIN D  Taking 10,000 units   CLOBETASOL PROPIONATE Apply to affected area qd   ezetimibe  10 MG tablet TAKE 1 TABLET(10 MG) BY  MOUTH DAILY   fexofenadine  180 MG tablet Take 1 tablet  daily for 15 days.   levothyroxine 100 MCG tablet TAKE ONE-HALF TABLET EVERY MORNING ON MONDAY,  WEDNESDAY, AND FRIDAY AND 1  TABLET OTHER DAYS    MAGNESIUM-POTASSIUM Take  daily.   Multiple Vitamins-Minerals  Take  daily.   Tumeric daily   phentermine  37.5 MG tablet Take 1/2 to 1 tablet every morning    rosuvastatin  40 MG tablet TAKE 1 TABLET  DAILY F   telmisartan  80 MG tablet TAKE ONE-HALF TABLET DAILY      Allergies  Allergen Reactions   Topamax [Topiramate] Shortness Of Breath   Naldecon Senior [Guaifenesin]    Prednisone     Unsure of reaction   Benazepril Hcl Other (See Comments) and Cough        Health Maintenance  Topic Date Due   Zoster Vaccines- Shingrix (1 of 2) Never done   MAMMOGRAM  04/18/2023   INFLUENZA VACCINE  07/31/2023   COVID-19 Vaccine (5 - 2023-24 season) 08/31/2023   Medicare Annual Wellness (AWV)  03/10/2024   Colonoscopy  05/03/2024   DTaP/Tdap/Td (3 - Td or  Tdap) 06/02/2024   Pneumonia Vaccine 74+ Years old  Completed   DEXA SCAN  Completed   Hepatitis C Screening  Completed   HPV VACCINES  Aged Out     Immunization History  Administered Date(s) Administered   Influenza, High Dose  09/29/2021   Influenza 10/12/2019, 09/28/2020   MMR 05/30/2014   PFIZER  SARS-COV-2 Vacc 02/24/2020, 03/21/2020, 10/14/2020, 10/03/2021   Pneumococcal -13 07/06/2018   Pneumococcal-23 03/25/2006, 07/07/2019   Td 03/15/2004   Tdap 06/02/2014     Last Colon - 05/03/2014 - Dr Arlyce Dice recc 10 year f/u    Last MGM - 04/17/2021  Last DexaBMD - 05/30/2022 -  Normal   Past Surgical History:  Procedure Laterality Date   BREAST EXCISIONAL BIOPSY Left 2002   BREAST LUMPECTOMY Right    In the 90s   CATARACT EXTRACTION, BILATERAL Bilateral 07/2018   Dr. Wayland Denis   COLONOSCOPY     KNEE ARTHROSCOPY Right 2000   knee "cleanup" of right knee cartiledge, Dr. Francee Piccolo?    MOHS SURGERY      THYROIDECTOMY     In the 61s   TONSILLECTOMY     VAGINAL HYSTERECTOMY  1994     Family History  Problem Relation Age of Onset   Hypertension Mother    Valvular heart disease Mother    Hypertension Father    Alzheimer's disease Father    Diabetes type II Sister    Atrial fibrillation Sister    Colon cancer Neg Hx    Esophageal cancer Neg Hx    Rectal cancer Neg Hx    Stomach cancer Neg Hx    Breast cancer Neg Hx      Social History   Tobacco Use   Smoking status: Former    Current packs/day: 0.00    Average packs/day: 0.5 packs/day for 5.0 years (2.5 ttl pk-yrs)    Types: Cigarettes    Start date: 02/24/2000    Quit date: 02/23/2005    Years since quitting: 18.5   Smokeless tobacco: Never  Vaping Use   Vaping status: Never Used  Substance Use Topics   Alcohol use: Yes    Alcohol/week: 3.0 standard drinks of alcohol    Types: 3 Glasses of wine per week    Comment: on Fridays- wine   Drug use: No      ROS Constitutional: Denies fever, chills, weight loss/gain, headaches, insomnia,  night sweats, and change in appetite. Does c/o fatigue. Eyes: Denies redness, blurred vision, diplopia, discharge, itchy, watery eyes.  ENT: Denies discharge, congestion, post nasal drip, epistaxis, sore throat, earache, hearing loss, dental pain, Tinnitus, Vertigo, Sinus pain, snoring.  Cardio: Denies chest pain, palpitations, irregular heartbeat, syncope, dyspnea, diaphoresis, orthopnea, PND, claudication, edema Respiratory: denies cough, dyspnea, DOE, pleurisy, hoarseness, laryngitis, wheezing.  Gastrointestinal: Denies dysphagia, heartburn, reflux, water brash, pain, cramps, nausea, vomiting, bloating, diarrhea, constipation, hematemesis, melena, hematochezia, jaundice, hemorrhoids Genitourinary: Denies dysuria, frequency, urgency, nocturia, hesitancy, discharge, hematuria, flank pain Breast: Breast lumps, nipple discharge, bleeding.  Musculoskeletal: Denies arthralgia, myalgia,  stiffness, Jt. Swelling, pain, limp, and strain/sprain. Denies falls. Skin: Denies puritis, rash, hives, warts, acne, eczema, changing in skin lesion Neuro: No weakness, tremor, incoordination, spasms, paresthesia, pain Psychiatric: Denies confusion, memory loss, sensory loss. Denies Depression. Endocrine: Denies change in weight, skin, hair change, nocturia, and paresthesia, diabetic polys, visual blurring, hyper / hypo glycemic episodes.  Heme/Lymph: No excessive bleeding, bruising, enlarged lymph nodes.  Physical Exam  There were no vitals taken for this visit.  General Appearance: Well nourished, well groomed and in no apparent distress.  Eyes: PERRLA, EOMs, conjunctiva no swelling or erythema, normal fundi and vessels. Sinuses: No frontal/maxillary tenderness ENT/Mouth: EACs patent / TMs  nl. Nares clear without erythema, swelling, mucoid exudates. Oral hygiene is good. No erythema, swelling, or exudate. Tongue normal, non-obstructing. Tonsils not swollen or erythematous. Hearing normal.  Neck: Supple, thyroid not palpable. No bruits, nodes or JVD. Respiratory: Respiratory effort normal.  BS equal and clear bilateral without rales, rhonci, wheezing or stridor. Cardio: Heart sounds are normal with regular rate and rhythm and no murmurs, rubs or gallops. Peripheral pulses are normal and equal bilaterally without edema. No aortic or femoral bruits. Chest: symmetric with normal excursions and percussion. Breasts: Symmetric, without lumps, nipple discharge, retractions, or fibrocystic changes.  Abdomen: Flat, soft with bowel sounds active. Nontender, no guarding, rebound, hernias, masses, or organomegaly.  Lymphatics: Non tender without lymphadenopathy.  Genitourinary:  Musculoskeletal: Full ROM all peripheral extremities, joint stability, 5/5 strength, and normal gait. Skin: Warm and dry without rashes, lesions, cyanosis, clubbing or  ecchymosis.  Neuro: Cranial nerves intact, reflexes  equal bilaterally. Normal muscle tone, no cerebellar symptoms. Sensation intact.  Pysch: Alert and oriented X 3, normal affect, Insight and Judgment appropriate.    Assessment and Plan   1. Annual Preventative Screening Examination   2. Essential hypertension  - EKG 12-Lead - Urinalysis, Routine w reflex microscopic - Microalbumin / creatinine urine ratio - CBC with Differential/Platelet - COMPLETE METABOLIC PANEL WITH GFR - Magnesium - TSH   3. Hyperlipidemia, mixed  - EKG 12-Lead - Lipid panel - TSH   4. Abnormal glucose  - EKG 12-Lead - Hemoglobin A1c - Insulin, random   5. Vitamin D deficiency  - EKG 12-Lead - VITAMIN D 25 Hydroxy    6. Stage 3 chronic kidney disease, stage 3a or 3b CKD (HCC)  - Urinalysis, Routine w reflex microscopic - Microalbumin / creatinine urine ratio - COMPLETE METABOLIC PANEL WITH GFR   7. NASH (nonalcoholic steatohepatitis)  - COMPLETE METABOLIC PANEL WITH GFR   8. Gastroesophageal reflux disease \  - CBC with Differential/Platelet   9. Screening for heart disease  - EKG 12-Lead   10. FHx: heart disease  - EKG 12-Lead   11. Former smoker  - EKG 12-Lead   12. Medication management  - Urinalysis, Routine w reflex microscopic - Microalbumin / creatinine urine ratio - CBC with Differential/Platelet - COMPLETE METABOLIC PANEL WITH GFR - Magnesium - Lipid panel - TSH - Hemoglobin A1c - Insulin, random - VITAMIN D 25 Hydroxy    13. Screening for colorectal cancer  - POC Hemoccult Bld/Stl            Patient was counseled in prudent diet to achieve/maintain BMI less than 25 for weight control, BP monitoring, regular exercise and medications. Discussed med's effects and SE's. Screening labs and tests as requested with regular follow-up as recommended. Over 40 minutes of exam, counseling, chart review and high complex critical decision making was performed.   Marinus Maw, MD

## 2023-09-21 NOTE — Patient Instructions (Signed)
Due to recent changes in healthcare laws, you may see the results of your imaging and laboratory studies on MyChart before your provider has had a chance to review them.  We understand that in some cases there may be results that are confusing or concerning to you. Not all laboratory results come back in the same time frame and the provider may be waiting for multiple results in order to interpret others.  Please give [place] 48 hours in order for your provider to thoroughly review all the results before contacting the office for clarification of your results.   +++++++++++++++++++++++++++++  Vit D  & Vit C 1,000 mg   are recommended to help protect  against the Covid-19 and other Corona viruses.    Also it's recommended  to take  Zinc 50 mg  to help  protect against the Covid-19   and best place to get  is also on [email or web address]  and don't pay more than 6-8 cents /pill !  ================================== Coronavirus (COVID-19) Are you at risk?  Are you at risk for the Coronavirus (COVID-19)?  To be considered HIGH RISK for Coronavirus (COVID-19), you have to meet the following criteria:  Traveled to [place], [place], [place], [place] or [place]; or in the [place] to [place], [place], [place]  or [place]; and have fever, cough, and shortness of breath within the last 2 weeks of travel OR Been in close contact with a person diagnosed with COVID-19 within the last 2 weeks and have  fever, cough,and shortness of breath  IF YOU DO NOT MEET THESE CRITERIA, YOU ARE CONSIDERED LOW RISK FOR COVID-19.  What to do if you are HIGH RISK for COVID-19?  If you are having a medical emergency, call 911. Seek medical care right away. Before you go to a doctor's office, urgent care or emergency department,  call ahead and tell them about your recent travel, contact with someone diagnosed with COVID-19   and your symptoms.  You should receive instructions from your physician's office regarding  next steps of care.  When you arrive at healthcare provider, tell the healthcare staff immediately you have returned from  visiting [place], [place], [place], [place] or [place]; or traveled in the [place] to [place], [place],  [place] or [place] in the last two weeks or you have been in close contact with a person diagnosed with  COVID-19 in the last 2 weeks.   Tell the health care staff about your symptoms: fever, cough and shortness of breath. After you have been seen by a medical provider, you will be either: Tested for (COVID-19) and discharged home on quarantine except to seek medical care if  symptoms worsen, and asked to  Stay home and avoid contact with others until you get your results (4-5 days)  Avoid travel on public transportation if possible (such as bus, train, or airplane) or Sent to the Emergency Department by EMS for evaluation, COVID-19 testing  and  possible admission depending on your condition and test results.  What to do if you are LOW RISK for COVID-19?  Reduce your risk of any infection by using the same precautions used for avoiding the common cold or flu:  Wash your hands often with soap and warm water for at least 20 seconds.  If soap and water are not readily available,  use an alcohol-based hand sanitizer with at least 60% alcohol.  If coughing or sneezing, cover your mouth and nose by coughing  or sneezing into the elbow areas of your shirt or coat,  into a tissue or into your sleeve (not your hands). Avoid shaking hands with others and consider head nods or verbal greetings only. Avoid touching your eyes, nose, or mouth with unwashed hands.  Avoid close contact with people who are sick. Avoid places or events with large numbers of people in one location, like concerts or sporting events. Carefully consider travel plans you have or are making. If you are planning any travel outside or inside the [place], visit the CDC's Travelers' Health webpage for  the latest health notices. If you have some symptoms but not all symptoms, continue to monitor at home and seek medical attention  if your symptoms worsen. If you are having a medical emergency, call 911. >>>>>>>>>>>>>>>>>>>>>>> Preventive Care for Adults  A healthy lifestyle and preventive care can promote health and wellness. Preventive health guidelines for women include the following key practices. A routine yearly physical is a good way to check with your health care provider about your health and preventive screening.  It is a chance to share any concerns and updates on your health and to receive a thorough exam. Visit your dentist for a routine exam and preventive care every 6 months. Brush your teeth twice a day and floss once a day.  Good oral hygiene prevents tooth decay and gum disease. The frequency of eye exams is based on your age, health, family medical history, use of contact lenses, and other factors.  Follow your health care provider's recommendations for frequency of eye exams. Eat a healthy diet. Foods like vegetables, fruits, whole grains, low-fat dairy products, and lean protein foods contain the nutrients  you need without too many calories. Decrease your intake of foods high in solid fats, added sugars, and salt.  Eat the right amount of calories for you. Get information about a proper diet from your health care provider, if necessary. Regular physical exercise is one of the most important things you can do for your health. Most adults should get at least 150 minutes  of moderate-intensity exercise (any activity that increases your heart rate and causes you to sweat) each week. In addition,  most adults need muscle-strengthening exercises on 2 or more days a week. Maintain a healthy weight. The body mass index (BMI) is a screening tool to identify possible weight problems. It provides  an estimate of body fat based on height and weight. Your health care provider can find  your BMI and can help you achieve  or maintain a healthy weight. For adults 20 years and older: A BMI below 18.5 is considered underweight. A BMI of 18.5 to 24.9 is normal. A BMI of 25 to 29.9 is considered overweight. A BMI of 30 and above is considered obese. Maintain normal blood lipids and cholesterol levels by exercising and minimizing your intake of saturated fat. Eat a balanced  diet with plenty of fruit and vegetables. Blood tests for lipids and cholesterol should begin at age 4 and be repeated every 5 years.   If your lipid or cholesterol levels are high, you are over 50, or you are at high risk for heart disease, you may need your  cholesterol levels checked more frequently. Ongoing high lipid and cholesterol levels should be treated with medicines  if diet and exercise are not working. If you smoke, find out from your health care provider how to quit. If you do not use tobacco, do not start. Lung cancer screening  is recommended for adults aged 55-80 years who are at high risk for developing lung cancer because  of a history of smoking. A yearly low-dose CT scan of the lungs is recommended for people who have at least a 30-pack-year  history of smoking and are a current smoker or have quit within the past 15 years. A pack year of smoking is smoking an  average of 1 pack of cigarettes a day for 1 year (for example: 1 pack a day for 30 years or 2 packs a day for 15 years).  Yearly screening should continue until the smoker has stopped smoking for at least 15 years. Yearly screening should be stopped  for people who develop a health problem that would prevent them from having lung cancer treatment. If you are pregnant, do not drink alcohol. If you are breastfeeding, be very cautious about drinking alcohol. If you are not pregnant  and choose to drink alcohol, do not have more than 1 drink per day. One drink is considered to be 12 ounces (355 mL) of beer,  5 ounces (148 mL) of wine, or  1.5 ounces (44 mL) of liquor. Avoid use of street drugs. Do not share needles with anyone. Ask for help if you need support or instructions  about stopping the use of drugs. High blood pressure causes heart disease and increases the risk of stroke. Your blood pressure should be checked at least  every 1 to 2 years. Ongoing high blood pressure should be treated with medicines if weight loss and exercise do not work. If you are 51-109 years old, ask your health care provider if you should take aspirin to prevent strokes. Diabetes screening involves taking a blood sample to check your fasting blood sugar level. This should be done once every 3 years,  after age 74, if you are within normal weight and without risk factors for diabetes. Testing should be considered at a younger age or  be carried out more frequently if you are overweight and have at least 1 risk factor for diabetes. Breast cancer screening is essential preventive care for women. You should practice "breast self-awareness." This means  understanding the normal appearance and feel of your breasts and may include breast self-examination. Any changes detected,  no matter how small, should be reported to a health care provider. Women in their 78s and 30s should have a clinical  breast exam (CBE) by a health care provider as part of a regular health exam every 1 to 3 years. After age 76, women should  have a CBE every year. Starting at age 58, women should consider having a mammogram (breast X-ray test) every year.  Women who have a family history of breast cancer should talk to their health care provider about genetic screening.  Women at a high risk of breast cancer should talk to their health care providers about having an MRI and a  mammogram every year. Breast cancer gene (BRCA)-related cancer risk assessment is recommended for women who have family members with  BRCA-related cancers. BRCA-related cancers include breast, ovarian, tubal,  and peritoneal cancers. Having family members  with these cancers may be associated with an increased risk for harmful changes (mutations) in the breast cancer  genes BRCA1 and BRCA2. Results of the assessment will determine the need for genetic counseling and BRCA1 and BRCA2 testing. Routine pelvic exams to screen for cancer are no longer recommended for nonpregnant women who are considered low r isk for cancer of the pelvic  organs (ovaries, uterus, and vagina) and who do not have symptoms. Ask your health care provider  if a screening pelvic exam is right for you. If you have had past treatment for cervical cancer or a condition that could lead to cancer, you need Pap tests and screening  for cancer for at least 20 years after your treatment. If Pap tests have been discontinued, your risk factors (such as having a  new sexual partner) need to be reassessed to determine if screening should be resumed. Some women have medical problems  that increase the chance of getting cervical cancer. In these cases, your health care provider may recommend more  frequent screening and Pap tests. The HPV test is an additional test that may be used for cervical cancer screening. The HPV test looks for the virus that can  cause the cell changes on the cervix. The cells collected during the Pap test can be tested for HPV. The HPV test could be used to  screen women aged 61 years and older, and should be used in women of any age who have unclear Pap test results.  After the age of 9, women should have HPV testing at the same frequency as a Pap test. Colorectal cancer can be detected and often prevented. Most routine colorectal cancer screening begins at the age of 48 years  and continues through age 30 years. However, your health care provider may recommend screening at an earlier age if you have  risk factors for colon cancer. On a yearly basis, your health care provider may provide home test kits to check for  hidden blood  in the stool. Use of a small camera at the end of a tube, to directly examine the colon (sigmoidoscopy or colonoscopy), can detect  the earliest forms of colorectal cancer. Talk to your health care provider about this at age 17, when routine screening begins.   Direct exam of the colon should be repeated every 5-10 years through age 69 years, unless early forms of pre-cancerous  polyps or small growths are found. People who are at an increased risk for hepatitis B should be screened for this virus. You are considered at  high risk for hepatitis B if: You were born in a country where hepatitis B occurs often. Talk with your health care provider about which countries are  considered high risk. Your parents were born in a high-risk country and you have not received a shot to protect against  hepatitis B (hepatitis B vaccine). You have HIV or AIDS. You use needles to inject street drugs. You live with, or have sex with, someone who has hepatitis B. You get hemodialysis treatment. You take certain medicines for conditions like cancer, organ transplantation, and autoimmune conditions. Hepatitis C blood testing is recommended for all people born from [birth year] through 1963 and any individual with known  risks for hepatitis C. Practice safe sex. Use condoms and avoid high-risk sexual practices to reduce the spread of sexually transmitted  infections (STIs). STIs include gonorrhea, chlamydia, syphilis, trichomonas, herpes, HPV, and human immunodeficiency  virus (HIV). Herpes, HIV, and HPV are viral illnesses that have no cure. They can result in disability, cancer, and death. You should be screened for sexually transmitted illnesses (STIs) including gonorrhea and chlamydia if: You are sexually active and are younger than 24 years. You are older than 24 years and your health care provider tells you that you are at risk for this type of infection. Your sexual activity has changed  since you were  last screened and you are at an increased risk for chlamydia or  gonorrhea. Ask your health care provider if you are at risk. If you are at risk of being infected with HIV, it is recommended that you take a prescription medicine daily to prevent  HIV infection. This is called preexposure prophylaxis (PrEP). You are considered at risk if: You are a heterosexual woman, are sexually active, and are at increased risk for HIV infection. You take drugs by injection. You are sexually active with a partner who has HIV. Talk with your health care provider about whether you are at high risk of being infected with HIV. If you choose to begin  PrEP, you should first be tested for HIV. You should then be tested every 3 months for as long as you are taking PrEP. Osteoporosis is a disease in which the bones lose minerals and strength with aging. This can result in serious bone fractures o r breaks. The risk of osteoporosis can be identified using a bone density scan. Women ages 8 years and over and women at  risk for fractures or osteoporosis should discuss screening with their health care providers. Ask your health care provider  whether you should take a calcium supplement or vitamin D to reduce the rate of osteoporosis. Menopause can be associated with physical symptoms and risks. Hormone replacement therapy is available to  decrease symptoms and risks. You should talk to your health care provider about whether hormone replacement therapy  is right for you. Use sunscreen. Apply sunscreen liberally and repeatedly throughout the day. You should seek shade when your shadow is  shorter than you. Protect yourself by wearing long sleeves, pants, a wide-brimmed hat, and sunglasses year round,  whenever you are outdoors. Once a month, do a whole body skin exam, using a mirror to look at the skin on your back. Tell your health care provider of new  moles, moles that have irregular borders, moles that are larger than  a pencil eraser, or moles that have changed in shape or color. Stay current with required vaccines (immunizations). Influenza vaccine. All adults should be immunized every year. Tetanus, diphtheria, and acellular pertussis (Td, Tdap) vaccine. Pregnant women should receive 1 dose of Tdap vaccine  during each pregnancy. The dose should be obtained regardless of the length of time since the last dose. Immunization is  preferred during the 27th-36th week of gestation. An adult who has not previously received Tdap or who does not know  her vaccine status should receive 1 dose of Tdap. This initial dose should be followed by tetanus and diphtheria toxoids  (Td) booster doses every 10 years. Adults with an unknown or incomplete history of completing a 3-dose immunization  series with Td-containing vaccines should begin or complete a primary immunization series including a Tdap dose.  Adults should receive a Td booster every 10 years. Varicella vaccine. An adult without evidence of immunity to varicella should receive 2 doses or a second dose if she has  previously received 1 dose. Pregnant females who do not have evidence of immunity should receive the first dose  after pregnancy. This first dose should be obtained before leaving the health care facility. The second dose should be  obtained 4-8 weeks after the first dose. Human papillomavirus (HPV) vaccine. Females aged 13-26 years who have not received the vaccine previously should  obtain the 3-dose series. The vaccine is not recommended for use in pregnant females. However, pregnancy testing is  not  needed before receiving a dose. If a female is found to be pregnant after receiving a dose, no treatment is needed.  In that case, the remaining doses should be delayed until after the pregnancy. Immunization is recommended for any person  with an immunocompromised condition through the age of 26 years if she did not get any or all doses earlier.  During  the 3-dose series, the second dose should be obtained 4-8 weeks after the first dose. The third dose should be obtained  24 weeks after the first dose and 16 weeks after the second dose. Zoster vaccine. One dose is recommended for adults aged 65 years or older unless certain conditions are present. Measles, mumps, and rubella (MMR) vaccine. Adults born before [birth year] generally are considered immune to measles and  mumps. Adults born in [birth year] or later should have 1 or more doses of MMR vaccine unless there is a contraindication to the  vaccine or there is laboratory evidence of immunity to each of the three diseases. A routine second dose of MMR vaccine  should be obtained at least 28 days after the first dose for students attending postsecondary schools, health care workers  or international travelers. People who received inactivated measles vaccine or an unknown type of measles vaccine during  1963-1967 should receive 2 doses of MMR vaccine. People who received inactivated mumps vaccine or an unknown type of  mumps vaccine before 1979 and are at high risk for mumps infection should consider immunization with 2 doses of MMR  vaccine. For females of childbearing age, rubella immunity should be determined. If there is no evidence of immunity,  females who are not pregnant should be vaccinated. If there is no evidence of immunity, females who are pregnant should  delay immunization until after pregnancy. Unvaccinated health care workers born before [birth year] who lack laboratory  evidence of measles, mumps, or rubella immunity or laboratory confirmation of disease should consider measles and  mumps immunization with 2 doses of MMR vaccine or rubella immunization with 1 dose of MMR vaccine. Pneumococcal 13-valent conjugate (PCV13) vaccine. When indicated, a person who is uncertain of her immunization history  and has no record of immunization should receive the PCV13 vaccine. An adult aged 49 years or older who  has certain medical  conditions and has not been previously immunized should receive 1 dose of PCV13 vaccine. This PCV13 should be followed with  a dose of pneumococcal polysaccharide (PPSV23) vaccine. The PPSV23 vaccine dose should be obtained at least 8 weeks after  the dose of PCV13 vaccine. An adult aged 72 years or older who has certain medical conditions and previously received 1 or  more doses of PPSV23 vaccine should receive 1 dose of PCV13. The PCV13 vaccine dose should be obtained 1 or more years  after the last PPSV23 vaccine dose. Pneumococcal polysaccharide (PPSV23) vaccine. When PCV13 is also indicated, PCV13 should be obtained first.  All adults aged 61 years and older should be immunized. An adult younger than age 58 years who has certain medical  conditions should be immunized. Any person who resides in a nursing home or long-term care facility should be  immunized. An adult smoker should be immunized. People with an immunocompromised condition and certain  other conditions should receive both PCV13 and PPSV23 vaccines. People with human immunodeficiency virus (HIV)  infection should be immunized as soon as possible after diagnosis. Immunization during chemotherapy or radiation  therapy should be avoided. Routine use of PPSV23 vaccine is  not recommended for American Indians, [place]  or people younger than 65 years unless there are medical conditions that require PPSV23 vaccine. When indicated,  people who have unknown immunization and have no record of immunization should receive PPSV23 vaccine.  One-time revaccination 5 years after the first dose of PPSV23 is recommended for people aged 19-64 years who have  chronic kidney failure, nephrotic syndrome, asplenia, or immunocompromised conditions. People who received 1-2 doses  of PPSV23 before age 69 years should receive another dose of PPSV23 vaccine at age 72 years or later if at least 5 years  have passed since the  previous dose. Doses of PPSV23 are not needed for people immunized with PPSV23 at or after  age 32 years. Meningococcal vaccine. Adults with asplenia or persistent complement component deficiencies should receive 2 doses of  quadrivalent meningococcal conjugate (MenACWY-D) vaccine. The doses should be obtained at least 2 months apart.  Microbiologists working with certain meningococcal bacteria, military recruits, people at risk during an outbreak  and  people who travel to or live in countries with a high rate of meningitis should be immunized. A first-year college student  up through age 105 years who is living in a residence hall should receive a dose if she did not receive a dose on or after her  16th birthday. Adults who have certain high-risk conditions should receive one or more doses of vaccine. Hepatitis A vaccine. Adults who wish to be protected from this disease, have certain high-risk conditions, work with  hepatitis A-infected animals, work in hepatitis A research labs, or travel to or work in countries with a high rate of  hepatitis A should be immunized. Adults who were previously unvaccinated and who anticipate close contact with  an international adoptee during the first 60 days after arrival in the [place] from a country with a high rate  of hepatitis A should be immunized. Hepatitis B vaccine. Adults who wish to be protected from this disease, have certain high-risk conditions, may be  exposed to blood or other infectious body fluids, are household contacts or sex partners of hepatitis B positive people,  are clients or workers in certain care facilities, or travel to or work in countries with a high rate of  hepatitis B should be immunized. Haemophilus influenzae type b (Hib) vaccine. A previously unvaccinated person with asplenia or sickle cell disease  or having a scheduled splenectomy should receive 1 dose of Hib vaccine. Regardless of previous immunization, a recipient   of a hematopoietic stem cell transplant should receive a 3-dose series 6-12 months after her successful transplant.  Hib vaccine is not recommended for adults with HIV infection. Preventive Services / Frequency  Ages 56 to 39 years Blood pressure check. Lipid and cholesterol check. Clinical breast exam.** / Every 3 years for women in their 101s and 30s. BRCA-related cancer risk assessment.** / For women who have family members with a BRCA-related cancer  (breast, ovarian, tubal, or peritoneal cancers). Pap test.** / Every 2 years from ages 70 through 98. Every 3 years starting at age 52 through age 32 or 53 with a  history of 3 consecutive normal Pap tests. HPV screening.** / Every 3 years from ages 40 through ages 45 to 30 with a history of 3 consecutive normal Pap tests. Hepatitis C blood test.** / For any individual with known risks for hepatitis C. Skin self-exam. / Monthly. Influenza vaccine. / Every year. Tetanus, diphtheria, and acellular pertussis (Tdap, Td) vaccine.** / Consult  your health care provider. Pregnant women  should receive 1 dose of Tdap vaccine during each pregnancy. 1 dose of Td every 10 years. Varicella vaccine.** / Consult your health care provider. Pregnant females who do not have evidence of immunity should  receive the first dose after pregnancy. HPV vaccine. / 3 doses over 6 months, if 26 and younger. The vaccine is not recommended for use in pregnant females.  However, pregnancy testing is not needed before receiving a dose. Measles, mumps, rubella (MMR) vaccine.** / You need at least 1 dose of MMR if you were born in 1955 or later.  You may also need a 2nd dose. For females of childbearing age, rubella immunity should be determined. If there is  no evidence of immunity, females who are not pregnant should be vaccinated. If there is no evidence of immunity,  females who are pregnant should delay immunization until after pregnancy. Pneumococcal 13-valent  conjugate (PCV13) vaccine.** / Consult your health care provider. Pneumococcal polysaccharide (PPSV23) vaccine.** / 1 to 2 doses if you smoke cigarettes or if you have certain conditions. Meningococcal vaccine.** / 1 dose if you are age 15 to 43 years and a Orthoptist living in a residence hall  or  have one of several medical conditions, you need to get vaccinated against meningococcal disease. You may also need additional booster doses. Hepatitis A vaccine.** / Consult your health care provider. Hepatitis B vaccine.** / Consult your health care provider. Haemophilus influenzae type b (Hib) vaccine.** / Consult your health care provider. +++++++++++++++++++++++++++++++ Recommend Adult Low Dose Aspirin or  coated  Aspirin 81 mg daily  To reduce risk of Colon Cancer 40 %,  Skin Cancer 26 % ,  Melanoma 46%  and  Pancreatic cancer 60% +++++++++++++++++++++++++++++++ Vitamin D goal  is between 70-100.  Please make sure that you are taking your Vitamin D as directed.  It is very important as a natural anti-inflammatory  helping hair, skin, and nails, as well as reducing stroke and heart attack risk.  It helps your bones and helps with mood. It also decreases numerous cancer risks so please take it as directed.  Low Vit D is associated with a 200-300% higher risk for CANCER  and 200-300% higher risk for HEART   ATTACK  &  STROKE.   .....................................Marland Kitchen It is also associated with higher death rate at younger ages,  autoimmune diseases like Rheumatoid arthritis, Lupus, Multiple Sclerosis.    Also many other serious conditions, like depression, Alzheimer's Dementia, infertility, muscle aches, fatigue, fibromyalgia - just to name a few. ++++++++++++++++++++ Recommend the book "The END of DIETING" by Dr Monico Hoar  & the book "The END of DIABETES " by Dr Monico Hoar At [email or web address] - get book & Audio CD's    Being diabetic has a  300% increased risk for heart  attack, stroke, cancer, and alzheimer- type vascular dementia. It is very important that you work harder with diet by avoiding all foods that are white. Avoid white rice (brown & wild rice is OK), white potatoes (sweetpotatoes in moderation is OK), White bread or wheat bread or anything made out of white flour like bagels, donuts, rolls, buns, biscuits, cakes, pastries, cookies, pizza crust, and pasta (made from white flour & egg whites) - vegetarian pasta or spinach or wheat pasta is OK. Multigrain breads like Arnold's or Pepperidge Farm, or multigrain sandwich thins or flatbreads.  Diet, exercise and weight loss can reverse and cure diabetes in the early stages.  Diet, exercise and weight loss is very important in the control and prevention of complications of diabetes which affects every system in your body, ie. Brain - dementia/stroke, eyes - glaucoma/blindness, heart - heart attack/heart failure, kidneys - dialysis, stomach - gastric paralysis, intestines - malabsorption, nerves - severe painful neuritis, circulation - gangrene & loss of a leg(s), and finally cancer and Alzheimers.    I recommend avoid fried & greasy foods,  sweets/candy, white rice (brown or wild rice or Quinoa is OK), white potatoes (sweet potatoes are OK) - anything made from white flour - bagels, doughnuts, rolls, buns, biscuits,white and wheat breads, pizza crust and traditional pasta made of white flour & egg white(vegetarian pasta or spinach or wheat pasta is OK).  Multi-grain bread is OK - like multi-grain flat bread or sandwich thins. Avoid alcohol in excess. Exercise is also important.    Eat all the vegetables you want - avoid meat, especially red meat and dairy - especially cheese.  Cheese is the most concentrated form of trans-fats which is the worst thing to clog up our arteries. Veggie cheese is OK which can be found in the fresh produce section at [employer] or [employer] or [employer]  ++++++++++++++++++++ DASH Eating  Plan  DASH stands for "Dietary Approaches to Stop Hypertension."   The DASH eating plan is a healthy eating plan that has been shown to reduce high blood pressure (hypertension). Additional health benefits may include reducing the risk of type 2 diabetes mellitus, heart disease, and stroke. The DASH eating plan may also help with weight loss. WHAT DO I NEED TO KNOW ABOUT THE DASH EATING PLAN? For the DASH eating plan, you will follow these general guidelines: Choose foods with a percent daily value for sodium of less than 5% (as listed on the food label). Use salt-free seasonings or herbs instead of table salt or sea salt. Check with your health care provider or pharmacist before using salt substitutes. Eat lower-sodium products, often labeled as "lower sodium" or "no salt added." Eat fresh foods. Eat more vegetables, fruits, and low-fat dairy products. Choose whole grains. Look for the word "whole" as the first word in the ingredient list. Choose fish  Limit sweets, desserts, sugars, and sugary drinks. Choose heart-healthy fats. Eat veggie cheese  Eat more home-cooked food and less restaurant, buffet, and fast food. Limit fried foods. Cook foods using methods other than frying. Limit canned vegetables. If you do use them, rinse them well to decrease the sodium. When eating at a restaurant, ask that your food be prepared with less salt, or no salt if possible.                      WHAT FOODS CAN I EAT? Read Dr Francis Dowse Fuhrman's books on The End of Dieting & The End of Diabetes  Grains Whole grain or whole wheat bread. Brown rice. Whole grain or whole wheat pasta. Quinoa, bulgur, and whole grain cereals. Low-sodium cereals. Corn or whole wheat flour tortillas. Whole grain cornbread. Whole grain crackers. Low-sodium crackers.  Vegetables Fresh or frozen vegetables (raw, steamed, roasted, or grilled). Low-sodium or reduced-sodium tomato and vegetable juices. Low-sodium or reduced-sodium  tomato sauce and paste. Low-sodium or reduced-sodium canned vegetables.   Fruits All fresh, canned (in natural juice), or frozen fruits.  Protein Products  All fish and seafood.  Dried beans, peas, or lentils. Unsalted nuts and seeds. Unsalted canned beans.  Dairy Low-fat dairy products, such as skim or 1% milk,  2% or reduced-fat cheeses, low-fat ricotta or cottage cheese, or plain low-fat yogurt. Low-sodium or reduced-sodium cheeses.  Fats and Oils Tub margarines without trans fats. Light or reduced-fat mayonnaise and salad dressings (reduced sodium). Avocado. Safflower, olive, or canola oils. Natural peanut or almond butter.  Other Unsalted popcorn and pretzels. The items listed above may not be a complete list of recommended foods or beverages. Contact your dietitian for more options.  +++++++++++++++++++  WHAT FOODS ARE NOT RECOMMENDED? Grains/ White flour or wheat flour White bread. White pasta. White rice. Refined cornbread. Bagels and croissants. Crackers that contain trans fat.  Vegetables  Creamed or fried vegetables. Vegetables in a . Regular canned vegetables. Regular canned tomato sauce and paste. Regular tomato and vegetable juices.  Fruits Dried fruits. Canned fruit in light or heavy syrup. Fruit juice.  Meat and Other Protein Products Meat in general - RED meat & White meat.  Fatty cuts of meat. Ribs, chicken wings, all processed meats as bacon, sausage, bologna, salami, fatback, hot dogs, bratwurst and packaged luncheon meats.  Dairy Whole or 2% milk, cream, half-and-half, and cream cheese. Whole-fat or sweetened yogurt. Full-fat cheeses or blue cheese. Non-dairy creamers and whipped toppings. Processed cheese, cheese spreads, or cheese curds.  Condiments Onion and garlic salt, seasoned salt, table salt, and sea salt. Canned and packaged gravies. Worcestershire sauce. Tartar sauce. Barbecue sauce. Teriyaki sauce. Soy sauce, including reduced sodium. Steak sauce.  Fish sauce. Oyster sauce. Cocktail sauce. Horseradish. Ketchup and mustard. Meat flavorings and tenderizers. Bouillon cubes. Hot sauce. Tabasco sauce. Marinades. Taco seasonings. Relishes.  Fats and Oils Butter, stick margarine, lard, shortening and bacon fat. Coconut, palm kernel, or palm oils. Regular salad dressings.  Pickles and olives. Salted popcorn and pretzels.  The items listed above may not be a complete list of foods and beverages to avoid.

## 2023-09-22 ENCOUNTER — Encounter: Payer: Self-pay | Admitting: Internal Medicine

## 2023-09-22 ENCOUNTER — Ambulatory Visit (INDEPENDENT_AMBULATORY_CARE_PROVIDER_SITE_OTHER): Payer: Medicare Other | Admitting: Internal Medicine

## 2023-09-22 VITALS — BP 136/80 | HR 90 | Temp 97.9°F | Resp 16 | Ht 68.25 in | Wt 197.0 lb

## 2023-09-22 DIAGNOSIS — Z8249 Family history of ischemic heart disease and other diseases of the circulatory system: Secondary | ICD-10-CM

## 2023-09-22 DIAGNOSIS — Z136 Encounter for screening for cardiovascular disorders: Secondary | ICD-10-CM | POA: Diagnosis not present

## 2023-09-22 DIAGNOSIS — Z79899 Other long term (current) drug therapy: Secondary | ICD-10-CM

## 2023-09-22 DIAGNOSIS — I10 Essential (primary) hypertension: Secondary | ICD-10-CM | POA: Diagnosis not present

## 2023-09-22 DIAGNOSIS — R7309 Other abnormal glucose: Secondary | ICD-10-CM

## 2023-09-22 DIAGNOSIS — N183 Chronic kidney disease, stage 3 unspecified: Secondary | ICD-10-CM

## 2023-09-22 DIAGNOSIS — Z1211 Encounter for screening for malignant neoplasm of colon: Secondary | ICD-10-CM

## 2023-09-22 DIAGNOSIS — E782 Mixed hyperlipidemia: Secondary | ICD-10-CM

## 2023-09-22 DIAGNOSIS — Z23 Encounter for immunization: Secondary | ICD-10-CM

## 2023-09-22 DIAGNOSIS — E559 Vitamin D deficiency, unspecified: Secondary | ICD-10-CM

## 2023-09-22 DIAGNOSIS — K219 Gastro-esophageal reflux disease without esophagitis: Secondary | ICD-10-CM

## 2023-09-22 DIAGNOSIS — K7581 Nonalcoholic steatohepatitis (NASH): Secondary | ICD-10-CM

## 2023-09-22 DIAGNOSIS — Z0000 Encounter for general adult medical examination without abnormal findings: Secondary | ICD-10-CM

## 2023-09-22 DIAGNOSIS — Z0001 Encounter for general adult medical examination with abnormal findings: Secondary | ICD-10-CM

## 2023-09-22 DIAGNOSIS — Z87891 Personal history of nicotine dependence: Secondary | ICD-10-CM

## 2023-09-22 MED ORDER — TOPIRAMATE 50 MG PO TABS
ORAL_TABLET | ORAL | 1 refills | Status: AC
Start: 2023-09-22 — End: ?

## 2023-09-22 MED ORDER — PHENTERMINE HCL 37.5 MG PO TABS: ORAL_TABLET | ORAL | 1 refills | Status: AC

## 2023-09-23 ENCOUNTER — Other Ambulatory Visit: Payer: Self-pay | Admitting: Nurse Practitioner

## 2023-09-23 DIAGNOSIS — F325 Major depressive disorder, single episode, in full remission: Secondary | ICD-10-CM

## 2023-09-23 LAB — COMPLETE METABOLIC PANEL WITH GFR
AG Ratio: 2 (calc) (ref 1.0–2.5)
ALT: 22 U/L (ref 6–29)
AST: 19 U/L (ref 10–35)
Albumin: 4.7 g/dL (ref 3.6–5.1)
Alkaline phosphatase (APISO): 67 U/L (ref 37–153)
BUN/Creatinine Ratio: 17 (calc) (ref 6–22)
BUN: 21 mg/dL (ref 7–25)
CO2: 26 mmol/L (ref 20–32)
Calcium: 10 mg/dL (ref 8.6–10.4)
Chloride: 106 mmol/L (ref 98–110)
Creat: 1.22 mg/dL — ABNORMAL HIGH (ref 0.60–1.00)
Globulin: 2.3 g/dL (calc) (ref 1.9–3.7)
Glucose, Bld: 88 mg/dL (ref 65–99)
Potassium: 4 mmol/L (ref 3.5–5.3)
Sodium: 142 mmol/L (ref 135–146)
Total Bilirubin: 0.4 mg/dL (ref 0.2–1.2)
Total Protein: 7 g/dL (ref 6.1–8.1)
eGFR: 48 mL/min/{1.73_m2} — ABNORMAL LOW (ref >=60)

## 2023-09-23 LAB — VITAMIN D 25 HYDROXY (VIT D DEFICIENCY, FRACTURES): Vit D, 25-Hydroxy: 56 ng/mL (ref 30–100)

## 2023-09-23 LAB — CBC WITH DIFFERENTIAL/PLATELET
Absolute Monocytes: 1003 cells/uL — ABNORMAL HIGH (ref 200–950)
Basophils Absolute: 92 cells/uL (ref 0–200)
Basophils Relative: 1 %
Eosinophils Absolute: 147 cells/uL (ref 15–500)
Eosinophils Relative: 1.6 %
HCT: 42.9 % (ref 35.0–45.0)
Hemoglobin: 13.7 g/dL (ref 11.7–15.5)
Lymphs Abs: 1914 cells/uL (ref 850–3900)
MCH: 30.2 pg (ref 27.0–33.0)
MCHC: 31.9 g/dL — ABNORMAL LOW (ref 32.0–36.0)
MCV: 94.5 fL (ref 80.0–100.0)
MPV: 9.9 fL (ref 7.5–12.5)
Monocytes Relative: 10.9 %
Neutro Abs: 6044 cells/uL (ref 1500–7800)
Neutrophils Relative %: 65.7 %
Platelets: 233 10*3/uL (ref 140–400)
RBC: 4.54 10*6/uL (ref 3.80–5.10)
RDW: 12.5 % (ref 11.0–15.0)
Total Lymphocyte: 20.8 %
WBC: 9.2 10*3/uL (ref 3.8–10.8)

## 2023-09-23 LAB — HEMOGLOBIN A1C
Hgb A1c MFr Bld: 5.9 % of total Hgb — ABNORMAL HIGH (ref <5.7)
Mean Plasma Glucose: 123 mg/dL
eAG (mmol/L): 6.8 mmol/L

## 2023-09-23 LAB — URINALYSIS, ROUTINE W REFLEX MICROSCOPIC
Bilirubin Urine: NEGATIVE
Glucose, UA: NEGATIVE
Hgb urine dipstick: NEGATIVE
Ketones, ur: NEGATIVE
Leukocytes,Ua: NEGATIVE
Nitrite: NEGATIVE
Protein, ur: NEGATIVE
Specific Gravity, Urine: 1.009 (ref 1.001–1.035)
pH: 5.5 (ref 5.0–8.0)

## 2023-09-23 LAB — LIPID PANEL
Cholesterol: 199 mg/dL (ref <200)
HDL: 95 mg/dL (ref >=50)
LDL Cholesterol (Calc): 82 mg/dL (calc)
Non-HDL Cholesterol (Calc): 104 mg/dL (calc) (ref <130)
Total CHOL/HDL Ratio: 2.1 (calc) (ref <5.0)
Triglycerides: 122 mg/dL (ref <150)

## 2023-09-23 LAB — INSULIN, RANDOM: Insulin: 21 u[IU]/mL — ABNORMAL HIGH

## 2023-09-23 LAB — MICROALBUMIN / CREATININE URINE RATIO
Creatinine, Urine: 55 mg/dL (ref 20–275)
Microalb Creat Ratio: 5 mg/g creat (ref <30)
Microalb, Ur: 0.3 mg/dL

## 2023-09-23 LAB — TSH: TSH: 0.85 mIU/L (ref 0.40–4.50)

## 2023-09-23 LAB — MAGNESIUM: Magnesium: 2 mg/dL (ref 1.5–2.5)

## 2023-09-23 NOTE — Progress Notes (Signed)
<>*<>*<>*<>*<>*<>*<>*<>*<>*<>*<>*<>*<>*<>*<>*<>*<>*<>*<>*<>*<>*<>*<>*<>*<> <>*<>*<>*<>*<>*<>*<>*<>*<>*<>*<>*<>*<>*<>*<>*<>*<>*<>*<>*<>*<>*<>*<>*<>*<>  -  Test results slightly outside the reference range are not unusual. If there is anything important, I will review this with you,  otherwise it is considered normal test values.  If you have further questions,  please do not hesitate to contact me at the office or via My Chart.   <>*<>*<>*<>*<>*<>*<>*<>*<>*<>*<>*<>*<>*<>*<>*<>*<>*<>*<>*<>*<>*<>*<>*<>*<> <>*<>*<>*<>*<>*<>*<>*<>*<>*<>*<>*<>*<>*<>*<>*<>*<>*<>*<>*<>*<>*<>*<>*<>*<>  -  A1c = 5.9% - still borderline elevated blood sugar, So   - Avoid Sweets, Candy & White Stuff   - White Rice, White [place], [place] Flour  - Breads &  Pasta  <>*<>*<>*<>*<>*<>*<>*<>*<>*<>*<>*<>*<>*<>*<>*<>*<>*<>*<>*<>*<>*<>*<>*<>*<> <>*<>*<>*<>*<>*<>*<>*<>*<>*<>*<>*<>*<>*<>*<>*<>*<>*<>*<>*<>*<>*<>*<>*<>*<>  -  Kidney Functions Stable   - Great !   <>*<>*<>*<>*<>*<>*<>*<>*<>*<>*<>*<>*<>*<>*<>*<>*<>*<>*<>*<>*<>*<>*<>*<>*<> <>*<>*<>*<>*<>*<>*<>*<>*<>*<>*<>*<>*<>*<>*<>*<>*<>*<>*<>*<>*<>*<>*<>*<>*<>  -  Vitamin   D = 56 - Great - Please keep dosage same   <>*<>*<>*<>*<>*<>*<>*<>*<>*<>*<>*<>*<>*<>*<>*<>*<>*<>*<>*<>*<>*<>*<>*<>*<> <>*<>*<>*<>*<>*<>*<>*<>*<>*<>*<>*<>*<>*<>*<>*<>*<>*<>*<>*<>*<>*<>*<>*<>*<>  -  Chol = 199   &   LDL = 82  - Both  Excellent   - Very low risk for Heart Attack  / Stroke  <>*<>*<>*<>*<>*<>*<>*<>*<>*<>*<>*<>*<>*<>*<>*<>*<>*<>*<>*<>*<>*<>*<>*<>*<> <>*<>*<>*<>*<>*<>*<>*<>*<>*<>*<>*<>*<>*<>*<>*<>*<>*<>*<>*<>*<>*<>*<>*<>*<>  -  All Else - CBC - Kidneys - Electrolytes - Liver - Magnesium & Thyroid    - all  Normal / OK  <>*<>*<>*<>*<>*<>*<>*<>*<>*<>*<>*<>*<>*<>*<>*<>*<>*<>*<>*<>*<>*<>*<>*<>*<> <>*<>*<>*<>*<>*<>*<>*<>*<>*<>*<>*<>*<>*<>*<>*<>*<>*<>*<>*<>*<>*<>*<>*<>*<>

## 2023-10-06 ENCOUNTER — Encounter: Payer: Self-pay | Admitting: Internal Medicine

## 2023-10-06 DIAGNOSIS — E079 Disorder of thyroid, unspecified: Secondary | ICD-10-CM

## 2023-10-06 MED ORDER — LEVOTHYROXINE SODIUM 100 MCG PO TABS: ORAL_TABLET | ORAL | 2 refills | Status: DC

## 2023-10-08 ENCOUNTER — Other Ambulatory Visit: Payer: Self-pay

## 2023-10-08 DIAGNOSIS — E079 Disorder of thyroid, unspecified: Secondary | ICD-10-CM

## 2023-10-08 MED ORDER — LEVOTHYROXINE SODIUM 100 MCG PO TABS: ORAL_TABLET | ORAL | 2 refills | Status: DC

## 2023-10-10 ENCOUNTER — Other Ambulatory Visit: Payer: Self-pay | Admitting: Nurse Practitioner

## 2023-10-10 ENCOUNTER — Encounter: Payer: Self-pay | Admitting: Internal Medicine

## 2023-10-10 DIAGNOSIS — E079 Disorder of thyroid, unspecified: Secondary | ICD-10-CM

## 2023-10-10 MED ORDER — LEVOTHYROXINE SODIUM 100 MCG PO TABS
ORAL_TABLET | ORAL | 2 refills | Status: AC
Start: 2023-10-10 — End: ?

## 2023-12-30 ENCOUNTER — Ambulatory Visit: Payer: Medicare Other | Admitting: Nurse Practitioner

## 2024-03-22 ENCOUNTER — Ambulatory Visit: Payer: Medicare Other | Admitting: Nurse Practitioner

## 2024-03-29 ENCOUNTER — Ambulatory Visit: Payer: Medicare Other | Admitting: Nurse Practitioner

## 2024-06-29 ENCOUNTER — Ambulatory Visit: Payer: Medicare Other | Admitting: Internal Medicine

## 2024-10-04 ENCOUNTER — Encounter: Payer: Medicare Other | Admitting: Internal Medicine
